# Patient Record
Sex: Male | Born: 1950
Health system: Southern US, Community
[De-identification: ages and names within clinical notes are randomized; demographics above are authoritative.]

## PROBLEM LIST (undated history)

## (undated) DIAGNOSIS — K219 Gastro-esophageal reflux disease without esophagitis: Secondary | ICD-10-CM

## (undated) DIAGNOSIS — N529 Male erectile dysfunction, unspecified: Secondary | ICD-10-CM

## (undated) DIAGNOSIS — I10 Essential (primary) hypertension: Secondary | ICD-10-CM

## (undated) DIAGNOSIS — E785 Hyperlipidemia, unspecified: Secondary | ICD-10-CM

## (undated) DIAGNOSIS — J302 Other seasonal allergic rhinitis: Secondary | ICD-10-CM

## (undated) DIAGNOSIS — G4730 Sleep apnea, unspecified: Secondary | ICD-10-CM

## (undated) DIAGNOSIS — E119 Type 2 diabetes mellitus without complications: Secondary | ICD-10-CM

## (undated) HISTORY — DX: Essential (primary) hypertension: I10

## (undated) HISTORY — DX: Hyperlipidemia, unspecified: E78.5

## (undated) HISTORY — PX: COLON SURGERY: SHX602

---

## 2000-09-09 ENCOUNTER — Encounter: Payer: Self-pay | Admitting: General Surgery

## 2000-09-09 ENCOUNTER — Inpatient Hospital Stay (HOSPITAL_COMMUNITY): Admission: EM | Admit: 2000-09-09 | Discharge: 2000-09-16 | Payer: Self-pay | Admitting: Emergency Medicine

## 2000-11-25 ENCOUNTER — Encounter: Payer: Self-pay | Admitting: General Surgery

## 2000-11-25 ENCOUNTER — Ambulatory Visit (HOSPITAL_COMMUNITY): Admission: RE | Admit: 2000-11-25 | Discharge: 2000-11-25 | Payer: Self-pay | Admitting: General Surgery

## 2001-03-10 ENCOUNTER — Encounter: Payer: Self-pay | Admitting: General Surgery

## 2001-03-10 ENCOUNTER — Ambulatory Visit (HOSPITAL_COMMUNITY): Admission: RE | Admit: 2001-03-10 | Discharge: 2001-03-10 | Payer: Self-pay | Admitting: General Surgery

## 2001-04-15 ENCOUNTER — Encounter: Payer: Self-pay | Admitting: General Surgery

## 2001-04-20 ENCOUNTER — Inpatient Hospital Stay (HOSPITAL_COMMUNITY): Admission: RE | Admit: 2001-04-20 | Discharge: 2001-04-28 | Payer: Self-pay | Admitting: General Surgery

## 2002-07-30 ENCOUNTER — Emergency Department (HOSPITAL_COMMUNITY): Admission: EM | Admit: 2002-07-30 | Discharge: 2002-07-30 | Payer: Self-pay | Admitting: Emergency Medicine

## 2002-07-30 ENCOUNTER — Encounter: Payer: Self-pay | Admitting: Emergency Medicine

## 2003-01-16 ENCOUNTER — Encounter: Admission: RE | Admit: 2003-01-16 | Discharge: 2003-04-16 | Payer: Self-pay | Admitting: Internal Medicine

## 2003-07-03 ENCOUNTER — Encounter: Admission: RE | Admit: 2003-07-03 | Discharge: 2003-10-01 | Payer: Self-pay | Admitting: Internal Medicine

## 2003-12-21 ENCOUNTER — Ambulatory Visit (HOSPITAL_COMMUNITY): Admission: RE | Admit: 2003-12-21 | Discharge: 2003-12-21 | Payer: Self-pay | Admitting: Internal Medicine

## 2006-03-26 ENCOUNTER — Ambulatory Visit (HOSPITAL_COMMUNITY): Admission: RE | Admit: 2006-03-26 | Discharge: 2006-03-26 | Payer: Self-pay | Admitting: Cardiology

## 2006-05-25 ENCOUNTER — Ambulatory Visit: Payer: Self-pay | Admitting: Pulmonary Disease

## 2006-07-16 ENCOUNTER — Ambulatory Visit: Payer: Self-pay | Admitting: Pulmonary Disease

## 2006-10-01 ENCOUNTER — Ambulatory Visit: Payer: Self-pay | Admitting: Vascular Surgery

## 2007-07-09 ENCOUNTER — Emergency Department (HOSPITAL_COMMUNITY): Admission: EM | Admit: 2007-07-09 | Discharge: 2007-07-09 | Payer: Self-pay | Admitting: Emergency Medicine

## 2010-04-21 ENCOUNTER — Emergency Department (HOSPITAL_COMMUNITY): Admission: EM | Admit: 2010-04-21 | Discharge: 2010-04-21 | Payer: Self-pay | Admitting: Emergency Medicine

## 2011-01-02 NOTE — Assessment & Plan Note (Signed)
[facility]                               PULMONARY OFFICE NOTE   Howard, Simmons                      MRN:          [id number]  DATE:05/25/2006                            DOB:          07-Jun-1951    HISTORY OF PRESENT ILLNESS:  I met with Howard Simmons today with his wife for  evaluation of his obstructive sleep apnea.  He apparently had undergone 2  overnight polysomnograms in August of 2007.  Per a copy of his report, his  initial diagnostic study showed an overall apnea/hypopnea index of 8.6 with  a REM apnea/hypopnea index of 19 and a supine apnea/hypopnea index of 80.  He subsequently underwent a C-PAP titration study.  It was titrated to a C-  PAP pressure setting of 8, but then apparently he now has had arrangements  to have a auto C-PAP titration study done.  He was initially referred for  his sleep study as part of an evaluation for chest discomfort as well as  daytime fatigue.  His current sleep pattern is that he will go to bed  between 8 and 10 o'clock at night.  He falls asleep fairly quickly, although  he wakes up 2-3 times in the night and he get up about 4:30 or 5 o'clock in  the morning.  He says he still feels quite tired during the day and he has  had instances where he actually felt sleepy while driving.  He also  complained of having  headache in the morning.  He has been told that he  snores at night as well as stops breathing while he is asleep.  He tends to  be more of a mouth breather and does get a dry mouth at night.  He has also  had instances in which he has actually fallen asleep while at work and then  actually falls asleep while eating dinner as well.  He drinks one cup of  coffee in the morning as well as one cup of coffee in the afternoon.  He has  been initiated on C-PAP therapy, using nasal pillows and a heated  humidifier.  He says that since they started on C-PAP he has noticed that  his sleep quality is  better at night and he has more energy during the day.  He denies any history of sleep walk or sleep talking, nightmares, night  terrors.  He also denies any history of sleep hallucinations, sleep  paralysis or cataplexy.  He is not currently using anything to help him fall  asleep at night.  He does complain of having occasional leg cramps.  About 2-  3 times per week he gets an odd feeling in his legs which happens on a  consistent basis while he is trying to lay flat to fall asleep.  He says  that if he moves his legs into different positions eventually these symptoms  will get better.   PAST MEDICAL HISTORY:  Otherwise is significant for atypical chest pain,  exertional dyspnea, and possible COPD, diabetes, hypertension,  hyperlipidemia, allergic rhinitis, anxiety and depression,  diverticulitis  with a rupture of the sigmoid colon resulting in temporary colostomy,  chronic left later epicondylitis, erectile dysfunction, chromomycosis, and a  history of alcohol abuse.  He has also had sinus surgery for nasal polyposis  and he has had knee surgery.   CURRENT MEDICATIONS:  1. __________  50 mg daily.  2. Zocor 40 mg daily.  3. Aciphex 20 mg daily.  4. Zyrtec 10 mg daily.  5. Citalopram 40 mg q.h.s.  6. Advair 250/50 1 puff b.i.d.  7. Flonase and Nasonex in addition to having a Proventil inhaler.   FAMILY HISTORY:  Significant for his father who had a brain tumor, mother  had heart disease.  His brother is diabetic.  His sister has breast cancer,  and he has a brother who has a history of alcohol abuse and cirrhosis.   SOCIAL HISTORY:  He is married.  He quit smoking approximately 10 years ago.  He smoked about 2 packs of cigarettes per day for 27 years.  He used to  drink heavily but quit approximately 26 years ago.  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   REVIEW OF SYSTEMS:  Essentially negative except for what is stated above.   PHYSICAL EXAMINATION:  He  is 6 feet 2 inches tall, weight is 230 pounds,  temperature is 98, blood pressure is 110/70, heart rate is 54, oxygen  saturation is 97% on room air.  HEENT:  Pupils are reactive.  Then is no sinus tenderness.  There is no  lymphadenopathy.  He has a Mallampati free airway.  HEART:  S1, S2 with a 2/6 systolic murmur.  CHEST:  Clear to auscultation.  ABDOMEN:  Obese, soft and tender.  EXTREMITIES:  No edema.  NEUROLOGIC:  There were no focal deficits appreciated.   IMPRESSION:  1. Mild obstructive sleep apnea.  Although he appeared to have a      significant REM as well as positional component, he does also have      significant symptoms suggestive of daytime sleepiness.  Given the fact      that he does also have a history of hypertension and diabetes, I do      feel that it would be warranted for him to undergo continued therapy      with C-PAP.  In the meantime, I discussed with him the importance of      diet, exercise, weight reduction as well as the influence of alcohol      and sedatives.  Driving precautions were discussed with him as well.  I      reviewed other treatment options for his sleep apnea, including oral      appliance and surgical intervention, however at this time I would      continue him on C-PAP therapy and monitor his compliance to this.  2. Symptoms suggestive of restless leg syndrome.  To further evaluate      this, I will have him undergo a laboratory assessment to check for his      serum ferritin level and if this is below 50 I would start him on iron      supplementation with vitamin C.  If this is normal, then what I would      recommend is consideration for possibly adding a dopamine agonist      medication to see if this helps with his leg symptoms and therefore      hopefully improve his quality of sleep  at night and functionality      during the day.  I plan on following up with him in approximately 6-     8 weeks to assess his tolerance of C-PAP  therapy.  Additionally, I will      try to obtain a copy of his auto C-PAP titration download from [facility].       Coralyn Helling, MD      VS/MedQ  DD:  05/31/2006  DT:  06/01/2006  Job #:  [id number]   cc:   Armanda Magic, M.D.

## 2011-01-02 NOTE — Assessment & Plan Note (Signed)
[facility]                             PULMONARY OFFICE NOTE   CONCEPCION, KIRKPATRICK                      MRN:          [id number]  DATE:07/16/2006                            DOB:          [birth year]    I saw Mr. Cerreta today in followup for his obstructive sleep apnea and  restless leg syndrome.   He appears to be doing quite well with regards to his sleep apnea.  He  is currently on CPAP of 8 with nasal pillows and heated humidification.  Since having proper fitting of his chin strap, his symptoms of mouth  dryness have improved considerably.  He no longer is snoring.  His sleep  quality is better, and he feels like he has more energy during the day.   With regards to his restless leg syndrome, he feels that the Requip has  helped this.  He is currently on 0.5 mg of Requip at night.  Of note, is  that he actually ran out of his medicine for about 1-1/2 weeks, and  during that time he noticed that his leg symptoms became significantly  worse.  He has since refilled his prescription, and continues to be on  the Requip at the present time.   Additionally, he had an influenza vaccination with his primary care  physician earlier this month.   CURRENT MEDICATIONS:  1. Tenormin 50 mg daily.  2. Simvastatin 20 mg daily.  3. AcipHex 20 mg daily.  4. Zyrtec 10 mg daily.  5. Citalopram 20 mg daily.  6. Advair 250/50 one puff b.i.d.  7. Flonase.  8. Aspirin 81 mg daily.  9. Requip 0.5 mg nightly.  10.Proventil p.r.n.   PHYSICAL EXAMINATION:  He weighs 239 pounds.  Temperature is 98.  Blood  pressure is 126/74.  Heart rate is 63.  Oxygen saturation is 97% on room  air.  HEENT:  There is no sinus tenderness.  No nasal discharge.  No oral  lesions.  HEART:  S1 and S2.  CHEST:  Clear to auscultation.  ABDOMEN:  Soft and non-tender.  EXTREMITIES:  No edema.   IMPRESSION:  1. Mild obstructive sleep apnea with a significant REM and positional  affect.  He is currently doing quite well on his CPAP set at 8 cm      of water with nasal pillow and a chin strap.  I have encouraged him      to maintain his compliance with CPAP therapy.  2. Restless leg syndrome.  He again, seems to have symptomatic benefit      with the use of Requip.  I advised him that he is a relatively low      dose of Requip, and that if his leg symptoms were to worsen, that      he could certainly try to increase his dose.  Otherwise, I would      continue him on Requip at 0.5 mg nightly.  3. I have given him samples of Nasonex, Advair and AcipHex from our      office today.  4. Followup will be  in 6 months.     Coralyn Helling, MD  Electronically Signed    VS/MedQ  DD: 07/16/2006  DT: 07/17/2006  Job #: [id number]   cc:   Armanda Magic, M.D.

## 2011-01-02 NOTE — Assessment & Plan Note (Signed)
[facility]                               PULMONARY OFFICE NOTE   MINAS, BONSER                      MRN:          [id number]  DATE:05/31/2006                            DOB:          1950/09/28    SLEEP MEDICINE NOTE   I have reviewed the laboratory results for Mr. Tal and, except for a  slight elevation in his glucose to 107 and a slight elevation in his AST to  43, the remainder of his labs were essentially normal.  I discussed these  results with his wife.  He is still having significant leg symptoms.  Therefore, I will initiate him on Requip starting at 0.25 mg nightly and  titrating the dose upwards as needed until he has either symptomatic  improvement in his legs or he starts noticing side effects.  I discussed the  possible side effects with his wife, and I have advised her that if he is  having any difficulties that he should call my office for further  discussion.  Otherwise, I will plan on seeing him back here in approximately  6 weeks.       Coralyn Helling, MD      VS/MedQ  DD:  05/31/2006  DT:  06/01/2006  Job #:  [id number]   cc:   Patrina Levering

## 2013-06-23 ENCOUNTER — Ambulatory Visit
Admission: RE | Admit: 2013-06-23 | Discharge: 2013-06-23 | Disposition: A | Discharge status: Home / Self Care | Payer: BC Managed Care – PPO | Source: Ambulatory Visit | Attending: Family Medicine | Admitting: Family Medicine

## 2013-06-23 ENCOUNTER — Other Ambulatory Visit: Payer: Self-pay | Admitting: Family Medicine

## 2013-06-23 DIAGNOSIS — M545 Low back pain, unspecified: Secondary | ICD-10-CM

## 2013-06-23 DIAGNOSIS — R634 Abnormal weight loss: Secondary | ICD-10-CM

## 2014-03-22 ENCOUNTER — Ambulatory Visit
Admission: RE | Admit: 2014-03-22 | Discharge: 2014-03-22 | Disposition: A | Discharge status: Home / Self Care | Payer: BC Managed Care – PPO | Source: Ambulatory Visit | Attending: Family Medicine | Admitting: Family Medicine

## 2014-03-22 ENCOUNTER — Other Ambulatory Visit: Payer: Self-pay | Admitting: Family Medicine

## 2014-03-22 DIAGNOSIS — M79672 Pain in left foot: Secondary | ICD-10-CM

## 2015-08-26 ENCOUNTER — Ambulatory Visit (INDEPENDENT_AMBULATORY_CARE_PROVIDER_SITE_OTHER): Payer: Worker's Compensation | Admitting: Family Medicine

## 2015-08-26 ENCOUNTER — Ambulatory Visit: Payer: Worker's Compensation

## 2015-08-26 VITALS — BP 116/80 | HR 94 | Temp 97.7°F | Resp 18 | Ht 71.5 in | Wt 224.8 lb

## 2015-08-26 DIAGNOSIS — S20229A Contusion of unspecified back wall of thorax, initial encounter: Secondary | ICD-10-CM

## 2015-08-26 DIAGNOSIS — M545 Low back pain, unspecified: Secondary | ICD-10-CM

## 2015-08-26 DIAGNOSIS — M546 Pain in thoracic spine: Secondary | ICD-10-CM | POA: Diagnosis not present

## 2015-08-26 MED ORDER — HYDROCODONE-ACETAMINOPHEN 5-325 MG PO TABS: 1.0000 | ORAL_TABLET | Freq: Four times a day (QID) | ORAL | Status: DC | PRN

## 2015-08-26 MED ORDER — CYCLOBENZAPRINE HCL 5 MG PO TABS: 5.0000 mg | ORAL_TABLET | Freq: Three times a day (TID) | ORAL | Status: DC | PRN

## 2015-08-26 MED ORDER — MELOXICAM 15 MG PO TABS: 15.0000 mg | ORAL_TABLET | Freq: Every day | ORAL | Status: DC

## 2015-08-26 NOTE — Patient Instructions (Signed)
Use mobic daily. Do not use any other products with this containing ibuprofen, naprosyn or aspirin. You MAY use tylenol with this. Use norco for breakthrough pain as needed. Flexeril three times a day as needed - this is the muscle relaxer. This will make you sleepy. Don't drive on this medication. Heat, gentle massage and gentle stretching can help. Remain active, as inactivity can cause more pain. Don't do anything too strenuous though. If you develop new numbness, weakness or incontinence of urine or bowel, return to clinic or go to the emergency room ASAP. Return in 1 week for follow up.

## 2015-08-26 NOTE — Progress Notes (Signed)
Patient ID: Howard Simmons, male   DOB: Feb 03, 1951, 65 y.o.   MRN: [id number] Reviewed documentation and xray and agree w/ assessment and plan. Delman Cheadle, MD MPH

## 2015-08-26 NOTE — Progress Notes (Signed)
[facility] [address] [place] [address] [phone]  Date:  08/26/2015   Name:  Howard Simmons   DOB:  [birth year]   MRN:  [id number]  PCP:  No primary care provider on file.    Chief Complaint: Back Pain and Chest Pain   History of Present Illness:  This is a 65 y.o. male who is presenting with back and chest pain after slipping and falling on ice today while at work. He states he landed right on his back. Did not hit his head. Pain mostly in thoracic back but in lower back as well. He states he tried to get up and keep working but after about an hour decided he needed to leave. He went home and took a left over hydrocodone from 2014 but states it did not help at all. He denies weakness, paresthesias, radiation of pain into his legs, problems with bowel/bladder. No prior back injuries. He states he has been told he has bone spurs in his back.   Review of Systems:  Review of Systems See HPI  There are no active problems to display for this patient.  Home meds reviewed  No Known Allergies   Medication list has been reviewed and updated.  Physical Examination:  Physical Exam  Constitutional: He is oriented to person, place, and time. He appears well-developed and well-nourished. No distress.  HENT:  Head: Normocephalic and atraumatic.  Right Ear: Hearing normal.  Left Ear: Hearing normal.  Nose: Nose normal.  Eyes: Conjunctivae and lids are normal. Right eye exhibits no discharge. Left eye exhibits no discharge. No scleral icterus.  Cardiovascular: Normal rate, regular rhythm, normal heart sounds and normal pulses.   No murmur heard. Pulmonary/Chest: Effort normal and breath sounds normal. No respiratory distress. He has no wheezes. He has no rhonchi. He has no rales. He exhibits tenderness (mild, anterior).  Abdominal: Soft. Normal appearance. There is no tenderness.  Musculoskeletal: Normal range of motion.       Thoracic back: He exhibits  tenderness and bony tenderness (see depiction). He exhibits normal range of motion and no swelling.       Lumbar back: He exhibits tenderness (right paraspinal). He exhibits normal range of motion and no bony tenderness.       Back:  Negative straight leg raise  Neurological: He is alert and oriented to person, place, and time. He has normal strength and normal reflexes. No sensory deficit. Gait normal.  Reflex Scores:      Patellar reflexes are 2+ on the right side and 2+ on the left side.      Achilles reflexes are 2+ on the right side and 2+ on the left side. Skin: Skin is warm, dry and intact. No lesion and no rash noted.  Psychiatric: He has a normal mood and affect. His speech is normal and behavior is normal. Thought content normal.   BP 116/80 mmHg  Pulse 94  Temp(Src) 97.7 F (36.5 C) (Oral)  Resp 18  Ht 5' 11.5" (1.816 m)  Wt 224 lb 12.8 oz (101.969 kg)  BMI 30.92 kg/m2  SpO2 97%  [facility] reading (PRIMARY) by  Dr. Brigitte Pulse: degenerative changes, no acute changes.  Assessment and Plan:  1. Back contusion, unspecified laterality, initial encounter 2. Thoracic back pain 3. Low back pain without sciatica Radiographs without any acute abnormalities. Will treat for contusion/muscle spasm. Treat with mobic daily, flexeril TID prn and gave new rx of norco to take for breakthrough  pain. Counseled on heat, gentle massage and activity modification. He will remain OOW for next 1 week. Return on 09/02/15 for follow up. - cyclobenzaprine (FLEXERIL) 5 MG tablet; Take 1 tablet (5 mg total) by mouth 3 (three) times daily as needed for muscle spasms.  Dispense: 30 tablet; Refill: 0 - meloxicam (MOBIC) 15 MG tablet; Take 1 tablet (15 mg total) by mouth daily.  Dispense: 30 tablet; Refill: 0 - HYDROcodone-acetaminophen (NORCO) 5-325 MG tablet; Take 1 tablet by mouth every 6 (six) hours as needed.  Dispense: 15 tablet; Refill: 0 - DG Lumbar Spine Complete; Future - DG Thoracic Spine 2 View;  Future   [name]. Drenda Freeze, MHS [facility]  08/26/2015

## 2015-09-02 ENCOUNTER — Ambulatory Visit (INDEPENDENT_AMBULATORY_CARE_PROVIDER_SITE_OTHER): Payer: Worker's Compensation | Admitting: Physician Assistant

## 2015-09-02 VITALS — BP 130/80 | HR 101 | Temp 98.0°F | Resp 16 | Ht 71.0 in | Wt 224.8 lb

## 2015-09-02 DIAGNOSIS — M546 Pain in thoracic spine: Secondary | ICD-10-CM

## 2015-09-02 DIAGNOSIS — M545 Low back pain, unspecified: Secondary | ICD-10-CM

## 2015-09-02 NOTE — Progress Notes (Signed)
  Medical screening examination/treatment/procedure(s) were performed by non-physician practitioner and as supervising physician I was immediately available for consultation/collaboration.     

## 2015-09-02 NOTE — Progress Notes (Signed)
   Howard Simmons  MRN: [id number] DOB: 23-Feb-1951  Subjective:  Pt presents to clinic for workers comp recheck.  Howard Simmons feels much better - his back is only slightly stiff.  Howard Simmons is ready to go back to work.  No paresthesias or pain radiation.  No weakness.  There are no active problems to display for this patient.   Current Outpatient Prescriptions on File Prior to Visit  Medication Sig Dispense Refill  . atorvastatin (LIPITOR) 10 MG tablet Take 10 mg by mouth daily.    . cyclobenzaprine (FLEXERIL) 5 MG tablet Take 1 tablet (5 mg total) by mouth 3 (three) times daily as needed for muscle spasms. 30 tablet 0  . HYDROcodone-acetaminophen (NORCO) 5-325 MG tablet Take 1 tablet by mouth every 6 (six) hours as needed. 15 tablet 0  . lisinopril (PRINIVIL,ZESTRIL) 5 MG tablet Take 5 mg by mouth daily.    . meloxicam (MOBIC) 15 MG tablet Take 1 tablet (15 mg total) by mouth daily. 30 tablet 0   No current facility-administered medications on file prior to visit.    No Known Allergies  Review of Systems  Musculoskeletal: Positive for back pain (improved). Negative for gait problem.   Objective:  BP 130/80 mmHg  Pulse 101  Temp(Src) 98 F (36.7 C) (Oral)  Resp 16  Ht 5\' 11"  (1.803 m)  Wt 224 lb 12.8 oz (101.969 kg)  BMI 31.37 kg/m2  SpO2 96%  Physical Exam  Constitutional: Howard Simmons is oriented to person, place, and time and well-developed, well-nourished, and in no distress.  HENT:  Head: Normocephalic and atraumatic.  Right Ear: External ear normal.  Left Ear: External ear normal.  Eyes: Conjunctivae are normal.  Neck: Normal range of motion.  Pulmonary/Chest: Effort normal.  Musculoskeletal:       Lumbar back: Howard Simmons exhibits normal range of motion, no tenderness, no pain and no spasm.  Neurological: Howard Simmons is alert and oriented to person, place, and time. Howard Simmons has normal sensation, normal strength and normal reflexes. Howard Simmons displays no weakness. Howard Simmons has a normal Straight Leg Raise Test. Gait normal.  Gait normal.  Reflex Scores:      Patellar reflexes are 2+ on the right side and 2+ on the left side.      Achilles reflexes are 2+ on the right side and 2+ on the left side. Skin: Skin is warm and dry.  Psychiatric: Mood, memory, affect and judgment normal.    Assessment and Plan :  Midline thoracic back pain - Plan: Care order/instruction  Midline low back pain without sciatica   Injury from slipping on the ice has improved and patient is ready to go back to work without restrictions.  Howard Simmons has been out of work so we will recheck him in a week after Howard Simmons has returned to his full duties at work.  Howard Simmons will use the medication as needed.  Windell Hummingbird PA-C  [facility] 09/02/2015 9:06 AM

## 2015-09-02 NOTE — Patient Instructions (Signed)
Heat to the area after work Medications as needed Recheck in 7-10 days

## 2015-09-09 ENCOUNTER — Ambulatory Visit (INDEPENDENT_AMBULATORY_CARE_PROVIDER_SITE_OTHER): Payer: Worker's Compensation | Admitting: Physician Assistant

## 2015-09-09 VITALS — BP 118/82 | HR 88 | Temp 98.2°F | Resp 16 | Ht 72.0 in | Wt 227.0 lb

## 2015-09-09 DIAGNOSIS — S20229D Contusion of unspecified back wall of thorax, subsequent encounter: Secondary | ICD-10-CM

## 2015-09-09 NOTE — Progress Notes (Signed)
Howard Simmons [birth year] 65 y.o.   Chief Complaint  Patient presents with  . Follow-up    W/C back pain      Date of Injury: 08/26/2015  History of Present Illness:  Presents for evaluation of work-related complaint. This patient presents for follow-up of a WC injury, after falling on ice and sustaining contusion to the chest wall/thoracic back.  He was initially out of work, and returned one week ago to regular duty. He is able to do the work without difficulty. Sometimes feels stiff, sore. Uses acetaminophen PRN.  No loss of B/B control. No radicular symptoms. No weakness in the extremities.   ROS As above.   No Known Allergies   Current medications reviewed and updated. Past medical history, family history, social history have been reviewed and updated.   Physical Exam  Constitutional: He is well-developed, well-nourished, and in no distress. No distress.  BP 118/82 mmHg  Pulse 88  Temp(Src) 98.2 F (36.8 C) (Oral)  Resp 16  Ht 6' (1.829 m)  Wt 227 lb (102.967 kg)  BMI 30.78 kg/m2  SpO2 95%   HENT:  Head: Normocephalic and atraumatic.  Eyes: Conjunctivae are normal. No scleral icterus.  Cardiovascular: Normal rate, regular rhythm and normal heart sounds.   Pulmonary/Chest: Effort normal and breath sounds normal. He exhibits tenderness (mild, LEFT-sided).  Musculoskeletal:       Cervical back: Normal.       Thoracic back: Normal.       Lumbar back: Normal.  Skin: Skin is warm and dry. He is not diaphoretic.  Psychiatric: Mood and affect normal.  speech is normal. Behavior appropriate.     Assessment and Plan: 1. Back contusion, unspecified laterality, subsequent encounter RTW now, without restrictions. Continue PRN acetaminophen. Return PRN.   Fara Chute, PA-C Physician Assistant-Certified [facility]

## 2015-09-09 NOTE — Progress Notes (Signed)
   Subjective:    Patient ID: Howard Simmons, male    DOB: [birth year], 65 y.o.   MRN: [id number]  Chief Complaint  Patient presents with  . Follow-up    W/C back pain     HPI Patient presents today for a follow up for back pain. The patient initial presented on 08/26/15 after slipping and falling on ice at work. He was cleared to go back to work on 09/02/15. Since returning to work he reports he is doing a lot better, maybe 90%, but does not feel restricted at work. He reports that he believes he is on the road to recovery. The pain is scaled at a 2 or 3/10. He was at work this morning and came in because he was told to come in for a f/u. Patient says that he has used all of past medication rx and is now taking a tylenol on occasion for the pain.  Review of Systems  Constitutional: Negative for fever.  Cardiovascular: Negative for chest pain.  Musculoskeletal: Positive for back pain (improving). Negative for myalgias, joint swelling, arthralgias, gait problem, neck pain and neck stiffness.  Neurological: Negative for numbness.    Current medications reviewed and updated.  Past medical history, family history, social history have been reviewed and updated.      Objective:   Physical Exam  Constitutional: Vital signs are normal. He appears well-developed and well-nourished. No distress.  BP 118/82 mmHg  Pulse 88  Temp(Src) 98.2 F (36.8 C) (Oral)  Resp 16  Ht 6' (1.829 m)  Wt 227 lb (102.967 kg)  BMI 30.78 kg/m2  SpO2 95%   HENT:  Head: Normocephalic and atraumatic.  Right Ear: Hearing and external ear normal.  Left Ear: Hearing and external ear normal.  Nose: Nose normal.  Eyes: Conjunctivae and lids are normal. Pupils are equal, round, and reactive to light.  Neck: Trachea normal. Neck supple.  Cardiovascular: Normal rate, regular rhythm, S1 normal, S2 normal, normal heart sounds and normal pulses.  Exam reveals no gallop and no friction rub.   No murmur  heard. Pulmonary/Chest: Effort normal and breath sounds normal.  Musculoskeletal:       Cervical back: Normal.       Thoracic back: Normal.       Lumbar back: Normal.  Neurological: He is alert. He has normal reflexes.  Reflex Scores:      Bicep reflexes are 2+ on the right side and 2+ on the left side.      Brachioradialis reflexes are 2+ on the right side and 2+ on the left side.      Patellar reflexes are 2+ on the right side and 2+ on the left side.      Achilles reflexes are 2+ on the right side and 2+ on the left side. Skin: Skin is warm and dry. No ecchymosis noted. No erythema.  Vitals reviewed.         Assessment & Plan:  1. Back contusion, unspecified laterality, subsequent encounter May return to work now with no restrictions. Continue PRN Tylenol. No f/u

## 2015-11-20 ENCOUNTER — Encounter (HOSPITAL_COMMUNITY): Payer: Self-pay | Admitting: *Deleted

## 2015-11-20 ENCOUNTER — Other Ambulatory Visit: Payer: Self-pay | Admitting: Gastroenterology

## 2015-12-02 ENCOUNTER — Ambulatory Visit (HOSPITAL_COMMUNITY)
Admission: RE | Admit: 2015-12-02 | Discharge: 2015-12-02 | Disposition: A | Discharge status: Home / Self Care | Payer: BLUE CROSS/BLUE SHIELD | Source: Ambulatory Visit | Attending: Gastroenterology | Admitting: Gastroenterology

## 2015-12-02 ENCOUNTER — Encounter (HOSPITAL_COMMUNITY): Payer: Self-pay

## 2015-12-02 ENCOUNTER — Encounter (HOSPITAL_COMMUNITY)
Admission: RE | Disposition: A | Discharge status: Home / Self Care | Payer: Self-pay | Source: Ambulatory Visit | Attending: Gastroenterology

## 2015-12-02 ENCOUNTER — Ambulatory Visit (HOSPITAL_COMMUNITY): Payer: BLUE CROSS/BLUE SHIELD | Admitting: Anesthesiology

## 2015-12-02 DIAGNOSIS — Z7984 Long term (current) use of oral hypoglycemic drugs: Secondary | ICD-10-CM | POA: Insufficient documentation

## 2015-12-02 DIAGNOSIS — Z87891 Personal history of nicotine dependence: Secondary | ICD-10-CM | POA: Insufficient documentation

## 2015-12-02 DIAGNOSIS — Z79899 Other long term (current) drug therapy: Secondary | ICD-10-CM | POA: Diagnosis not present

## 2015-12-02 DIAGNOSIS — I10 Essential (primary) hypertension: Secondary | ICD-10-CM | POA: Insufficient documentation

## 2015-12-02 DIAGNOSIS — E119 Type 2 diabetes mellitus without complications: Secondary | ICD-10-CM | POA: Insufficient documentation

## 2015-12-02 DIAGNOSIS — Z1211 Encounter for screening for malignant neoplasm of colon: Secondary | ICD-10-CM | POA: Diagnosis present

## 2015-12-02 DIAGNOSIS — G4733 Obstructive sleep apnea (adult) (pediatric): Secondary | ICD-10-CM | POA: Diagnosis not present

## 2015-12-02 DIAGNOSIS — K219 Gastro-esophageal reflux disease without esophagitis: Secondary | ICD-10-CM | POA: Diagnosis not present

## 2015-12-02 HISTORY — DX: Gastro-esophageal reflux disease without esophagitis: K21.9

## 2015-12-02 HISTORY — PX: COLONOSCOPY WITH PROPOFOL: SHX5780

## 2015-12-02 LAB — GLUCOSE, CAPILLARY: Glucose-Capillary: 86 mg/dL (ref 65–99)

## 2015-12-02 SURGERY — COLONOSCOPY WITH PROPOFOL
Anesthesia: Monitor Anesthesia Care

## 2015-12-02 MED ORDER — PROPOFOL 10 MG/ML IV BOLUS
INTRAVENOUS | Status: DC | PRN
  Administered 2015-12-02: 50 mg via INTRAVENOUS
  Administered 2015-12-02: 60 mg via INTRAVENOUS

## 2015-12-02 MED ORDER — LACTATED RINGERS IV SOLN
INTRAVENOUS | Status: DC
  Administered 2015-12-02: 08:00:00 via INTRAVENOUS

## 2015-12-02 MED ORDER — PROPOFOL 10 MG/ML IV BOLUS
INTRAVENOUS | Status: AC
  Filled 2015-12-02: qty 20

## 2015-12-02 MED ORDER — LIDOCAINE HCL (CARDIAC) 20 MG/ML IV SOLN
INTRAVENOUS | Status: AC
  Filled 2015-12-02: qty 5

## 2015-12-02 MED ORDER — PROPOFOL 10 MG/ML IV BOLUS
INTRAVENOUS | Status: AC
  Filled 2015-12-02: qty 40

## 2015-12-02 MED ORDER — LIDOCAINE HCL (CARDIAC) 20 MG/ML IV SOLN
INTRAVENOUS | Status: DC | PRN
  Administered 2015-12-02: 60 mg via INTRAVENOUS

## 2015-12-02 MED ORDER — SODIUM CHLORIDE 0.9 % IV SOLN: INTRAVENOUS | Status: DC

## 2015-12-02 MED ORDER — PROPOFOL 500 MG/50ML IV EMUL
INTRAVENOUS | Status: DC | PRN
  Administered 2015-12-02: 200 ug/kg/min via INTRAVENOUS

## 2015-12-02 MED ORDER — PHENYLEPHRINE HCL 10 MG/ML IJ SOLN
INTRAMUSCULAR | Status: DC | PRN
  Administered 2015-12-02 (×2): 80 ug via INTRAVENOUS
  Administered 2015-12-02: 40 ug via INTRAVENOUS
  Administered 2015-12-02 (×2): 80 ug via INTRAVENOUS

## 2015-12-02 MED ORDER — PHENYLEPHRINE 40 MCG/ML (10ML) SYRINGE FOR IV PUSH (FOR BLOOD PRESSURE SUPPORT)
PREFILLED_SYRINGE | INTRAVENOUS | Status: AC
  Filled 2015-12-02: qty 10

## 2015-12-02 SURGICAL SUPPLY — 22 items

## 2015-12-02 NOTE — Anesthesia Postprocedure Evaluation (Signed)
Anesthesia Post Note  Patient: Howard Simmons  Procedure(s) Performed: Procedure(s) (LRB): COLONOSCOPY WITH PROPOFOL (N/A)  Patient location during evaluation: PACU Anesthesia Type: MAC Level of consciousness: awake and alert Pain management: pain level controlled Vital Signs Assessment: post-procedure vital signs reviewed and stable Respiratory status: spontaneous breathing, nonlabored ventilation, respiratory function stable and patient connected to nasal cannula oxygen Cardiovascular status: blood pressure returned to baseline and stable Postop Assessment: no signs of nausea or vomiting Anesthetic complications: no    Last Vitals:  Filed Vitals:   12/02/15 1010 12/02/15 1020  BP: 107/67 127/79  Pulse: 72 71  Temp:    Resp: 14 20    Last Pain: There were no vitals filed for this visit.               Joline Encalada L

## 2015-12-02 NOTE — H&P (Signed)
  Procedure: Screening colonoscopy. Normal baseline screening colonoscopy was performed on 03/18/2005  History: The patient is a 65 year old male born in [birth year]. He is scheduled to undergo a repeat screening colonoscopy today  Past medical history: Type 2 diabetes mellitus. Hypertension. Hypercholesterolemia. Anxiety with depression. Seasonal allergies. Mild obstructive sleep apnea syndrome. Hearing loss. Appendectomy. Segmental left colon resection to treat perforated diverticulitis in 2001. Right cataract surgery. Arthroscopic right knee surgery. Nasal surgery.  Exam: The patient is alert and lying comfortably on the endoscopy stretcher. Abdomen is soft and nontender to palpation. Lungs are clear to auscultation. Cardiac exam reveals a regular rhythm.  Plan: Proceed with screening colonoscopy

## 2015-12-02 NOTE — Discharge Instructions (Signed)

## 2015-12-02 NOTE — Anesthesia Preprocedure Evaluation (Addendum)
Anesthesia Evaluation  Patient identified by MRN, date of birth, ID band Patient awake    Reviewed: Allergy & Precautions, H&P , NPO status , Patient's Chart, lab work & pertinent test results  Airway Mallampati: II  TM Distance: >3 FB Neck ROM: full    Dental  (+) Dental Advisory Given, Teeth Intact   Pulmonary neg pulmonary ROS, former smoker,    Pulmonary exam normal breath sounds clear to auscultation       Cardiovascular Exercise Tolerance: Good hypertension, Pt. on medications Normal cardiovascular exam Rhythm:regular Rate:Normal     Neuro/Psych negative neurological ROS  negative psych ROS   GI/Hepatic negative GI ROS, Neg liver ROS, GERD  Medicated and Controlled,  Endo/Other  negative endocrine ROSdiabetes, Well Controlled, Type 2, Oral Hypoglycemic Agents  Renal/GU negative Renal ROS  negative genitourinary   Musculoskeletal   Abdominal   Peds  Hematology negative hematology ROS (+)   Anesthesia Other Findings   Reproductive/Obstetrics negative OB ROS                           Anesthesia Physical Anesthesia Plan  ASA: III  Anesthesia Plan: MAC   Post-op Pain Management:    Induction:   Airway Management Planned:   Additional Equipment:   Intra-op Plan:   Post-operative Plan:   Informed Consent: I have reviewed the patients History and Physical, chart, labs and discussed the procedure including the risks, benefits and alternatives for the proposed anesthesia with the patient or authorized representative who has indicated his/her understanding and acceptance.   Dental Advisory Given  Plan Discussed with: CRNA and Surgeon  Anesthesia Plan Comments:         Anesthesia Quick Evaluation

## 2015-12-02 NOTE — Op Note (Signed)
[facility] Patient Name: Howard Simmons Procedure Date: 12/02/2015 MRN: [id number] Attending MD: Garlan Fair , MD Date of Birth: 01/31/1951 CSN:  Age: 65 Admit Type: Outpatient Procedure:                Colonoscopy Indications:              Screening for colorectal malignant neoplasm.                            Segmental left colon resection to treat perforated                            diverticulitis in 2001. Providers:                Garlan Fair, MD, Laverta Baltimore, RN, Despina Pole, Technician, Ralene Bathe, Technician,                            Noel Christmas Referring MD:              Medicines:                Propofol per Anesthesia Complications:            No immediate complications. Estimated Blood Loss:     Estimated blood loss: none. Procedure:                Pre-Anesthesia Assessment:                           - Prior to the procedure, a History and Physical                            was performed, and patient medications and                            allergies were reviewed. The patient's tolerance of                            previous anesthesia was also reviewed. The risks                            and benefits of the procedure and the sedation                            options and risks were discussed with the patient.                            All questions were answered, and informed consent                            was obtained. Prior Anticoagulants: The patient has                            taken aspirin, last dose was  day of procedure. ASA                            Grade Assessment: III - A patient with severe                            systemic disease. After reviewing the risks and                            benefits, the patient was deemed in satisfactory                            condition to undergo the procedure.                           After obtaining informed consent, the colonoscope                             was passed under direct vision. Throughout the                            procedure, the patient's blood pressure, pulse, and                            oxygen saturations were monitored continuously. The                            EC-[id number]) scope was introduced through                            the anus and advanced to the the cecum, identified                            by appendiceal orifice and ileocecal valve. The                            colonoscopy was performed without difficulty. The                            patient tolerated the procedure well. The quality                            of the bowel preparation was good. The appendiceal                            orifice and the rectum were photographed. Scope In: 9:37:43 AM Scope Out: 10:01:42 AM Scope Withdrawal Time: 0 hours 18 minutes 56 seconds  Total Procedure Duration: 0 hours 23 minutes 59 seconds  Findings:      The perianal and digital rectal examinations were normal.      The entire examined colon appeared normal. Impression:               - The entire examined colon is normal.                           -  No specimens collected. Moderate Sedation:      N/A- Per Anesthesia Care Recommendation:           - Patient has a contact number available for                            emergencies. The signs and symptoms of potential                            delayed complications were discussed with the                            patient. Return to normal activities tomorrow.                            Written discharge instructions were provided to the                            patient.                           - Repeat colonoscopy in 10 years for screening                            purposes.                           - Resume previous diet.                           - Continue present medications. Procedure Code(s):        --- Professional ---                           [id number],  Colonoscopy, flexible; diagnostic, including                            collection of specimen(s) by brushing or washing,                            when performed (separate procedure) Diagnosis Code(s):        --- Professional ---                           Z12.11, Encounter for screening for malignant                            neoplasm of colon CPT copyright 2016 [employer]. All rights reserved. The codes documented in this report are preliminary and upon coder review may  be revised to meet current compliance requirements. [name], MD Garlan Fair, MD 12/02/2015 10:07:45 AM This report has been signed electronically. Number of Addenda: 0

## 2015-12-02 NOTE — Transfer of Care (Signed)
Immediate Anesthesia Transfer of Care Note  Patient: Howard Simmons  Procedure(s) Performed: Procedure(s): COLONOSCOPY WITH PROPOFOL (N/A)  Patient Location: PACU  Anesthesia Type:MAC  Level of Consciousness:  sedated, patient cooperative and responds to stimulation  Airway & Oxygen Therapy:Patient Spontanous Breathing and Patient connected to face mask oxgen  Post-op Assessment:  Report given to PACU RN and Post -op Vital signs reviewed and stable  Post vital signs:  Reviewed and stable  Last Vitals:  Filed Vitals:   12/02/15 0755 12/02/15 1007  BP: 149/71 103/49  Pulse: 77 72  Temp: 36.7 C   Resp: 12 15    Complications: No apparent anesthesia complications

## 2015-12-03 ENCOUNTER — Encounter (HOSPITAL_COMMUNITY): Payer: Self-pay | Admitting: Gastroenterology

## 2017-08-04 DIAGNOSIS — M79645 Pain in left finger(s): Secondary | ICD-10-CM | POA: Diagnosis not present

## 2017-08-04 DIAGNOSIS — M79644 Pain in right finger(s): Secondary | ICD-10-CM | POA: Diagnosis not present

## 2017-08-04 DIAGNOSIS — M180 Bilateral primary osteoarthritis of first carpometacarpal joints: Secondary | ICD-10-CM | POA: Diagnosis not present

## 2017-10-13 DIAGNOSIS — J209 Acute bronchitis, unspecified: Secondary | ICD-10-CM | POA: Diagnosis not present

## 2017-11-19 DIAGNOSIS — I10 Essential (primary) hypertension: Secondary | ICD-10-CM | POA: Diagnosis not present

## 2017-11-19 DIAGNOSIS — H5202 Hypermetropia, left eye: Secondary | ICD-10-CM | POA: Diagnosis not present

## 2017-11-19 DIAGNOSIS — H35372 Puckering of macula, left eye: Secondary | ICD-10-CM | POA: Diagnosis not present

## 2017-11-19 DIAGNOSIS — H2512 Age-related nuclear cataract, left eye: Secondary | ICD-10-CM | POA: Diagnosis not present

## 2017-11-19 DIAGNOSIS — E119 Type 2 diabetes mellitus without complications: Secondary | ICD-10-CM | POA: Diagnosis not present

## 2017-11-19 DIAGNOSIS — H52223 Regular astigmatism, bilateral: Secondary | ICD-10-CM | POA: Diagnosis not present

## 2017-11-19 DIAGNOSIS — Z961 Presence of intraocular lens: Secondary | ICD-10-CM | POA: Diagnosis not present

## 2017-12-06 DIAGNOSIS — M180 Bilateral primary osteoarthritis of first carpometacarpal joints: Secondary | ICD-10-CM | POA: Diagnosis not present

## 2017-12-06 DIAGNOSIS — M79644 Pain in right finger(s): Secondary | ICD-10-CM | POA: Diagnosis not present

## 2017-12-06 DIAGNOSIS — M25542 Pain in joints of left hand: Secondary | ICD-10-CM | POA: Insufficient documentation

## 2017-12-06 DIAGNOSIS — M79645 Pain in left finger(s): Secondary | ICD-10-CM | POA: Diagnosis not present

## 2017-12-06 DIAGNOSIS — M25541 Pain in joints of right hand: Secondary | ICD-10-CM | POA: Insufficient documentation

## 2017-12-06 DIAGNOSIS — M189 Osteoarthritis of first carpometacarpal joint, unspecified: Secondary | ICD-10-CM | POA: Insufficient documentation

## 2017-12-17 DIAGNOSIS — Z23 Encounter for immunization: Secondary | ICD-10-CM | POA: Diagnosis not present

## 2017-12-17 DIAGNOSIS — J301 Allergic rhinitis due to pollen: Secondary | ICD-10-CM | POA: Diagnosis not present

## 2017-12-17 DIAGNOSIS — E559 Vitamin D deficiency, unspecified: Secondary | ICD-10-CM | POA: Diagnosis not present

## 2017-12-17 DIAGNOSIS — I10 Essential (primary) hypertension: Secondary | ICD-10-CM | POA: Diagnosis not present

## 2017-12-17 DIAGNOSIS — E785 Hyperlipidemia, unspecified: Secondary | ICD-10-CM | POA: Diagnosis not present

## 2017-12-17 DIAGNOSIS — R69 Illness, unspecified: Secondary | ICD-10-CM | POA: Diagnosis not present

## 2017-12-17 DIAGNOSIS — E1169 Type 2 diabetes mellitus with other specified complication: Secondary | ICD-10-CM | POA: Diagnosis not present

## 2018-01-19 ENCOUNTER — Ambulatory Visit (INDEPENDENT_AMBULATORY_CARE_PROVIDER_SITE_OTHER): Payer: Medicare HMO | Admitting: Allergy and Immunology

## 2018-01-19 ENCOUNTER — Encounter: Payer: Self-pay | Admitting: Allergy and Immunology

## 2018-01-19 VITALS — BP 122/84 | HR 72 | Temp 98.3°F | Resp 18 | Ht 70.0 in | Wt 224.0 lb

## 2018-01-19 DIAGNOSIS — J3089 Other allergic rhinitis: Secondary | ICD-10-CM

## 2018-01-19 DIAGNOSIS — J4540 Moderate persistent asthma, uncomplicated: Secondary | ICD-10-CM

## 2018-01-19 DIAGNOSIS — J4520 Mild intermittent asthma, uncomplicated: Secondary | ICD-10-CM | POA: Diagnosis not present

## 2018-01-19 DIAGNOSIS — K219 Gastro-esophageal reflux disease without esophagitis: Secondary | ICD-10-CM | POA: Diagnosis not present

## 2018-01-19 MED ORDER — BUDESONIDE-FORMOTEROL FUMARATE 160-4.5 MCG/ACT IN AERO: 2.0000 | INHALATION_SPRAY | Freq: Two times a day (BID) | RESPIRATORY_TRACT | 5 refills | Status: DC

## 2018-01-19 MED ORDER — MONTELUKAST SODIUM 10 MG PO TABS: 10.0000 mg | ORAL_TABLET | Freq: Every day | ORAL | 5 refills | Status: DC

## 2018-01-19 MED ORDER — OMEPRAZOLE MAGNESIUM 20 MG PO TBEC: 40.0000 mg | DELAYED_RELEASE_TABLET | Freq: Two times a day (BID) | ORAL | 5 refills | Status: DC

## 2018-01-19 NOTE — Patient Instructions (Addendum)
  1. Use the following plan for the next two weeks:   A. Symbicort 160 - 2 inhalations two times per day with spacer  B. Flonase - 1 spray each nostril two times per day  C. Montelukast 10mg  - 1 tablet one time per day  D. Increase omeprazole to 40mg  two times per day  2. If needed:   A. Proair HFA 2 inhalations every 4-6 hours  B. OTC antihistamine  C. Azelastine - 2- sprays each nostril 2 times per day  D. OTC Mucinex DM - 2 tablets two times per day  3. Use the plan above if symptoms return in the future  4. Further evaluation? Lisinopril?

## 2018-01-19 NOTE — Progress Notes (Signed)
NEW PATIENT NOTE  Referring Provider: No ref. provider found Primary Provider: Harlan Stains, MD Date of office visit: 01/19/2018    Subjective:   Chief Complaint:  Howard Simmons (DOB: 11-Apr-1951) is a 67 y.o. male who presents to the clinic on 01/19/2018 with a chief complaint of Hoarse and Breathing Problem .  HPI: Howard Simmons presents to this clinic in evaluation of respiratory tract symptoms that developed the spring.  Apparently I had seen him in this clinic 15 years ago for a issue with his respiratory tract.  Apparently he was doing very well but sometime in April he developed an "spell" and sometime in May he developed a "spell" that were very similar and presented with shortness of breath and coughing and stuffy nose and sneezing and clear rhinorrhea and very bad laryngitis.  He also developed sternal chest pain and gagging and retching and posttussive emesis.  He was treated with a Z-Pak and steroids and he is better at this point in time.  He still is a little bit hoarse and still has some cough and he still is a bit stuffy but he has no gagging or retching or ugly nasal discharge or anosmia or other respiratory tract symptoms.  When he uses a short acting bronchodilator it does appear to help him somewhat.  There was no obvious provoking factor giving rise to this issue.  He did not have a fever or chills or other constitutional symptoms and had no associated systemic symptoms.  He states that he developed an episode of "bronchitis" in the winter 2019 but otherwise has gone several years without any problems with his respiratory tract and without the use of any medications.  He did smoke tobacco for approximately 20 years but he has been tobacco free for at least 30 years.  Past Medical History:  Diagnosis Date  . GERD (gastroesophageal reflux disease)   . Hyperlipidemia   . Hypertension     Past Surgical History:  Procedure Laterality Date  . COLON SURGERY    .  COLONOSCOPY WITH PROPOFOL N/A 12/02/2015   Procedure: COLONOSCOPY WITH PROPOFOL;  Surgeon: Garlan Fair, MD;  Location: [facility];  Service: Endoscopy;  Laterality: N/A;    Allergies as of 01/19/2018   No Known Allergies     Medication List      aspirin EC 81 MG tablet Take 81 mg by mouth daily.   atorvastatin 10 MG tablet Commonly known as:  LIPITOR Take 10 mg by mouth daily.   azelastine 0.1 % nasal spray Commonly known as:  ASTELIN Place 1 spray into both nostrils daily.   cetirizine 10 MG tablet Commonly known as:  ZYRTEC Take 10 mg by mouth daily.   DULoxetine 60 MG capsule Commonly known as:  CYMBALTA duloxetine 60 mg capsule,delayed release   fluticasone 50 MCG/ACT nasal spray Commonly known as:  FLONASE Place 1 spray into both nostrils daily.   lisinopril 5 MG tablet Commonly known as:  PRINIVIL,ZESTRIL Take 5 mg by mouth every morning.   metFORMIN 500 MG tablet Commonly known as:  GLUCOPHAGE Take 500 mg by mouth daily with breakfast.   naproxen 500 MG tablet Commonly known as:  NAPROSYN   omeprazole 20 MG tablet Commonly known as:  PRILOSEC OTC Take 20 mg by mouth daily.   PROAIR HFA 108 (90 Base) MCG/ACT inhaler Generic drug:  albuterol Inhale 1-2 puffs into the lungs every 6 (six) hours as needed for wheezing or shortness of breath.  Vitamin D-3 1000 units Caps Take 2,000 Units by mouth daily.       Review of systems negative except as noted in HPI / PMHx or noted below:  Review of Systems  Constitutional: Negative.   HENT: Negative.   Eyes: Negative.   Respiratory: Negative.   Cardiovascular: Negative.   Gastrointestinal: Negative.   Genitourinary: Negative.   Musculoskeletal: Negative.   Skin: Negative.   Neurological: Negative.   Endo/Heme/Allergies: Negative.   Psychiatric/Behavioral: Negative.     Family History  Problem Relation Age of Onset  . Heart disease Mother   . Stroke Mother   . Cancer Father   . Cancer  Sister   . COPD Brother   . Heart disease Sister   . COPD Sister     Social History   Socioeconomic History  . Marital status: Married    Spouse name: Not on file  . Number of children: Not on file  . Years of education: Not on file  . Highest education level: Not on file  Occupational History  . Not on file  Social Needs  . Financial resource strain: Not on file  . Food insecurity:    Worry: Not on file    Inability: Not on file  . Transportation needs:    Medical: Not on file    Non-medical: Not on file  Tobacco Use  . Smoking status: Former Smoker    Types: Cigarettes    Last attempt to quit: 06/10/1996    Years since quitting: 21.6  . Smokeless tobacco: Never Used  Substance and Sexual Activity  . Alcohol use: No    Alcohol/week: 0.0 oz  . Drug use: No  . Sexual activity: Not on file  Lifestyle  . Physical activity:    Days per week: Not on file    Minutes per session: Not on file  . Stress: Not on file  Relationships  . Social connections:    Talks on phone: Not on file    Gets together: Not on file    Attends religious service: Not on file    Active member of club or organization: Not on file    Attends meetings of clubs or organizations: Not on file    Relationship status: Not on file  . Intimate partner violence:    Fear of current or ex partner: Not on file    Emotionally abused: Not on file    Physically abused: Not on file    Forced sexual activity: Not on file  Other Topics Concern  . Not on file  Social History Narrative  . Not on file    Environmental and Social history  Lives in a house with a dry environment, a cat and dog located inside the household, plastic on the bed, plastic on the pillow, and no smoking ongoing with inside the household.  He works in a Writer as a Horticulturist, commercial.  Objective:   Vitals:   01/19/18 1345  BP: 122/84  Pulse: 72  Resp: 18  Temp: 98.3 F (36.8 C)   Height: 5\' 10"  (177.8 cm) Weight:  224 lb (101.6 kg)  Physical Exam  HENT:  Head: Normocephalic.  Right Ear: Tympanic membrane, external ear and ear canal normal.  Left Ear: Tympanic membrane, external ear and ear canal normal.  Nose: Nose normal. No mucosal edema or rhinorrhea.  Mouth/Throat: Uvula is midline, oropharynx is clear and moist and mucous membranes are normal. No oropharyngeal exudate.  Eyes: Conjunctivae are  normal.  Neck: Trachea normal. No tracheal tenderness present. No tracheal deviation present. No thyromegaly present.  Cardiovascular: Normal rate, regular rhythm, S1 normal, S2 normal and normal heart sounds.  No murmur heard. Pulmonary/Chest: Breath sounds normal. No stridor. No respiratory distress. He has no wheezes. He has no rales.  Musculoskeletal: He exhibits no edema.  Lymphadenopathy:       Head (right side): No tonsillar adenopathy present.       Head (left side): No tonsillar adenopathy present.    He has no cervical adenopathy.  Neurological: He is alert.  Skin: No rash noted. He is not diaphoretic. No erythema. Nails show no clubbing.    Diagnostics: Allergy skin tests were not performed.   Spirometry was performed and demonstrated an FEV1 of 3.07 @ 89 % of predicted. FEV1/FVC = 0.86  Assessment and Plan:    1. Asthma, moderate persistent, well-controlled   2. Other allergic rhinitis   3. LPRD (laryngopharyngeal reflux disease)     1. Use the following plan for the next two weeks:   A. Symbicort 160 - 2 inhalations two times per day with spacer  B. Flonase - 1 spray each nostril two times per day  C. Montelukast 10mg  - 1 tablet one time per day  D. Increase omeprazole to 40mg  two times per day  2. If needed:   A. Proair HFA 2 inhalations every 4-6 hours  B. OTC antihistamine  C. Azelastine - 2- sprays each nostril 2 times per day  D. OTC Mucinex DM - 2 tablets two times per day  3. Use the plan above if symptoms return in the future  4. Further evaluation? Lisinopril?     Chantz appears to be doing better regarding his recent event tied up with both upper and lower airway symptoms.  At this point I will assume that he developed a respiratory tract infection this spring and with the therapy he has received in the past has shown improvement.  I given him a plan to utilize for the next 2 weeks which covers both inflammation of his airway and reflux induced respiratory tract symptoms especially given the fact that he did have posttussive emesis.  Assuming he does well he can discontinue these agents after 2 weeks and he can restart this plan should he develop a problem in the future.  Of course, if he has recurrent exacerbations in the face of this plan then we may need to regroup and have him use a preventative anti-inflammatory plan for his respiratory tract utilized on a consistent basis.  On one additional point, he is on lisinopril and this may be magnifying the cough that he develops when he acquires a respiratory tract infection.  For now he will remain on this medication.  Allena Katz, MD Allergy / Immunology [place]

## 2018-01-20 ENCOUNTER — Encounter: Payer: Self-pay | Admitting: Allergy and Immunology

## 2018-02-01 ENCOUNTER — Ambulatory Visit: Payer: Self-pay | Admitting: Allergy and Immunology

## 2018-02-01 DIAGNOSIS — H2512 Age-related nuclear cataract, left eye: Secondary | ICD-10-CM | POA: Diagnosis not present

## 2018-02-01 DIAGNOSIS — H25042 Posterior subcapsular polar age-related cataract, left eye: Secondary | ICD-10-CM | POA: Diagnosis not present

## 2018-02-01 DIAGNOSIS — H25012 Cortical age-related cataract, left eye: Secondary | ICD-10-CM | POA: Diagnosis not present

## 2018-02-01 DIAGNOSIS — H35372 Puckering of macula, left eye: Secondary | ICD-10-CM | POA: Diagnosis not present

## 2018-03-04 DIAGNOSIS — H25042 Posterior subcapsular polar age-related cataract, left eye: Secondary | ICD-10-CM | POA: Diagnosis not present

## 2018-03-04 DIAGNOSIS — H2512 Age-related nuclear cataract, left eye: Secondary | ICD-10-CM | POA: Diagnosis not present

## 2018-03-04 DIAGNOSIS — H25012 Cortical age-related cataract, left eye: Secondary | ICD-10-CM | POA: Diagnosis not present

## 2018-03-17 DIAGNOSIS — M1812 Unilateral primary osteoarthritis of first carpometacarpal joint, left hand: Secondary | ICD-10-CM | POA: Diagnosis not present

## 2018-03-17 DIAGNOSIS — M79644 Pain in right finger(s): Secondary | ICD-10-CM | POA: Diagnosis not present

## 2018-03-17 DIAGNOSIS — M1811 Unilateral primary osteoarthritis of first carpometacarpal joint, right hand: Secondary | ICD-10-CM | POA: Diagnosis not present

## 2018-03-17 DIAGNOSIS — M79645 Pain in left finger(s): Secondary | ICD-10-CM | POA: Diagnosis not present

## 2018-04-08 DIAGNOSIS — Z961 Presence of intraocular lens: Secondary | ICD-10-CM | POA: Diagnosis not present

## 2018-06-17 DIAGNOSIS — M71571 Other bursitis, not elsewhere classified, right ankle and foot: Secondary | ICD-10-CM | POA: Diagnosis not present

## 2018-06-17 DIAGNOSIS — M21622 Bunionette of left foot: Secondary | ICD-10-CM | POA: Diagnosis not present

## 2018-06-17 DIAGNOSIS — M19072 Primary osteoarthritis, left ankle and foot: Secondary | ICD-10-CM | POA: Diagnosis not present

## 2018-07-01 DIAGNOSIS — M71571 Other bursitis, not elsewhere classified, right ankle and foot: Secondary | ICD-10-CM | POA: Diagnosis not present

## 2018-07-13 ENCOUNTER — Encounter: Payer: Self-pay | Admitting: Podiatry

## 2018-07-13 ENCOUNTER — Ambulatory Visit (INDEPENDENT_AMBULATORY_CARE_PROVIDER_SITE_OTHER): Payer: Medicare HMO

## 2018-07-13 ENCOUNTER — Ambulatory Visit: Payer: Medicare HMO | Admitting: Podiatry

## 2018-07-13 VITALS — BP 161/93 | HR 78

## 2018-07-13 DIAGNOSIS — M21621 Bunionette of right foot: Secondary | ICD-10-CM | POA: Diagnosis not present

## 2018-07-13 DIAGNOSIS — L989 Disorder of the skin and subcutaneous tissue, unspecified: Secondary | ICD-10-CM | POA: Diagnosis not present

## 2018-07-13 DIAGNOSIS — M21619 Bunion of unspecified foot: Secondary | ICD-10-CM

## 2018-07-13 DIAGNOSIS — M21611 Bunion of right foot: Secondary | ICD-10-CM | POA: Diagnosis not present

## 2018-07-13 NOTE — Patient Instructions (Signed)
Pre-Operative Instructions  Congratulations, you have decided to take an important step towards improving your quality of life.  You can be assured that the doctors and staff at [facility] will be with you every step of the way.  Here are some important things you should know:  1. Plan to be at the surgery center/hospital at least 1 (one) hour prior to your scheduled time, unless otherwise directed by the surgical center/hospital staff.  You must have a responsible adult accompany you, remain during the surgery and drive you home.  Make sure you have directions to the surgical center/hospital to ensure you arrive on time. 2. If you are having surgery at [facility] or [facility] hospitals, you will need a copy of your medical history and physical form from your family physician within one month prior to the date of surgery. We will give you a form for your primary physician to complete.  3. We make every effort to accommodate the date you request for surgery.  However, there are times where surgery dates or times have to be moved.  We will contact you as soon as possible if a change in schedule is required.   4. No aspirin/ibuprofen for one week before surgery.  If you are on aspirin, any non-steroidal anti-inflammatory medications (Mobic, Aleve, Ibuprofen) should not be taken seven (7) days prior to your surgery.  You make take Tylenol for pain prior to surgery.  5. Medications - If you are taking daily heart and blood pressure medications, seizure, reflux, allergy, asthma, anxiety, pain or diabetes medications, make sure you notify the surgery center/hospital before the day of surgery so they can tell you which medications you should take or avoid the day of surgery. 6. No food or drink after midnight the night before surgery unless directed otherwise by surgical center/hospital staff. 7. No alcoholic beverages 24-hours prior to surgery.  No smoking 24-hours prior or 24-hours after  surgery. 8. Wear loose pants or shorts. They should be loose enough to fit over bandages, boots, and casts. 9. Don't wear slip-on shoes. Sneakers are preferred. 10. Bring your boot with you to the surgery center/hospital.  Also bring crutches or a walker if your physician has prescribed it for you.  If you do not have this equipment, it will be provided for you after surgery. 11. If you have not been contacted by the surgery center/hospital by the day before your surgery, call to confirm the date and time of your surgery. 12. Leave-time from work may vary depending on the type of surgery you have.  Appropriate arrangements should be made prior to surgery with your employer. 13. Prescriptions will be provided immediately following surgery by your doctor.  Fill these as soon as possible after surgery and take the medication as directed. Pain medications will not be refilled on weekends and must be approved by the doctor. 14. Remove nail polish on the operative foot and avoid getting pedicures prior to surgery. 15. Wash the night before surgery.  The night before surgery wash the foot and leg well with water and the antibacterial soap provided. Be sure to pay special attention to beneath the toenails and in between the toes.  Wash for at least three (3) minutes. Rinse thoroughly with water and dry well with a towel.  Perform this wash unless told not to do so by your physician.  Enclosed: 1 Ice pack (please put in freezer the night before surgery)   1 Hibiclens skin cleaner     Pre-op instructions  If you have any questions regarding the instructions, please do not hesitate to call our office.  [place]: [address], [place], [place] [address] -- [phone]  [place]: [address] [place], [place] [address] -- [phone]  [place]: [address]  [place], [place] [address] -- [phone]  [place]: [address], [address], [place], [place] [address] -- [phone]  Website:  [email or web address] 

## 2018-07-17 NOTE — Progress Notes (Signed)
   Subjective: 67 year old male presenting today as a new patient, referred by Dr. Gershon Mussel, with a chief complaint of pain to the lateral right foot that began 6-7 months ago. He states Dr. Gershon Mussel told him he was losing padding from the plantar aspect of the foot which is causing the pain. Walking and bearing weight increases the pain. He has not had any treatment for the symptoms. Patient is here for further evaluation and treatment.   Past Medical History:  Diagnosis Date  . GERD (gastroesophageal reflux disease)   . Hyperlipidemia   . Hypertension      Objective: Physical Exam General: The patient is alert and oriented x3 in no acute distress.  Dermatology: Hyperkeratotic lesion present on the right sub-fifth MPJ. Pain on palpation with a central nucleated core noted. Skin is cool, dry and supple bilateral lower extremities. Negative for open lesions or macerations.  Vascular: Palpable pedal pulses bilaterally. No edema or erythema noted. Capillary refill within normal limits.  Neurological: Epicritic and protective threshold grossly intact bilaterally.   Musculoskeletal Exam: Clinical evidence of Tailor's bunion deformity noted to the respective foot. There is a moderate pain on palpation range of motion of the fifth MPJ.   Radiographic Exam: Increased intermetatarsal angle to the fourth interspace of the respective foot. Prominent fifth metatarsal head. Joint spaces preserved.   Assessment: 1. Tailor's bunion deformity right 2. Porokeratosis right sub-fifth MPJ   Plan of Care:  1. Patient was evaluated. 2. Today we discussed the conservative versus surgical management of the presenting pathology. The patient opts for surgical management. All possible complications and details of the procedure were explained. All patient questions were answered. No guarantees were expressed or implied. 3. Authorization for surgery was initiated today. Surgery will consist of Tailor's bunionectomy with  5th metatarsal osteotomy right.  4. Excisional debridement of keratoic lesion using a chisel blade was performed without incident. Salinocaine applied and dressed with light dressing. 5. Post op shoe dispensed.  6. Return to clinic one week post op.    Edrick Kins, DPM [facility]  Dr. Edrick Kins, [address]                                        [place], [place] [address]                Office [phone]  Fax [phone]

## 2018-08-01 DIAGNOSIS — Z125 Encounter for screening for malignant neoplasm of prostate: Secondary | ICD-10-CM | POA: Diagnosis not present

## 2018-08-01 DIAGNOSIS — Z23 Encounter for immunization: Secondary | ICD-10-CM | POA: Diagnosis not present

## 2018-08-01 DIAGNOSIS — Z0000 Encounter for general adult medical examination without abnormal findings: Secondary | ICD-10-CM | POA: Diagnosis not present

## 2018-08-01 DIAGNOSIS — E559 Vitamin D deficiency, unspecified: Secondary | ICD-10-CM | POA: Diagnosis not present

## 2018-08-01 DIAGNOSIS — E785 Hyperlipidemia, unspecified: Secondary | ICD-10-CM | POA: Diagnosis not present

## 2018-08-01 DIAGNOSIS — I10 Essential (primary) hypertension: Secondary | ICD-10-CM | POA: Diagnosis not present

## 2018-08-01 DIAGNOSIS — K219 Gastro-esophageal reflux disease without esophagitis: Secondary | ICD-10-CM | POA: Diagnosis not present

## 2018-08-01 DIAGNOSIS — E1169 Type 2 diabetes mellitus with other specified complication: Secondary | ICD-10-CM | POA: Diagnosis not present

## 2018-08-01 DIAGNOSIS — J449 Chronic obstructive pulmonary disease, unspecified: Secondary | ICD-10-CM | POA: Diagnosis not present

## 2018-08-01 DIAGNOSIS — R69 Illness, unspecified: Secondary | ICD-10-CM | POA: Diagnosis not present

## 2018-08-25 ENCOUNTER — Telehealth: Payer: Self-pay | Admitting: *Deleted

## 2018-08-25 NOTE — Telephone Encounter (Signed)
"  I thought you said no more surgeries for January 23.  This surgery was sent to me today."  I didn't have him down for that date.  "I received the paperwork today."  I'll call the patient and reschedule him.    I am calling you in regards to your surgery scheduled for January 23.  Dr. Amalia Hailey cannot do it that date.  "He can't, what date can he do it?  Can he do it before that date?"  He cannot do it before that date.  He can do it on February 6.  "That date will be fine.  I'm driving right now and I jotted the date on a piece of paper.  I will write it on my calendar permanently when I get home.  What time will I need to be there?"  Someone from the surgical center will call you a day or two prior to your surgery date and they will give you your arrival time.

## 2018-08-29 NOTE — Telephone Encounter (Signed)
Postop appointments have been rescheduled. °

## 2018-09-12 DIAGNOSIS — M1812 Unilateral primary osteoarthritis of first carpometacarpal joint, left hand: Secondary | ICD-10-CM | POA: Diagnosis not present

## 2018-09-12 DIAGNOSIS — M1811 Unilateral primary osteoarthritis of first carpometacarpal joint, right hand: Secondary | ICD-10-CM | POA: Diagnosis not present

## 2018-09-14 ENCOUNTER — Other Ambulatory Visit: Payer: Medicare HMO

## 2018-09-16 DIAGNOSIS — M79676 Pain in unspecified toe(s): Secondary | ICD-10-CM

## 2018-09-22 ENCOUNTER — Other Ambulatory Visit: Payer: Self-pay | Admitting: Podiatry

## 2018-09-22 ENCOUNTER — Encounter: Payer: Self-pay | Admitting: Podiatry

## 2018-09-22 DIAGNOSIS — E7800 Pure hypercholesterolemia, unspecified: Secondary | ICD-10-CM | POA: Diagnosis not present

## 2018-09-22 DIAGNOSIS — M21541 Acquired clubfoot, right foot: Secondary | ICD-10-CM | POA: Diagnosis not present

## 2018-09-22 DIAGNOSIS — M21611 Bunion of right foot: Secondary | ICD-10-CM | POA: Diagnosis not present

## 2018-09-22 DIAGNOSIS — M21621 Bunionette of right foot: Secondary | ICD-10-CM | POA: Diagnosis not present

## 2018-09-22 MED ORDER — OXYCODONE-ACETAMINOPHEN 5-325 MG PO TABS: 1.0000 | ORAL_TABLET | Freq: Four times a day (QID) | ORAL | 0 refills | Status: DC | PRN

## 2018-09-22 NOTE — Progress Notes (Signed)
Post op

## 2018-09-28 ENCOUNTER — Ambulatory Visit (INDEPENDENT_AMBULATORY_CARE_PROVIDER_SITE_OTHER): Payer: Medicare HMO

## 2018-09-28 ENCOUNTER — Ambulatory Visit (INDEPENDENT_AMBULATORY_CARE_PROVIDER_SITE_OTHER): Payer: Self-pay | Admitting: Podiatry

## 2018-09-28 VITALS — Temp 96.0°F

## 2018-09-28 DIAGNOSIS — Z09 Encounter for follow-up examination after completed treatment for conditions other than malignant neoplasm: Secondary | ICD-10-CM

## 2018-09-28 DIAGNOSIS — M21621 Bunionette of right foot: Secondary | ICD-10-CM | POA: Diagnosis not present

## 2018-09-28 MED ORDER — OXYCODONE-ACETAMINOPHEN 5-325 MG PO TABS: 1.0000 | ORAL_TABLET | Freq: Four times a day (QID) | ORAL | 0 refills | Status: DC | PRN

## 2018-09-28 MED ORDER — DOXYCYCLINE HYCLATE 100 MG PO TABS: 100.0000 mg | ORAL_TABLET | Freq: Two times a day (BID) | ORAL | 0 refills | Status: DC

## 2018-10-03 NOTE — Progress Notes (Signed)
   Subjective:  Patient presents today status post Tailor's bunionectomy right. DOS: 09/22/2018. He states he is doing well overall. He states his pain is tolerable but is increased with weightbearing. He reports some associated redness around the incision site. He has been taking Percocet as directed for pain. Patient is here for further evaluation and treatment.    Past Medical History:  Diagnosis Date  . GERD (gastroesophageal reflux disease)   . Hyperlipidemia   . Hypertension       Objective/Physical Exam Neurovascular status intact.  Skin incisions appear to be well coapted with sutures and staples intact. No sign of infectious process noted. No dehiscence. No active bleeding noted. Moderate edema noted to the surgical extremity.  Radiographic Exam:  Orthopedic hardware and osteotomies sites appear to be stable with routine healing.  Assessment: 1. s/p Tailor's bunionectomy right. DOS: 09/22/2018   Plan of Care:  1. Patient was evaluated. X-rays reviewed 2. Dressing changed. Keep clean, dry and intact for one week.  3. Prescription for Doxycycline 100 mg #20 provided to patient.  4. Continue weightbearing in post op shoe.  5. Refill prescription for Percocet 5/325 mg provided to patient.  6. Return to clinic in one week.    Edrick Kins, DPM [facility]  Dr. Edrick Kins, [address]                                        [place], [place] [address]                Office [phone]  Fax [phone]

## 2018-10-10 ENCOUNTER — Ambulatory Visit (INDEPENDENT_AMBULATORY_CARE_PROVIDER_SITE_OTHER): Payer: Self-pay | Admitting: Podiatry

## 2018-10-10 DIAGNOSIS — M21621 Bunionette of right foot: Secondary | ICD-10-CM

## 2018-10-10 DIAGNOSIS — Z09 Encounter for follow-up examination after completed treatment for conditions other than malignant neoplasm: Secondary | ICD-10-CM

## 2018-10-12 NOTE — Progress Notes (Signed)
   Subjective:  Patient presents today status post Tailor's bunionectomy right. DOS: 09/22/2018. He states he is doing well overall. He reports that his staples are bothering him. There are no modifying factors noted. He has been using the post op shoe as directed. Patient is here for further evaluation and treatment.   Past Medical History:  Diagnosis Date  . GERD (gastroesophageal reflux disease)   . Hyperlipidemia   . Hypertension       Objective/Physical Exam Neurovascular status intact.  Skin incisions appear to be well coapted with sutures and staples intact. No sign of infectious process noted. No dehiscence. No active bleeding noted. Moderate edema noted to the surgical extremity.  Radiographic Exam:  Orthopedic hardware and osteotomies sites appear to be stable with routine healing.  Assessment: 1. s/p Tailor's bunionectomy right. DOS: 09/22/2018   Plan of Care:  1. Patient was evaluated. X-rays reviewed 2. Staples removed.   3. Continue weightbearing in post op shoe.  4. Return to clinic in 2 weeks.    Edrick Kins, DPM [facility]  Dr. Edrick Kins, [address]                                        [place], [place] [address]                Office [phone]  Fax [phone]

## 2018-10-24 ENCOUNTER — Encounter: Payer: Self-pay | Admitting: Podiatry

## 2018-10-24 ENCOUNTER — Ambulatory Visit (INDEPENDENT_AMBULATORY_CARE_PROVIDER_SITE_OTHER): Payer: Medicare HMO

## 2018-10-24 ENCOUNTER — Ambulatory Visit (INDEPENDENT_AMBULATORY_CARE_PROVIDER_SITE_OTHER): Payer: Self-pay | Admitting: Podiatry

## 2018-10-24 DIAGNOSIS — M21621 Bunionette of right foot: Secondary | ICD-10-CM

## 2018-10-24 DIAGNOSIS — Z9889 Other specified postprocedural states: Secondary | ICD-10-CM

## 2018-10-27 NOTE — Progress Notes (Signed)
   Subjective:  Patient presents today status post Tailor's bunionectomy right. DOS: 09/22/2018. He states he is doing well. He reports some mild soreness of the foot but states it is not bad. There are no modifying factors noted. He has been using the post op shoe as directed. Patient is here for further evaluation and treatment.   Past Medical History:  Diagnosis Date  . GERD (gastroesophageal reflux disease)   . Hyperlipidemia   . Hypertension       Objective/Physical Exam Neurovascular status intact.  Skin incisions appear to be well coapted. No sign of infectious process noted. No dehiscence. No active bleeding noted. Moderate edema noted to the surgical extremity.  Radiographic Exam:  Orthopedic hardware and osteotomies sites appear to be stable with routine healing.  Assessment: 1. s/p Tailor's bunionectomy right. DOS: 09/22/2018   Plan of Care:  1. Patient was evaluated. X-rays reviewed 2. Continue using post op shoe for 2 weeks, then recommended good shoe gear after that.  3. May resume full activity with no restrictions after two weeks.  4. Return to clinic as needed.     Edrick Kins, DPM [facility]  Dr. Edrick Kins, [address]                                        [place], [place] [address]                Office [phone]  Fax [phone]

## 2018-12-14 DIAGNOSIS — M79645 Pain in left finger(s): Secondary | ICD-10-CM | POA: Diagnosis not present

## 2018-12-14 DIAGNOSIS — M180 Bilateral primary osteoarthritis of first carpometacarpal joints: Secondary | ICD-10-CM | POA: Diagnosis not present

## 2018-12-14 DIAGNOSIS — M79644 Pain in right finger(s): Secondary | ICD-10-CM | POA: Diagnosis not present

## 2019-02-03 DIAGNOSIS — Z23 Encounter for immunization: Secondary | ICD-10-CM | POA: Diagnosis not present

## 2019-02-03 DIAGNOSIS — E785 Hyperlipidemia, unspecified: Secondary | ICD-10-CM | POA: Diagnosis not present

## 2019-02-03 DIAGNOSIS — M19041 Primary osteoarthritis, right hand: Secondary | ICD-10-CM | POA: Diagnosis not present

## 2019-02-03 DIAGNOSIS — J449 Chronic obstructive pulmonary disease, unspecified: Secondary | ICD-10-CM | POA: Diagnosis not present

## 2019-02-03 DIAGNOSIS — I10 Essential (primary) hypertension: Secondary | ICD-10-CM | POA: Diagnosis not present

## 2019-02-03 DIAGNOSIS — E1169 Type 2 diabetes mellitus with other specified complication: Secondary | ICD-10-CM | POA: Diagnosis not present

## 2019-02-03 DIAGNOSIS — M19042 Primary osteoarthritis, left hand: Secondary | ICD-10-CM | POA: Diagnosis not present

## 2019-02-03 DIAGNOSIS — R69 Illness, unspecified: Secondary | ICD-10-CM | POA: Diagnosis not present

## 2019-03-17 ENCOUNTER — Ambulatory Visit: Payer: Medicare HMO | Admitting: Podiatry

## 2019-03-17 ENCOUNTER — Encounter: Payer: Self-pay | Admitting: Podiatry

## 2019-03-17 ENCOUNTER — Other Ambulatory Visit: Payer: Self-pay

## 2019-03-17 VITALS — Temp 97.8°F

## 2019-03-17 DIAGNOSIS — L989 Disorder of the skin and subcutaneous tissue, unspecified: Secondary | ICD-10-CM

## 2019-03-17 DIAGNOSIS — M79675 Pain in left toe(s): Secondary | ICD-10-CM | POA: Insufficient documentation

## 2019-03-17 DIAGNOSIS — M79674 Pain in right toe(s): Secondary | ICD-10-CM | POA: Diagnosis not present

## 2019-03-17 DIAGNOSIS — B351 Tinea unguium: Secondary | ICD-10-CM

## 2019-03-17 NOTE — Progress Notes (Signed)
Complaint:  Visit Type: Patient returns to my office for  preventative foot care services. Complaint: Patient states" my nails have grown long and thick and become painful to walk and wear shoes" Patient has been diagnosed with DM with no foot complications. The patient presents for preventative foot care services. No changes to ROS.  Patient had surgery on fifth metatarsal right foot.  Patient has painful callus left forefoot.  Podiatric Exam: Vascular: dorsalis pedis and posterior tibial pulses are palpable bilateral. Capillary return is immediate. Temperature gradient is WNL. Skin turgor WNL  Sensorium: Normal Semmes Weinstein monofilament test. Normal tactile sensation bilaterally. Nail Exam: Pt has thick disfigured discolored nails with subungual debris noted bilateral entire nail hallux through fifth toenails Ulcer Exam: There is no evidence of ulcer or pre-ulcerative changes or infection. Orthopedic Exam: Muscle tone and strength are WNL. No limitations in general ROM. No crepitus or effusions noted. Foot type and digits show no abnormalities. Bony prominences are unremarkable. Skin:  Porokeratosis  Left forefoot.. No infection or ulcers  Diagnosis:  Onychomycosis, , Pain in right toe, pain in left toes  Porokeratosis left forefoot.  Treatment & Plan Procedures and Treatment: Consent by patient was obtained for treatment procedures.   Debridement of mycotic and hypertrophic toenails, 1 through 5 bilateral and clearing of subungual debris. No ulceration, no infection noted. Debride porokeratosis left forefoot. Return Visit-Office Procedure: Patient instructed to return to the office for a follow up visit 4  months for continued evaluation and treatment.    Gardiner Barefoot DPM

## 2019-04-05 DIAGNOSIS — M180 Bilateral primary osteoarthritis of first carpometacarpal joints: Secondary | ICD-10-CM | POA: Diagnosis not present

## 2019-07-03 DIAGNOSIS — M180 Bilateral primary osteoarthritis of first carpometacarpal joints: Secondary | ICD-10-CM | POA: Diagnosis not present

## 2019-07-03 DIAGNOSIS — M79644 Pain in right finger(s): Secondary | ICD-10-CM | POA: Diagnosis not present

## 2019-07-03 DIAGNOSIS — M79645 Pain in left finger(s): Secondary | ICD-10-CM | POA: Diagnosis not present

## 2019-07-21 ENCOUNTER — Ambulatory Visit: Payer: Medicare HMO | Admitting: Podiatry

## 2019-07-25 DIAGNOSIS — M13131 Monoarthritis, not elsewhere classified, right wrist: Secondary | ICD-10-CM | POA: Diagnosis not present

## 2019-07-25 DIAGNOSIS — M1811 Unilateral primary osteoarthritis of first carpometacarpal joint, right hand: Secondary | ICD-10-CM | POA: Diagnosis not present

## 2019-08-03 DIAGNOSIS — M1811 Unilateral primary osteoarthritis of first carpometacarpal joint, right hand: Secondary | ICD-10-CM | POA: Diagnosis not present

## 2019-08-03 DIAGNOSIS — M79644 Pain in right finger(s): Secondary | ICD-10-CM | POA: Diagnosis not present

## 2019-08-03 DIAGNOSIS — Z4789 Encounter for other orthopedic aftercare: Secondary | ICD-10-CM | POA: Insufficient documentation

## 2019-08-03 DIAGNOSIS — M180 Bilateral primary osteoarthritis of first carpometacarpal joints: Secondary | ICD-10-CM | POA: Diagnosis not present

## 2019-08-21 DIAGNOSIS — M180 Bilateral primary osteoarthritis of first carpometacarpal joints: Secondary | ICD-10-CM | POA: Diagnosis not present

## 2019-08-21 DIAGNOSIS — M1811 Unilateral primary osteoarthritis of first carpometacarpal joint, right hand: Secondary | ICD-10-CM | POA: Diagnosis not present

## 2019-08-21 DIAGNOSIS — Z4789 Encounter for other orthopedic aftercare: Secondary | ICD-10-CM | POA: Diagnosis not present

## 2019-08-25 DIAGNOSIS — I10 Essential (primary) hypertension: Secondary | ICD-10-CM | POA: Diagnosis not present

## 2019-08-25 DIAGNOSIS — K219 Gastro-esophageal reflux disease without esophagitis: Secondary | ICD-10-CM | POA: Diagnosis not present

## 2019-08-25 DIAGNOSIS — Z0000 Encounter for general adult medical examination without abnormal findings: Secondary | ICD-10-CM | POA: Diagnosis not present

## 2019-08-25 DIAGNOSIS — R69 Illness, unspecified: Secondary | ICD-10-CM | POA: Diagnosis not present

## 2019-08-25 DIAGNOSIS — M19042 Primary osteoarthritis, left hand: Secondary | ICD-10-CM | POA: Diagnosis not present

## 2019-08-25 DIAGNOSIS — Z23 Encounter for immunization: Secondary | ICD-10-CM | POA: Diagnosis not present

## 2019-08-25 DIAGNOSIS — Z125 Encounter for screening for malignant neoplasm of prostate: Secondary | ICD-10-CM | POA: Diagnosis not present

## 2019-08-25 DIAGNOSIS — E785 Hyperlipidemia, unspecified: Secondary | ICD-10-CM | POA: Diagnosis not present

## 2019-08-25 DIAGNOSIS — N529 Male erectile dysfunction, unspecified: Secondary | ICD-10-CM | POA: Diagnosis not present

## 2019-08-25 DIAGNOSIS — E1169 Type 2 diabetes mellitus with other specified complication: Secondary | ICD-10-CM | POA: Diagnosis not present

## 2019-09-04 DIAGNOSIS — Z4789 Encounter for other orthopedic aftercare: Secondary | ICD-10-CM | POA: Diagnosis not present

## 2019-09-04 DIAGNOSIS — M25641 Stiffness of right hand, not elsewhere classified: Secondary | ICD-10-CM | POA: Diagnosis not present

## 2019-09-04 DIAGNOSIS — M79644 Pain in right finger(s): Secondary | ICD-10-CM | POA: Diagnosis not present

## 2019-09-04 DIAGNOSIS — M180 Bilateral primary osteoarthritis of first carpometacarpal joints: Secondary | ICD-10-CM | POA: Diagnosis not present

## 2019-09-19 DIAGNOSIS — M25641 Stiffness of right hand, not elsewhere classified: Secondary | ICD-10-CM | POA: Diagnosis not present

## 2019-10-02 DIAGNOSIS — M79644 Pain in right finger(s): Secondary | ICD-10-CM | POA: Diagnosis not present

## 2019-10-02 DIAGNOSIS — Z4789 Encounter for other orthopedic aftercare: Secondary | ICD-10-CM | POA: Diagnosis not present

## 2019-10-02 DIAGNOSIS — M180 Bilateral primary osteoarthritis of first carpometacarpal joints: Secondary | ICD-10-CM | POA: Diagnosis not present

## 2019-10-02 DIAGNOSIS — M25641 Stiffness of right hand, not elsewhere classified: Secondary | ICD-10-CM | POA: Diagnosis not present

## 2019-11-07 DIAGNOSIS — Z961 Presence of intraocular lens: Secondary | ICD-10-CM | POA: Diagnosis not present

## 2019-11-07 DIAGNOSIS — Z7984 Long term (current) use of oral hypoglycemic drugs: Secondary | ICD-10-CM | POA: Diagnosis not present

## 2019-11-07 DIAGNOSIS — H35372 Puckering of macula, left eye: Secondary | ICD-10-CM | POA: Diagnosis not present

## 2019-11-07 DIAGNOSIS — H5203 Hypermetropia, bilateral: Secondary | ICD-10-CM | POA: Diagnosis not present

## 2019-11-07 DIAGNOSIS — E119 Type 2 diabetes mellitus without complications: Secondary | ICD-10-CM | POA: Diagnosis not present

## 2019-11-07 DIAGNOSIS — H52223 Regular astigmatism, bilateral: Secondary | ICD-10-CM | POA: Diagnosis not present

## 2019-11-07 DIAGNOSIS — H524 Presbyopia: Secondary | ICD-10-CM | POA: Diagnosis not present

## 2019-11-23 DIAGNOSIS — Z4789 Encounter for other orthopedic aftercare: Secondary | ICD-10-CM | POA: Diagnosis not present

## 2019-11-23 DIAGNOSIS — M79644 Pain in right finger(s): Secondary | ICD-10-CM | POA: Diagnosis not present

## 2019-11-23 DIAGNOSIS — M180 Bilateral primary osteoarthritis of first carpometacarpal joints: Secondary | ICD-10-CM | POA: Diagnosis not present

## 2019-11-23 DIAGNOSIS — M1811 Unilateral primary osteoarthritis of first carpometacarpal joint, right hand: Secondary | ICD-10-CM | POA: Diagnosis not present

## 2019-11-23 DIAGNOSIS — M79645 Pain in left finger(s): Secondary | ICD-10-CM | POA: Diagnosis not present

## 2020-01-10 DIAGNOSIS — Z1283 Encounter for screening for malignant neoplasm of skin: Secondary | ICD-10-CM | POA: Diagnosis not present

## 2020-01-10 DIAGNOSIS — L570 Actinic keratosis: Secondary | ICD-10-CM | POA: Diagnosis not present

## 2020-01-10 DIAGNOSIS — X32XXXA Exposure to sunlight, initial encounter: Secondary | ICD-10-CM | POA: Diagnosis not present

## 2020-01-10 DIAGNOSIS — D225 Melanocytic nevi of trunk: Secondary | ICD-10-CM | POA: Diagnosis not present

## 2020-01-10 DIAGNOSIS — L814 Other melanin hyperpigmentation: Secondary | ICD-10-CM | POA: Diagnosis not present

## 2020-01-26 DIAGNOSIS — L570 Actinic keratosis: Secondary | ICD-10-CM | POA: Diagnosis not present

## 2020-01-26 DIAGNOSIS — X32XXXD Exposure to sunlight, subsequent encounter: Secondary | ICD-10-CM | POA: Diagnosis not present

## 2020-03-01 DIAGNOSIS — M19042 Primary osteoarthritis, left hand: Secondary | ICD-10-CM | POA: Diagnosis not present

## 2020-03-01 DIAGNOSIS — I10 Essential (primary) hypertension: Secondary | ICD-10-CM | POA: Diagnosis not present

## 2020-03-01 DIAGNOSIS — J449 Chronic obstructive pulmonary disease, unspecified: Secondary | ICD-10-CM | POA: Diagnosis not present

## 2020-03-01 DIAGNOSIS — R69 Illness, unspecified: Secondary | ICD-10-CM | POA: Diagnosis not present

## 2020-03-01 DIAGNOSIS — Z7189 Other specified counseling: Secondary | ICD-10-CM | POA: Diagnosis not present

## 2020-03-01 DIAGNOSIS — E1169 Type 2 diabetes mellitus with other specified complication: Secondary | ICD-10-CM | POA: Diagnosis not present

## 2020-03-01 DIAGNOSIS — E785 Hyperlipidemia, unspecified: Secondary | ICD-10-CM | POA: Diagnosis not present

## 2020-05-07 DIAGNOSIS — Z961 Presence of intraocular lens: Secondary | ICD-10-CM | POA: Diagnosis not present

## 2020-05-07 DIAGNOSIS — Z7984 Long term (current) use of oral hypoglycemic drugs: Secondary | ICD-10-CM | POA: Diagnosis not present

## 2020-05-07 DIAGNOSIS — H35372 Puckering of macula, left eye: Secondary | ICD-10-CM | POA: Diagnosis not present

## 2020-05-07 DIAGNOSIS — H524 Presbyopia: Secondary | ICD-10-CM | POA: Diagnosis not present

## 2020-05-07 DIAGNOSIS — Z0100 Encounter for examination of eyes and vision without abnormal findings: Secondary | ICD-10-CM | POA: Diagnosis not present

## 2020-05-07 DIAGNOSIS — E119 Type 2 diabetes mellitus without complications: Secondary | ICD-10-CM | POA: Diagnosis not present

## 2020-05-07 DIAGNOSIS — H5203 Hypermetropia, bilateral: Secondary | ICD-10-CM | POA: Diagnosis not present

## 2020-05-07 DIAGNOSIS — H52223 Regular astigmatism, bilateral: Secondary | ICD-10-CM | POA: Diagnosis not present

## 2020-06-07 ENCOUNTER — Telehealth: Payer: Self-pay | Admitting: Infectious Diseases

## 2020-06-07 ENCOUNTER — Ambulatory Visit (HOSPITAL_COMMUNITY)
Admission: RE | Admit: 2020-06-07 | Discharge: 2020-06-07 | Disposition: A | Discharge status: Home / Self Care | Payer: Medicare Other | Source: Ambulatory Visit | Attending: Pediatrics | Admitting: Pediatrics

## 2020-06-07 ENCOUNTER — Other Ambulatory Visit: Payer: Self-pay | Admitting: Infectious Diseases

## 2020-06-07 DIAGNOSIS — R54 Age-related physical debility: Secondary | ICD-10-CM | POA: Diagnosis present

## 2020-06-07 DIAGNOSIS — U071 COVID-19: Secondary | ICD-10-CM

## 2020-06-07 DIAGNOSIS — Z23 Encounter for immunization: Secondary | ICD-10-CM | POA: Diagnosis not present

## 2020-06-07 MED ORDER — SODIUM CHLORIDE 0.9 % IV SOLN: Freq: Once | INTRAVENOUS | Status: AC

## 2020-06-07 MED ORDER — METHYLPREDNISOLONE SODIUM SUCC 125 MG IJ SOLR: 125.0000 mg | Freq: Once | INTRAMUSCULAR | Status: DC | PRN

## 2020-06-07 MED ORDER — DIPHENHYDRAMINE HCL 50 MG/ML IJ SOLN: 50.0000 mg | Freq: Once | INTRAMUSCULAR | Status: DC | PRN

## 2020-06-07 MED ORDER — EPINEPHRINE 0.3 MG/0.3ML IJ SOAJ: 0.3000 mg | Freq: Once | INTRAMUSCULAR | Status: DC | PRN

## 2020-06-07 MED ORDER — FAMOTIDINE IN NACL 20-0.9 MG/50ML-% IV SOLN: 20.0000 mg | Freq: Once | INTRAVENOUS | Status: DC | PRN

## 2020-06-07 MED ORDER — SODIUM CHLORIDE 0.9 % IV SOLN: INTRAVENOUS | Status: DC | PRN

## 2020-06-07 MED ORDER — ALBUTEROL SULFATE HFA 108 (90 BASE) MCG/ACT IN AERS: 2.0000 | INHALATION_SPRAY | Freq: Once | RESPIRATORY_TRACT | Status: DC | PRN

## 2020-06-07 NOTE — Progress Notes (Signed)
°  Diagnosis: COVID-19  Physician: Dr. Joya Gaskins  Procedure: Covid Infusion Clinic Med: bamlanivimab\etesevimab infusion - Provided patient with bamlanimivab\etesevimab fact sheet for patients, parents and caregivers prior to infusion.  Complications: No immediate complications noted.  Discharge: Discharged home   Howard Simmons 06/07/2020

## 2020-06-07 NOTE — Telephone Encounter (Signed)
Called to Discuss with patient about Covid symptoms and the use of the monoclonal antibody infusion for those with mild to moderate Covid symptoms and at a high risk of hospitalization.     Pt appears to qualify for this infusion due to co-morbid conditions and/or a member of an at-risk group in accordance with the FDA Emergency Use Authorization.    His wife tested positive today. He is having some mild symptoms within 24 hours now including sneezing, congestion and headaches.   Scheduled for today.   No testing - high risk known exposure in home.

## 2020-06-07 NOTE — Discharge Instructions (Signed)

## 2020-06-07 NOTE — Progress Notes (Signed)
I connected by phone with Howard Simmons on 06/07/2020 at 2:12 PM to discuss the potential use of a new treatment for mild to moderate COVID-19 viral infection in non-hospitalized patients.  This patient is a 69 y.o. male that meets the FDA criteria for Emergency Use Authorization of COVID monoclonal antibody casirivimab/imdevimab or bamlanivimab/eteseviamb.  Has a (+) direct SARS-CoV-2 viral test result  Has mild or moderate COVID-19   Is NOT hospitalized due to COVID-19  Is within 10 days of symptom onset  Has at least one of the high risk factor(s) for progression to severe COVID-19 and/or hospitalization as defined in EUA.  Specific high risk criteria : Older age (>/= 68 yo)   I have spoken and communicated the following to the patient or parent/caregiver regarding COVID monoclonal antibody treatment:  1. FDA has authorized the emergency use for the treatment of mild to moderate COVID-19 in adults and pediatric patients with positive results of direct SARS-CoV-2 viral testing who are 67 years of age and older weighing at least 40 kg, and who are at high risk for progressing to severe COVID-19 and/or hospitalization.  2. The significant known and potential risks and benefits of COVID monoclonal antibody, and the extent to which such potential risks and benefits are unknown.  3. Information on available alternative treatments and the risks and benefits of those alternatives, including clinical trials.  4. Patients treated with COVID monoclonal antibody should continue to self-isolate and use infection control measures (e.g., wear mask, isolate, social distance, avoid sharing personal items, clean and disinfect "high touch" surfaces, and frequent handwashing) according to CDC guidelines.   5. The patient or parent/caregiver has the option to accept or refuse COVID monoclonal antibody treatment.  After reviewing this information with the patient, the patient has agreed to receive one of  the available covid 19 monoclonal antibodies and will be provided an appropriate fact sheet prior to infusion.   Janene Madeira, NP 06/07/2020 2:12 PM

## 2020-07-26 DIAGNOSIS — X32XXXD Exposure to sunlight, subsequent encounter: Secondary | ICD-10-CM | POA: Diagnosis not present

## 2020-07-26 DIAGNOSIS — L570 Actinic keratosis: Secondary | ICD-10-CM | POA: Diagnosis not present

## 2020-07-26 DIAGNOSIS — L918 Other hypertrophic disorders of the skin: Secondary | ICD-10-CM | POA: Diagnosis not present

## 2020-08-30 DIAGNOSIS — I10 Essential (primary) hypertension: Secondary | ICD-10-CM | POA: Diagnosis not present

## 2020-08-30 DIAGNOSIS — E1169 Type 2 diabetes mellitus with other specified complication: Secondary | ICD-10-CM | POA: Diagnosis not present

## 2020-08-30 DIAGNOSIS — E559 Vitamin D deficiency, unspecified: Secondary | ICD-10-CM | POA: Diagnosis not present

## 2020-08-30 DIAGNOSIS — M19042 Primary osteoarthritis, left hand: Secondary | ICD-10-CM | POA: Diagnosis not present

## 2020-08-30 DIAGNOSIS — E291 Testicular hypofunction: Secondary | ICD-10-CM | POA: Diagnosis not present

## 2020-08-30 DIAGNOSIS — E785 Hyperlipidemia, unspecified: Secondary | ICD-10-CM | POA: Diagnosis not present

## 2020-08-30 DIAGNOSIS — Z0000 Encounter for general adult medical examination without abnormal findings: Secondary | ICD-10-CM | POA: Diagnosis not present

## 2020-08-30 DIAGNOSIS — N529 Male erectile dysfunction, unspecified: Secondary | ICD-10-CM | POA: Diagnosis not present

## 2020-08-30 DIAGNOSIS — R69 Illness, unspecified: Secondary | ICD-10-CM | POA: Diagnosis not present

## 2020-08-30 DIAGNOSIS — N401 Enlarged prostate with lower urinary tract symptoms: Secondary | ICD-10-CM | POA: Diagnosis not present

## 2020-08-30 DIAGNOSIS — J449 Chronic obstructive pulmonary disease, unspecified: Secondary | ICD-10-CM | POA: Diagnosis not present

## 2021-02-28 DIAGNOSIS — E559 Vitamin D deficiency, unspecified: Secondary | ICD-10-CM | POA: Diagnosis not present

## 2021-02-28 DIAGNOSIS — E1169 Type 2 diabetes mellitus with other specified complication: Secondary | ICD-10-CM | POA: Diagnosis not present

## 2021-02-28 DIAGNOSIS — R69 Illness, unspecified: Secondary | ICD-10-CM | POA: Diagnosis not present

## 2021-02-28 DIAGNOSIS — E785 Hyperlipidemia, unspecified: Secondary | ICD-10-CM | POA: Diagnosis not present

## 2021-02-28 DIAGNOSIS — I10 Essential (primary) hypertension: Secondary | ICD-10-CM | POA: Diagnosis not present

## 2021-02-28 DIAGNOSIS — Z125 Encounter for screening for malignant neoplasm of prostate: Secondary | ICD-10-CM | POA: Diagnosis not present

## 2021-02-28 DIAGNOSIS — N529 Male erectile dysfunction, unspecified: Secondary | ICD-10-CM | POA: Diagnosis not present

## 2021-03-19 DIAGNOSIS — M79644 Pain in right finger(s): Secondary | ICD-10-CM | POA: Diagnosis not present

## 2021-03-19 DIAGNOSIS — M180 Bilateral primary osteoarthritis of first carpometacarpal joints: Secondary | ICD-10-CM | POA: Diagnosis not present

## 2021-03-19 DIAGNOSIS — M1812 Unilateral primary osteoarthritis of first carpometacarpal joint, left hand: Secondary | ICD-10-CM | POA: Diagnosis not present

## 2021-03-19 DIAGNOSIS — Z4789 Encounter for other orthopedic aftercare: Secondary | ICD-10-CM | POA: Diagnosis not present

## 2021-03-19 DIAGNOSIS — M79645 Pain in left finger(s): Secondary | ICD-10-CM | POA: Diagnosis not present

## 2021-05-15 DIAGNOSIS — Z961 Presence of intraocular lens: Secondary | ICD-10-CM | POA: Diagnosis not present

## 2021-05-15 DIAGNOSIS — Z7984 Long term (current) use of oral hypoglycemic drugs: Secondary | ICD-10-CM | POA: Diagnosis not present

## 2021-05-15 DIAGNOSIS — H5203 Hypermetropia, bilateral: Secondary | ICD-10-CM | POA: Diagnosis not present

## 2021-05-15 DIAGNOSIS — H52223 Regular astigmatism, bilateral: Secondary | ICD-10-CM | POA: Diagnosis not present

## 2021-05-15 DIAGNOSIS — E119 Type 2 diabetes mellitus without complications: Secondary | ICD-10-CM | POA: Diagnosis not present

## 2021-05-15 DIAGNOSIS — H43813 Vitreous degeneration, bilateral: Secondary | ICD-10-CM | POA: Diagnosis not present

## 2021-05-15 DIAGNOSIS — H524 Presbyopia: Secondary | ICD-10-CM | POA: Diagnosis not present

## 2021-05-15 DIAGNOSIS — H35372 Puckering of macula, left eye: Secondary | ICD-10-CM | POA: Diagnosis not present

## 2021-09-15 DIAGNOSIS — R69 Illness, unspecified: Secondary | ICD-10-CM | POA: Diagnosis not present

## 2021-09-15 DIAGNOSIS — Z0000 Encounter for general adult medical examination without abnormal findings: Secondary | ICD-10-CM | POA: Diagnosis not present

## 2021-09-15 DIAGNOSIS — E1169 Type 2 diabetes mellitus with other specified complication: Secondary | ICD-10-CM | POA: Diagnosis not present

## 2021-09-15 DIAGNOSIS — Z23 Encounter for immunization: Secondary | ICD-10-CM | POA: Diagnosis not present

## 2021-09-15 DIAGNOSIS — Z7984 Long term (current) use of oral hypoglycemic drugs: Secondary | ICD-10-CM | POA: Diagnosis not present

## 2021-09-15 DIAGNOSIS — K219 Gastro-esophageal reflux disease without esophagitis: Secondary | ICD-10-CM | POA: Diagnosis not present

## 2021-09-15 DIAGNOSIS — E559 Vitamin D deficiency, unspecified: Secondary | ICD-10-CM | POA: Diagnosis not present

## 2021-09-15 DIAGNOSIS — N529 Male erectile dysfunction, unspecified: Secondary | ICD-10-CM | POA: Diagnosis not present

## 2021-09-15 DIAGNOSIS — I10 Essential (primary) hypertension: Secondary | ICD-10-CM | POA: Diagnosis not present

## 2021-09-15 DIAGNOSIS — J449 Chronic obstructive pulmonary disease, unspecified: Secondary | ICD-10-CM | POA: Diagnosis not present

## 2021-09-15 DIAGNOSIS — E785 Hyperlipidemia, unspecified: Secondary | ICD-10-CM | POA: Diagnosis not present

## 2021-10-29 ENCOUNTER — Emergency Department (HOSPITAL_COMMUNITY): Payer: Medicare HMO

## 2021-10-29 ENCOUNTER — Observation Stay (HOSPITAL_COMMUNITY): Payer: Medicare HMO

## 2021-10-29 ENCOUNTER — Observation Stay (HOSPITAL_COMMUNITY)
Admission: EM | Admit: 2021-10-29 | Discharge: 2021-10-31 | Disposition: A | Discharge status: Home / Self Care | Payer: Medicare HMO | Attending: Family Medicine | Admitting: Family Medicine

## 2021-10-29 ENCOUNTER — Other Ambulatory Visit: Payer: Self-pay

## 2021-10-29 ENCOUNTER — Encounter (HOSPITAL_COMMUNITY): Payer: Self-pay

## 2021-10-29 DIAGNOSIS — R410 Disorientation, unspecified: Secondary | ICD-10-CM | POA: Diagnosis not present

## 2021-10-29 DIAGNOSIS — Z7984 Long term (current) use of oral hypoglycemic drugs: Secondary | ICD-10-CM | POA: Diagnosis not present

## 2021-10-29 DIAGNOSIS — K219 Gastro-esophageal reflux disease without esophagitis: Secondary | ICD-10-CM

## 2021-10-29 DIAGNOSIS — I6782 Cerebral ischemia: Secondary | ICD-10-CM | POA: Insufficient documentation

## 2021-10-29 DIAGNOSIS — E876 Hypokalemia: Secondary | ICD-10-CM

## 2021-10-29 DIAGNOSIS — Z87891 Personal history of nicotine dependence: Secondary | ICD-10-CM | POA: Diagnosis not present

## 2021-10-29 DIAGNOSIS — Z79899 Other long term (current) drug therapy: Secondary | ICD-10-CM | POA: Insufficient documentation

## 2021-10-29 DIAGNOSIS — R4182 Altered mental status, unspecified: Secondary | ICD-10-CM | POA: Diagnosis not present

## 2021-10-29 DIAGNOSIS — B340 Adenovirus infection, unspecified: Secondary | ICD-10-CM

## 2021-10-29 DIAGNOSIS — R2681 Unsteadiness on feet: Secondary | ICD-10-CM | POA: Diagnosis not present

## 2021-10-29 DIAGNOSIS — E872 Acidosis, unspecified: Secondary | ICD-10-CM

## 2021-10-29 DIAGNOSIS — Z20822 Contact with and (suspected) exposure to covid-19: Secondary | ICD-10-CM | POA: Insufficient documentation

## 2021-10-29 DIAGNOSIS — R531 Weakness: Secondary | ICD-10-CM

## 2021-10-29 DIAGNOSIS — R269 Unspecified abnormalities of gait and mobility: Secondary | ICD-10-CM

## 2021-10-29 DIAGNOSIS — I700 Atherosclerosis of aorta: Secondary | ICD-10-CM | POA: Diagnosis not present

## 2021-10-29 DIAGNOSIS — R4020 Unspecified coma: Secondary | ICD-10-CM | POA: Diagnosis not present

## 2021-10-29 DIAGNOSIS — T7840XA Allergy, unspecified, initial encounter: Secondary | ICD-10-CM | POA: Diagnosis not present

## 2021-10-29 DIAGNOSIS — R000 Tachycardia, unspecified: Secondary | ICD-10-CM | POA: Diagnosis not present

## 2021-10-29 DIAGNOSIS — E119 Type 2 diabetes mellitus without complications: Secondary | ICD-10-CM | POA: Insufficient documentation

## 2021-10-29 DIAGNOSIS — M2578 Osteophyte, vertebrae: Secondary | ICD-10-CM | POA: Diagnosis not present

## 2021-10-29 DIAGNOSIS — R3981 Functional urinary incontinence: Secondary | ICD-10-CM | POA: Diagnosis not present

## 2021-10-29 DIAGNOSIS — J189 Pneumonia, unspecified organism: Principal | ICD-10-CM

## 2021-10-29 DIAGNOSIS — R296 Repeated falls: Secondary | ICD-10-CM

## 2021-10-29 DIAGNOSIS — I10 Essential (primary) hypertension: Secondary | ICD-10-CM

## 2021-10-29 DIAGNOSIS — R059 Cough, unspecified: Secondary | ICD-10-CM | POA: Diagnosis not present

## 2021-10-29 DIAGNOSIS — R21 Rash and other nonspecific skin eruption: Secondary | ICD-10-CM

## 2021-10-29 DIAGNOSIS — W19XXXA Unspecified fall, initial encounter: Secondary | ICD-10-CM

## 2021-10-29 DIAGNOSIS — R260 Ataxic gait: Secondary | ICD-10-CM | POA: Diagnosis not present

## 2021-10-29 DIAGNOSIS — R42 Dizziness and giddiness: Secondary | ICD-10-CM | POA: Diagnosis not present

## 2021-10-29 DIAGNOSIS — E871 Hypo-osmolality and hyponatremia: Secondary | ICD-10-CM

## 2021-10-29 DIAGNOSIS — J181 Lobar pneumonia, unspecified organism: Secondary | ICD-10-CM | POA: Diagnosis not present

## 2021-10-29 DIAGNOSIS — G4489 Other headache syndrome: Secondary | ICD-10-CM | POA: Diagnosis not present

## 2021-10-29 DIAGNOSIS — Z7982 Long term (current) use of aspirin: Secondary | ICD-10-CM | POA: Insufficient documentation

## 2021-10-29 DIAGNOSIS — I2510 Atherosclerotic heart disease of native coronary artery without angina pectoris: Secondary | ICD-10-CM | POA: Diagnosis not present

## 2021-10-29 DIAGNOSIS — Z743 Need for continuous supervision: Secondary | ICD-10-CM | POA: Diagnosis not present

## 2021-10-29 DIAGNOSIS — J449 Chronic obstructive pulmonary disease, unspecified: Secondary | ICD-10-CM | POA: Insufficient documentation

## 2021-10-29 HISTORY — DX: Pneumonia, unspecified organism: J18.9

## 2021-10-29 LAB — BASIC METABOLIC PANEL
Anion gap: 14 (ref 5–15)
BUN: 18 mg/dL (ref 8–23)
CO2: 18 mmol/L — ABNORMAL LOW (ref 22–32)
Calcium: 8.6 mg/dL — ABNORMAL LOW (ref 8.9–10.3)
Chloride: 98 mmol/L (ref 98–111)
Creatinine, Ser: 0.96 mg/dL (ref 0.61–1.24)
GFR, Estimated: >60 mL/min (ref >60)
Glucose, Bld: 151 mg/dL — ABNORMAL HIGH (ref 70–99)
Potassium: 3.4 mmol/L — ABNORMAL LOW (ref 3.5–5.1)
Sodium: 130 mmol/L — ABNORMAL LOW (ref 135–145)

## 2021-10-29 LAB — GLUCOSE, CAPILLARY
Glucose-Capillary: 139 mg/dL — ABNORMAL HIGH (ref 70–99)
Glucose-Capillary: 150 mg/dL — ABNORMAL HIGH (ref 70–99)

## 2021-10-29 LAB — URINALYSIS, ROUTINE W REFLEX MICROSCOPIC
Bacteria, UA: NONE SEEN
Bilirubin Urine: NEGATIVE
Glucose, UA: >=500 mg/dL — AB
Hgb urine dipstick: NEGATIVE
Ketones, ur: 20 mg/dL — AB
Leukocytes,Ua: NEGATIVE
Nitrite: NEGATIVE
Protein, ur: NEGATIVE mg/dL
Specific Gravity, Urine: 1.035 — ABNORMAL HIGH (ref 1.005–1.030)
pH: 5 (ref 5.0–8.0)

## 2021-10-29 LAB — RAPID URINE DRUG SCREEN, HOSP PERFORMED
Amphetamines: NOT DETECTED
Barbiturates: NOT DETECTED
Benzodiazepines: NOT DETECTED
Cocaine: NOT DETECTED
Opiates: NOT DETECTED
Tetrahydrocannabinol: NOT DETECTED

## 2021-10-29 LAB — CBC
HCT: 46.7 % (ref 39.0–52.0)
Hemoglobin: 16 g/dL (ref 13.0–17.0)
MCH: 30.3 pg (ref 26.0–34.0)
MCHC: 34.3 g/dL (ref 30.0–36.0)
MCV: 88.4 fL (ref 80.0–100.0)
Platelets: 187 10*3/uL (ref 150–400)
RBC: 5.28 MIL/uL (ref 4.22–5.81)
RDW: 13.5 % (ref 11.5–15.5)
WBC: 6.2 10*3/uL (ref 4.0–10.5)
nRBC: 0 % (ref 0.0–0.2)

## 2021-10-29 LAB — RESPIRATORY PANEL BY PCR

## 2021-10-29 LAB — RESP PANEL BY RT-PCR (FLU A&B, COVID) ARPGX2
Influenza A by PCR: NEGATIVE
Influenza B by PCR: NEGATIVE
SARS Coronavirus 2 by RT PCR: NEGATIVE

## 2021-10-29 LAB — HIV ANTIBODY (ROUTINE TESTING W REFLEX): HIV Screen 4th Generation wRfx: NONREACTIVE

## 2021-10-29 LAB — MAGNESIUM: Magnesium: 1.9 mg/dL (ref 1.7–2.4)

## 2021-10-29 LAB — ETHANOL: Alcohol, Ethyl (B): <10 mg/dL (ref <10)

## 2021-10-29 LAB — CBG MONITORING, ED
Glucose-Capillary: 155 mg/dL — ABNORMAL HIGH (ref 70–99)
Glucose-Capillary: 144 mg/dL — ABNORMAL HIGH (ref 70–99)

## 2021-10-29 LAB — STREP PNEUMONIAE URINARY ANTIGEN: Strep Pneumo Urinary Antigen: NEGATIVE

## 2021-10-29 LAB — AMMONIA: Ammonia: <10 umol/L (ref 9–35)

## 2021-10-29 MED ORDER — ATORVASTATIN CALCIUM 20 MG PO TABS
20.0000 mg | ORAL_TABLET | Freq: Every day | ORAL | Status: DC
  Administered 2021-10-29 – 2021-10-31 (×3): 20 mg via ORAL
  Filled 2021-10-29 (×3): qty 1

## 2021-10-29 MED ORDER — SODIUM CHLORIDE 0.9 % IV SOLN: INTRAVENOUS | Status: DC

## 2021-10-29 MED ORDER — LORATADINE 10 MG PO TABS
10.0000 mg | ORAL_TABLET | Freq: Every day | ORAL | Status: DC
  Administered 2021-10-29 – 2021-10-31 (×3): 10 mg via ORAL
  Filled 2021-10-29 (×3): qty 1

## 2021-10-29 MED ORDER — PHENOL 1.4 % MT LIQD: 1.0000 | OROMUCOSAL | Status: DC | PRN

## 2021-10-29 MED ORDER — PREDNISONE 20 MG PO TABS
40.0000 mg | ORAL_TABLET | Freq: Every day | ORAL | Status: DC
  Administered 2021-10-30: 40 mg via ORAL
  Filled 2021-10-29: qty 2

## 2021-10-29 MED ORDER — ACETAMINOPHEN 325 MG PO TABS: 650.0000 mg | ORAL_TABLET | Freq: Four times a day (QID) | ORAL | Status: DC | PRN

## 2021-10-29 MED ORDER — INSULIN ASPART 100 UNIT/ML IJ SOLN
0.0000 [IU] | Freq: Three times a day (TID) | INTRAMUSCULAR | Status: DC
  Administered 2021-10-29: 2 [IU] via SUBCUTANEOUS
  Administered 2021-10-30: 3 [IU] via SUBCUTANEOUS
  Administered 2021-10-30: 1 [IU] via SUBCUTANEOUS
  Administered 2021-10-30: 5 [IU] via SUBCUTANEOUS
  Filled 2021-10-29: qty 0.15

## 2021-10-29 MED ORDER — ALBUTEROL SULFATE (2.5 MG/3ML) 0.083% IN NEBU: 2.5000 mg | INHALATION_SOLUTION | RESPIRATORY_TRACT | Status: DC | PRN

## 2021-10-29 MED ORDER — SODIUM CHLORIDE 0.9 % IV SOLN
2.0000 g | INTRAVENOUS | Status: DC
  Administered 2021-10-30 – 2021-10-31 (×2): 2 g via INTRAVENOUS
  Filled 2021-10-29 (×2): qty 20

## 2021-10-29 MED ORDER — LOSARTAN POTASSIUM 50 MG PO TABS
50.0000 mg | ORAL_TABLET | Freq: Every day | ORAL | Status: DC
  Administered 2021-10-29 – 2021-10-31 (×3): 50 mg via ORAL
  Filled 2021-10-29 (×3): qty 1

## 2021-10-29 MED ORDER — SODIUM CHLORIDE (PF) 0.9 % IJ SOLN
INTRAMUSCULAR | Status: AC
  Filled 2021-10-29: qty 50

## 2021-10-29 MED ORDER — SODIUM CHLORIDE 0.9 % IV SOLN
2.0000 g | Freq: Once | INTRAVENOUS | Status: AC
  Administered 2021-10-29: 2 g via INTRAVENOUS
  Filled 2021-10-29: qty 20

## 2021-10-29 MED ORDER — DULOXETINE HCL 60 MG PO CPEP
60.0000 mg | ORAL_CAPSULE | Freq: Every day | ORAL | Status: DC
  Administered 2021-10-29 – 2021-10-31 (×3): 60 mg via ORAL
  Filled 2021-10-29 (×3): qty 1

## 2021-10-29 MED ORDER — AZITHROMYCIN 250 MG PO TABS
500.0000 mg | ORAL_TABLET | Freq: Every day | ORAL | Status: DC
  Administered 2021-10-30 – 2021-10-31 (×2): 500 mg via ORAL
  Filled 2021-10-29 (×2): qty 2

## 2021-10-29 MED ORDER — IOHEXOL 300 MG/ML  SOLN
75.0000 mL | Freq: Once | INTRAMUSCULAR | Status: AC | PRN
  Administered 2021-10-29: 75 mL via INTRAVENOUS

## 2021-10-29 MED ORDER — ONDANSETRON HCL 4 MG/2ML IJ SOLN
4.0000 mg | Freq: Four times a day (QID) | INTRAMUSCULAR | Status: DC | PRN
Start: 2021-10-29 — End: 2021-10-31

## 2021-10-29 MED ORDER — HYDRALAZINE HCL 20 MG/ML IJ SOLN: 10.0000 mg | Freq: Three times a day (TID) | INTRAMUSCULAR | Status: DC | PRN

## 2021-10-29 MED ORDER — SODIUM CHLORIDE 0.9 % IV SOLN: Freq: Once | INTRAVENOUS | Status: AC

## 2021-10-29 MED ORDER — GUAIFENESIN ER 600 MG PO TB12
600.0000 mg | ORAL_TABLET | Freq: Two times a day (BID) | ORAL | Status: DC
  Administered 2021-10-29 – 2021-10-31 (×5): 600 mg via ORAL
  Filled 2021-10-29 (×5): qty 1

## 2021-10-29 MED ORDER — SODIUM CHLORIDE 0.9 % IV SOLN
500.0000 mg | Freq: Once | INTRAVENOUS | Status: AC
  Administered 2021-10-29: 500 mg via INTRAVENOUS
  Filled 2021-10-29: qty 5

## 2021-10-29 MED ORDER — PANTOPRAZOLE SODIUM 40 MG PO TBEC
40.0000 mg | DELAYED_RELEASE_TABLET | Freq: Every day | ORAL | Status: DC
Start: 2021-10-29 — End: 2021-10-31
  Administered 2021-10-29 – 2021-10-31 (×3): 40 mg via ORAL
  Filled 2021-10-29 (×3): qty 1

## 2021-10-29 MED ORDER — ACETAMINOPHEN 650 MG RE SUPP: 650.0000 mg | Freq: Four times a day (QID) | RECTAL | Status: DC | PRN

## 2021-10-29 MED ORDER — ONDANSETRON HCL 4 MG PO TABS: 4.0000 mg | ORAL_TABLET | Freq: Four times a day (QID) | ORAL | Status: DC | PRN

## 2021-10-29 MED ORDER — INSULIN ASPART 100 UNIT/ML IJ SOLN
0.0000 [IU] | Freq: Every day | INTRAMUSCULAR | Status: DC
  Filled 2021-10-29: qty 0.05

## 2021-10-29 MED ORDER — ADULT MULTIVITAMIN W/MINERALS CH
1.0000 | ORAL_TABLET | Freq: Every day | ORAL | Status: DC
  Administered 2021-10-29 – 2021-10-31 (×3): 1 via ORAL
  Filled 2021-10-29 (×3): qty 1

## 2021-10-29 MED ORDER — POTASSIUM CHLORIDE CRYS ER 20 MEQ PO TBCR
40.0000 meq | EXTENDED_RELEASE_TABLET | Freq: Two times a day (BID) | ORAL | Status: AC
  Administered 2021-10-29 – 2021-10-30 (×3): 40 meq via ORAL
  Filled 2021-10-29 (×3): qty 2

## 2021-10-29 MED ORDER — ENOXAPARIN SODIUM 40 MG/0.4ML IJ SOSY
40.0000 mg | PREFILLED_SYRINGE | INTRAMUSCULAR | Status: DC
  Administered 2021-10-29 – 2021-10-30 (×2): 40 mg via SUBCUTANEOUS
  Filled 2021-10-29 (×2): qty 0.4

## 2021-10-29 MED ORDER — FLUTICASONE PROPIONATE 50 MCG/ACT NA SUSP
1.0000 | Freq: Every day | NASAL | Status: DC
  Administered 2021-10-29 – 2021-10-31 (×3): 1 via NASAL
  Filled 2021-10-29: qty 16

## 2021-10-29 MED ORDER — VITAMIN D3 25 MCG (1000 UNIT) PO TABS
2000.0000 [IU] | ORAL_TABLET | Freq: Every day | ORAL | Status: DC
  Administered 2021-10-29 – 2021-10-31 (×3): 2000 [IU] via ORAL
  Filled 2021-10-29 (×3): qty 2

## 2021-10-29 NOTE — ED Triage Notes (Signed)
Arrives [facility] from home with c/o dizziness, unsteady gait with several falls, nonproductive cough, subjective fevers and chills x 3 days.  ? ?Wife noticed a concerning rash on chest and back that started tonight.  ?

## 2021-10-29 NOTE — ED Notes (Signed)
Patient returned back from MRI at this time.  °

## 2021-10-29 NOTE — ED Notes (Signed)
Patient taken to CT at this time.

## 2021-10-29 NOTE — H&P (Signed)
?History and Physical  ? ? ?Patient: Howard Simmons YHC:[id number] DOB: 08-25-1950 ?DOA: 10/29/2021 ?DOS: the patient was seen and examined on 10/29/2021 ?PCP: Harlan Stains, MD  ?Patient coming from: Home ? ?Chief Complaint:  ?Chief Complaint  ?Patient presents with  ? Dizziness  ? Cough  ? ?HPI: Howard Simmons is a 71 y.o. male with medical history significant of HLD, DM2, HTN, COPD. Presenting with dizziness and cough. Symptoms started 5 days ago. He noticed that he was unsteady and weak when his moved around. He felt that he needed to hold on to objects to steady himself. He had a couple of falls. There were no heralding symptoms prior to the falls (no CP, palpitations, room spinning). He didn't have any head injuries or LOC. He noticed around the same time, he had problems with scratchy throat, cough, and dyspnea. He was having headaches and chills. He tried OTC meds to help (APAP, ibuprofen, coricidin); but nothing helps. His appetite began to decline. Yesterday he noticed a rash on his back and trunk. It was not pruritic or painful. When his symptoms did not improve this morning. He decided to come to the ED for help. He denies any other aggravating or alleviating factors.  ? ?Review of Systems: As mentioned in the history of present illness. All other systems reviewed and are negative. ?Past Medical History:  ?Diagnosis Date  ? GERD (gastroesophageal reflux disease)   ? Hyperlipidemia   ? Hypertension   ? ?Past Surgical History:  ?Procedure Laterality Date  ? COLON SURGERY    ? COLONOSCOPY WITH PROPOFOL N/A 12/02/2015  ? Procedure: COLONOSCOPY WITH PROPOFOL;  Surgeon: Garlan Fair, MD;  Location: [facility];  Service: Endoscopy;  Laterality: N/A;  ? ?Social History:  reports that he quit smoking about 25 years ago. His smoking use included cigarettes. He has never used smokeless tobacco. He reports that he does not drink alcohol and does not use drugs. ? ?No Known Allergies ? ?Family History  ?Problem  Relation Age of Onset  ? Heart disease Mother   ? Stroke Mother   ? Cancer Father   ? Cancer Sister   ? COPD Brother   ? Heart disease Sister   ? COPD Sister   ? ? ?Prior to Admission medications   ?Medication Sig Start Date End Date Taking? Authorizing Provider  ?albuterol (PROAIR HFA) 108 (90 Base) MCG/ACT inhaler Inhale 1-2 puffs into the lungs every 6 (six) hours as needed for wheezing or shortness of breath.    [provider]  ?aspirin EC 81 MG tablet Take 81 mg by mouth daily.    [provider]  ?atorvastatin (LIPITOR) 10 MG tablet Take 10 mg by mouth daily.    [provider]  ?azelastine (ASTELIN) 0.1 % nasal spray Place 1 spray into both nostrils daily. 12/17/17   [provider]  ?budesonide-formoterol (SYMBICORT) 160-4.5 MCG/ACT inhaler Inhale 2 puffs into the lungs 2 (two) times daily. ?Patient not taking: Reported on 07/13/2018 01/19/18   Jiles Prows, MD  ?cetirizine (ZYRTEC) 10 MG tablet Take 10 mg by mouth daily.    [provider]  ?Cholecalciferol (VITAMIN D-3) 1000 units CAPS Take 2,000 Units by mouth daily.    [provider]  ?DULoxetine (CYMBALTA) 60 MG capsule duloxetine 60 mg capsule,delayed release    [provider]  ?fluticasone (FLONASE) 50 MCG/ACT nasal spray Place 1 spray into both nostrils daily.    [provider]  ?lisinopril (PRINIVIL,ZESTRIL) 5 MG  tablet Take 5 mg by mouth every morning.     [provider]  ?metFORMIN (GLUCOPHAGE) 500 MG tablet Take 500 mg by mouth daily with breakfast.    [provider]  ?montelukast (SINGULAIR) 10 MG tablet Take 1 tablet (10 mg total) by mouth at bedtime. ?Patient not taking: Reported on 07/13/2018 01/19/18   Jiles Prows, MD  ?naproxen (NAPROSYN) 500 MG tablet  11/15/17   [provider]  ?omeprazole (PRILOSEC OTC) 20 MG tablet Take 2 tablets (40 mg total) by mouth 2 (two) times daily. 01/19/18   Kozlow, Donnamarie Poag, MD  ?oxyCODONE-acetaminophen  (PERCOCET) 5-325 MG tablet Take 1 tablet by mouth every 6 (six) hours as needed for severe pain. 09/28/18   Edrick Kins, DPM  ? ? ?Physical Exam: ?Vitals:  ? 10/29/21 0530 10/29/21 0545 10/29/21 0615 10/29/21 0630  ?BP: 115/80 118/81 (!) 141/90 (!) 148/78  ?Pulse: 98 95 95 96  ?Resp: (!) 21 (!) 21 (!) 21 17  ?Temp:      ?TempSrc:      ?SpO2: 94% 95% 92% 94%  ?Weight:      ?Height:      ? ?General: 71 y.o. male resting in bed in NAD ?Eyes: PERRL, normal sclera ?ENMT: Nares patent w/o discharge, orophaynx clear, dentition normal, ears w/o discharge/lesions/ulcers ?Neck: Supple, trachea midline ?Cardiovascular: RRR, +S1, S2, no m/g/r, equal pulses throughout ?Respiratory: scattered exp wheeze, right side rhonchi, no r, slightly increased WOB with activity ?GI: BS+, NDNT, no masses noted, no organomegaly noted ?MSK: No e/c/c ?Skin: No bruises, ulcerations noted;  erythematous/,macular on upper back and trunk ?Neuro: A&O x 3, no focal deficits ? ?Data Reviewed: ? ?Na+ 130 ?K+ 3.4 ?CO2 18 ?Glucose 151 ? ?CT chest: 1. Right lower lobe consolidation most compatible with Pneumonia. Small but reactive appearing right hilar lymph nodes. Trace associated layering right pleural fluid. Followup PA and lateral chest X-ray is recommended in 3-4 weeks following trial of antibiotic therapy to ensure resolution and exclude underlying malignancy. 2. No other acute finding in the chest. Mild calcified atherosclerosis. ? ?Assessment and Plan: ?No notes have been filed under this hospital service. ?Service: Hospitalist ?RLL PNA ?    - place in obs, med-surg ?    - continue CAP coverage ?    - check urine legionella, strep; check RVP ?    - COVID/flu negative ? ?Generalized weakness ?Dizziness ?Falls ?    - likely secondary to decreased PO intake and illness ?    - no focality on exam; MRI has been ordered in ED ?    - PT/OT eval ? ?HTN ?    - continue home regimen when confirmed ? ?DM2 ?    - A1c, SSI, DM diet, glucose checks ? ?COPD ?     - resume home inhalers ?    - there is some scattered wheeze ?    - he will have run of prednisone with his abx for PNA ? ?Non-gap Metabolic acidosis ?    - check lactic acid ?    - continue fluids ? ?Hypokalemia ?Hyponatremia ?    - mildly low Na+; will have fluids, follow ?    - replace K+; check Mg2+ ? ?Macular rash ?    - present on upper back and chest ?    - started in the last day ?    - ?drug reaction ?    - not pruritic or draining ?    - getting  steroids as part of PNA/COPD treatment; follow for now ? ? Advance Care Planning:   Code Status: FULL ? ?Consults: None ? ?Family Communication: w/ wife/daughter ? ?Severity of Illness: ?The appropriate patient status for this patient is OBSERVATION. Observation status is judged to be reasonable and necessary in order to provide the required intensity of service to ensure the patient's safety. The patient's presenting symptoms, physical exam findings, and initial radiographic and laboratory data in the context of their medical condition is felt to place them at decreased risk for further clinical deterioration. Furthermore, it is anticipated that the patient will be medically stable for discharge from the hospital within 2 midnights of admission.  ? ?Author: ?Jonnie Finner, DO ?10/29/2021 8:14 AM ? ?For on call review [email or web address].  ?

## 2021-10-29 NOTE — ED Notes (Signed)
Pt ambulated to bathroom at this time.

## 2021-10-29 NOTE — ED Notes (Signed)
Patient returned from CT at this time.

## 2021-10-29 NOTE — ED Provider Notes (Signed)
[facility] Provider Note   CSN: [id number] Arrival date & time: 10/29/21  0326     History  Chief Complaint  Patient presents with   Dizziness   Cough    Howard Simmons is a 71 y.o. male.  HPI     This is a 71 year old male with a history of hypertension and hyperlipidemia who presents with multiple complaints.  Patient's wife and daughter at the bedside.  He has had multiple recurrent falls over the last several days.  He reports feeling off balance and unsteady on his feet.  Denies room spinning dizziness.  He has had a nonproductive cough and chills as well.  Wife noted earlier this evening a rash on the back and the chest.  No documented fevers at home.  No known sick contacts.  He has not felt short of breath.  Wife reports worsening urinary incontinence.  He has also had a headache.  Wife and daughter state that he appears to have trouble "finding things in space."  They state that if he reaches out to grab something he will overshoot or not be able to put his hand directly on it.  They also reports that he is having to hold onto things from one place to the other.  Patient states "something just feels off."  He does have a history of bowel perforation.  He has not had any nausea or vomiting.  Denies abdominal pain.  Does report that he has not had a bowel movement in several days.  Denies alcohol or drug use.  Home Medications Prior to Admission medications   Medication Sig Start Date End Date Taking? Authorizing Provider  albuterol (PROAIR HFA) 108 (90 Base) MCG/ACT inhaler Inhale 1-2 puffs into the lungs every 6 (six) hours as needed for wheezing or shortness of breath.    [provider]  aspirin EC 81 MG tablet Take 81 mg by mouth daily.    [provider]  atorvastatin (LIPITOR) 10 MG tablet Take 10 mg by mouth daily.    [provider]  azelastine (ASTELIN) 0.1 % nasal spray Place 1 spray into both nostrils  daily. 12/17/17   [provider]  budesonide-formoterol (SYMBICORT) 160-4.5 MCG/ACT inhaler Inhale 2 puffs into the lungs 2 (two) times daily. Patient not taking: Reported on 07/13/2018 01/19/18   Jessica Priest, MD  cetirizine (ZYRTEC) 10 MG tablet Take 10 mg by mouth daily.    [provider]  Cholecalciferol (VITAMIN D-3) 1000 units CAPS Take 2,000 Units by mouth daily.    [provider]  DULoxetine (CYMBALTA) 60 MG capsule duloxetine 60 mg capsule,delayed release    [provider]  fluticasone (FLONASE) 50 MCG/ACT nasal spray Place 1 spray into both nostrils daily.    [provider]  lisinopril (PRINIVIL,ZESTRIL) 5 MG tablet Take 5 mg by mouth every morning.     [provider]  metFORMIN (GLUCOPHAGE) 500 MG tablet Take 500 mg by mouth daily with breakfast.    [provider]  montelukast (SINGULAIR) 10 MG tablet Take 1 tablet (10 mg total) by mouth at bedtime. Patient not taking: Reported on 07/13/2018 01/19/18   Jessica Priest, MD  naproxen (NAPROSYN) 500 MG tablet  11/15/17   [provider]  omeprazole (PRILOSEC OTC) 20 MG tablet Take 2 tablets (40 mg total) by mouth 2 (two) times daily. 01/19/18   Kozlow, Alvira Philips, MD  oxyCODONE-acetaminophen (PERCOCET) 5-325 MG tablet Take 1 tablet by  mouth every 6 (six) hours as needed for severe pain. 09/28/18   Felecia Shelling, DPM      Allergies    Patient has no known allergies.    Review of Systems   Review of Systems  Constitutional:  Positive for chills. Negative for fever.  Respiratory:  Positive for cough. Negative for shortness of breath.   Cardiovascular:  Negative for chest pain.  Gastrointestinal:  Positive for constipation. Negative for abdominal pain, diarrhea, nausea and vomiting.  Skin:  Positive for rash.  Neurological:  Positive for tremors, weakness and headaches.  All other systems reviewed and are negative.  Physical Exam Updated Vital Signs BP (!) 148/78    Pulse 96   Temp 99 F (37.2 C) (Oral)   Resp 17   Ht 1.753 m (5\' 9" )   Wt 102.5 kg   SpO2 94%   BMI 33.37 kg/m  Physical Exam Vitals and nursing note reviewed.  Constitutional:      Appearance: He is well-developed. He is not ill-appearing.  HENT:     Head: Normocephalic and atraumatic.     Nose: Nose normal.     Mouth/Throat:     Mouth: Mucous membranes are moist.  Eyes:     Pupils: Pupils are equal, round, and reactive to light.  Cardiovascular:     Rate and Rhythm: Regular rhythm. Tachycardia present.     Heart sounds: Normal heart sounds. No murmur heard. Pulmonary:     Effort: Pulmonary effort is normal. No respiratory distress.     Breath sounds: Normal breath sounds. No wheezing.  Abdominal:     General: Bowel sounds are normal.     Palpations: Abdomen is soft.     Tenderness: There is no abdominal tenderness. There is no rebound.     Comments: Significant abdominal scarring noted  Musculoskeletal:        General: No swelling.     Cervical back: Neck supple.     Right lower leg: No edema.     Left lower leg: No edema.  Lymphadenopathy:     Cervical: No cervical adenopathy.  Skin:    General: Skin is warm and dry.     Comments: Diffuse erythematous papular rash over the trunk and back, appears to spare palms and soles as well as face  Neurological:     Mental Status: He is alert.     Comments: Oriented to person and place, slightly confused about time, cranial nerves II through XII intact, 5 out of 5 strength in all 4 extremities, no dysmetria to finger-nose-finger, appears to have some difficulty putting his words together.  Psychiatric:        Mood and Affect: Mood normal.    ED Results / Procedures / Treatments   Labs (all labs ordered are listed, but only abnormal results are displayed) Labs Reviewed  BASIC METABOLIC PANEL - Abnormal; Notable for the following components:      Result Value   Sodium 130 (*)    Potassium 3.4 (*)    CO2 18 (*)     Glucose, Bld 151 (*)    Calcium 8.6 (*)    All other components within normal limits  URINALYSIS, ROUTINE W REFLEX MICROSCOPIC - Abnormal; Notable for the following components:   Specific Gravity, Urine 1.035 (*)    Glucose, UA >=500 (*)    Ketones, ur 20 (*)    All other components within normal limits  CBG MONITORING, ED - Abnormal; Notable for the following  components:   Glucose-Capillary 155 (*)    All other components within normal limits  RESP PANEL BY RT-PCR (FLU A&B, COVID) ARPGX2  CBC  AMMONIA  ETHANOL  RAPID URINE DRUG SCREEN, HOSP PERFORMED  ROCKY MTN SPOTTED FVR ABS PNL(IGG+IGM)    EKG EKG Interpretation  Date/Time:  Wednesday October 29 2021 04:27:10 EDT Ventricular Rate:  104 PR Interval:  186 QRS Duration: 108 QT Interval:  349 QTC Calculation: 459 R Axis:   106 Text Interpretation: Sinus tachycardia Inferior infarct, old Confirmed by Ross Marcus ([id number]) on 10/29/2021 4:49:51 AM  Radiology CT Head Wo Contrast  Result Date: 10/29/2021 CLINICAL DATA:  72 year old male with dizziness, falls, altered mental status. EXAM: CT HEAD WITHOUT CONTRAST TECHNIQUE: Contiguous axial images were obtained from the base of the skull through the vertex without intravenous contrast. RADIATION DOSE REDUCTION: This exam was performed according to the departmental dose-optimization program which includes automated exposure control, adjustment of the mA and/or kV according to patient size and/or use of iterative reconstruction technique. COMPARISON:  None. FINDINGS: Brain: Cerebral volume is within normal limits for age. No midline shift, ventriculomegaly, mass effect, evidence of mass lesion, intracranial hemorrhage or evidence of cortically based acute infarction. Mild for age scattered white matter hypodensity in both hemispheres. No cortical encephalomalacia identified. Mild vascular calcifications at the left basal ganglia. Otherwise normal gray-white matter differentiation.  Vascular: Minimal Calcified atherosclerosis at the skull base. No suspicious intracranial vascular hyperdensity. Skull: Negative. Sinuses/Orbits: Visualized paranasal sinuses and mastoids are well aerated. Tympanic cavities are clear. Other: Postoperative changes to both globes. No acute orbit or scalp soft tissue finding. IMPRESSION: No acute intracranial abnormality. Mild for age cerebral white matter changes, most commonly due to chronic small vessel disease. Electronically Signed   By: Odessa Fleming M.D.   On: 10/29/2021 06:26   DG Chest Portable 1 View  Result Date: 10/29/2021 CLINICAL DATA:  71 year old male with cough and dizziness. EXAM: PORTABLE CHEST 1 VIEW COMPARISON:  Chest radiographs 06/23/2013. FINDINGS: Portable AP semi upright view at 0522 hours. Kyphotic view mildly rotated to the left. Confluent combined rounded and streaky opacity extending into the right lower lung from the right hilum. Other mediastinal contours appear stable since 2014 and within normal limits. Visualized tracheal air column is within normal limits. No superimposed pneumothorax. Lower lung volumes. Pulmonary vascular congestion without overt edema. No air bronchograms. Left lung base appears negative. No definite pleural effusion. No acute osseous abnormality identified. IMPRESSION: 1. Abnormal right hilum and streaky opacity extending from the hilum into the right lower lung. PA and lateral chest x-rays would be helpful and could be compared to those in 2014. Differential considerations at this point include lung mass and bronchopneumonia. 2. Pulmonary vascular congestion without overt edema. No definite pleural effusion. Electronically Signed   By: Odessa Fleming M.D.   On: 10/29/2021 05:58    Procedures Procedures    Medications Ordered in ED Medications  sodium chloride (PF) 0.9 % injection (has no administration in time range)  0.9 %  sodium chloride infusion (has no administration in time range)  iohexol (OMNIPAQUE) 300  MG/ML solution 75 mL (75 mLs Intravenous Contrast Given 10/29/21 0654)    ED Course/ Medical Decision Making/ A&P                           Medical Decision Making Amount and/or Complexity of Data Reviewed Labs: ordered. Radiology: ordered.  Risk Prescription drug management.  This patient presents to the ED for concern of cough, dizziness, gait disturbance, incontinence, this involves an extensive number of treatment options, and is a complaint that carries with it a high risk of complications and morbidity.  The differential diagnosis includes acute infection, pneumonia, cancer, stroke, encephalopathy, normal pressure hydrocephalus  MDM:    71 year old male presents with multiple complaints.  He is overall nontoxic.  Vital signs are largely reassuring.  He is afebrile.  He has a rash in addition to the stated complaints.  Unclear how this fits in.  He does appear slightly off.  He has some difficulty finding his words.  He is not overtly dysmetric.  He is only oriented x2.  He has had worsening incontinence and gait disturbance.  Question normal pressure hydrocephalus.  He is not encephalopathic.  He denies any alcohol or drug abuse.  Labs obtained.  BMP with slight hyponatremia, normal CBC, normal ammonia, alcohol and UDS negative.  Urinalysis without evidence of UTI.  CT head without mass.  Chest x-ray shows abnormal right hilum and streaky opacity extending into the right lower lobe.  Considerations include lung mass or pneumonia.  To better differentiate, will obtain CT chest.  This is pending.  Feel patient will likely will need an MRI given his vague neurologic disturbance and gait disturbance.  RMSF studies obtained given rash. (Labs, imaging)  Labs: I Ordered, and personally interpreted labs.  The pertinent results include: Largely reassuring labs including CBC, CMP, ammonia  Imaging Studies ordered: I ordered imaging studies including abnormal x-ray, CT scan reassuring, chest CT  pending I independently visualized and interpreted imaging. I agree with the radiologist interpretation  Additional history obtained from wife and daughter.  External records from outside source obtained and reviewed including prior evaluations  Critical Interventions: Fluids  Consultations: I requested consultation with the NA,  and discussed lab and imaging findings as well as pertinent plan - they recommend: N/A  Cardiac Monitoring: The patient was maintained on a cardiac monitor.  I personally viewed and interpreted the cardiac monitored which showed an underlying rhythm of: Sinus rhythm  Reevaluation: After the interventions noted above, I reevaluated the patient and found that they have :stayed the same   Considered admission for: Pending  Social Determinants of Health: Patient lives independently with wife  Disposition: Pending  Co morbidities that complicate the patient evaluation  Past Medical History:  Diagnosis Date   GERD (gastroesophageal reflux disease)    Hyperlipidemia    Hypertension      Medicines Meds ordered this encounter  Medications   sodium chloride (PF) 0.9 % injection    Belva Crome M: cabinet override   iohexol (OMNIPAQUE) 300 MG/ML solution 75 mL   0.9 %  sodium chloride infusion    I have reviewed the patients home medicines and have made adjustments as needed  Problem List / ED Course: Problem List Items Addressed This Visit   None Visit Diagnoses     Acute cough    -  Primary   Urinary incontinence, unspecified type       Gait disturbance                       Final Clinical Impression(s) / ED Diagnoses Final diagnoses:  Acute cough  Urinary incontinence, unspecified type  Gait disturbance    Rx / DC Orders ED Discharge Orders     None         Matheu Ploeger, Mayer Masker, MD 10/29/21  0704 ° °

## 2021-10-29 NOTE — Care Plan (Signed)
Attempted to pick up Patient for his Stat MRI. Tech extender waited for 30 mins while in the ED. Physician was in with the pt at that time. After 30 mins of MRI delay MRI had to go on to their next pt. We will come back to him as soon as MRI has a open table time for his exam.  ?

## 2021-10-29 NOTE — Progress Notes (Signed)
?  Transition of Care (TOC) Screening Note ? ? ?Patient Details  ?Name: Howard Simmons ?Date of Birth: [birth year] ? ? ?Transition of Care (TOC) CM/SW Contact:    ?Garnie Borchardt, LCSW ?Phone Number: ?10/29/2021, 1:35 PM ? ? ? ?Transition of Care Department [facility]) has reviewed patient and no TOC needs have been identified at this time. We will continue to monitor patient advancement through interdisciplinary progression rounds. If new patient transition needs arise, please place a TOC consult. ? ? ?

## 2021-10-29 NOTE — ED Notes (Signed)
Patient transported to MRI 

## 2021-10-29 NOTE — ED Provider Notes (Signed)
Care assumed from Dr. Dina Rich at shift change, please see her note for full details, but in brief, Howard Simmons is a 71 y.o. male presents with dizziness and recurrent falls, but also reports nonproductive cough and chills and then started feeling quite poorly over the past few days.  No known fevers.  Yesterday developed a rash on the back and chest.  Has been having some ongoing urinary incontinence which wife reports is gotten worse recently. ? ?Initial work-up without leukocytosis or significant laboratory derangements aside from some mild hyponatremia, but initial chest x-ray showed pneumonia versus mass, CT of the head was unremarkable. ? ?At shift change CT of the chest is pending to better delineate whether this is a mass or pneumonia, patient will need MRI imaging of the head but if CT shows mass would likely need this with contrast to assess for potential metastasis given neurologic symptoms, otherwise will need MRI to rule out posterior circulation stroke, also consider normal pressure hydrocephalus but no enlarged ventricles on CT. ? ?BP (!) 148/78   Pulse 96   Temp 99 ?F (37.2 ?C) (Oral)   Resp 17   Ht '5\' 9"'$  (1.753 m)   Wt 102.5 kg   SpO2 94%   BMI 33.37 kg/m?  ? ? ? ?Procedures  ?Procedures ? ?ED Course / MDM  ?  ?Labs Reviewed  ?BASIC METABOLIC PANEL - Abnormal; Notable for the following components:  ?    Result Value  ? Sodium 130 (*)   ? Potassium 3.4 (*)   ? CO2 18 (*)   ? Glucose, Bld 151 (*)   ? Calcium 8.6 (*)   ? All other components within normal limits  ?URINALYSIS, ROUTINE W REFLEX MICROSCOPIC - Abnormal; Notable for the following components:  ? Specific Gravity, Urine 1.035 (*)   ? Glucose, UA >=500 (*)   ? Ketones, ur 20 (*)   ? All other components within normal limits  ?CBG MONITORING, ED - Abnormal; Notable for the following components:  ? Glucose-Capillary 155 (*)   ? All other components within normal limits  ?RESP PANEL BY RT-PCR (FLU A&B, COVID) ARPGX2  ?CBC  ?AMMONIA   ?ETHANOL  ?RAPID URINE DRUG SCREEN, HOSP PERFORMED  ?ROCKY MTN SPOTTED FVR ABS PNL(IGG+IGM)  ? ?CT Head Wo Contrast ? ?Result Date: 10/29/2021 ?CLINICAL DATA:  71 year old male with dizziness, falls, altered mental status. EXAM: CT HEAD WITHOUT CONTRAST TECHNIQUE: Contiguous axial images were obtained from the base of the skull through the vertex without intravenous contrast. RADIATION DOSE REDUCTION: This exam was performed according to the departmental dose-optimization program which includes automated exposure control, adjustment of the mA and/or kV according to patient size and/or use of iterative reconstruction technique. COMPARISON:  None. FINDINGS: Brain: Cerebral volume is within normal limits for age. No midline shift, ventriculomegaly, mass effect, evidence of mass lesion, intracranial hemorrhage or evidence of cortically based acute infarction. Mild for age scattered white matter hypodensity in both hemispheres. No cortical encephalomalacia identified. Mild vascular calcifications at the left basal ganglia. Otherwise normal gray-white matter differentiation. Vascular: Minimal Calcified atherosclerosis at the skull base. No suspicious intracranial vascular hyperdensity. Skull: Negative. Sinuses/Orbits: Visualized paranasal sinuses and mastoids are well aerated. Tympanic cavities are clear. Other: Postoperative changes to both globes. No acute orbit or scalp soft tissue finding. IMPRESSION: No acute intracranial abnormality. Mild for age cerebral white matter changes, most commonly due to chronic small vessel disease. Electronically Signed   By: Genevie Ann M.D.   On: 10/29/2021  06:26  ? ?CT Chest W Contrast ? ?Result Date: 10/29/2021 ?CLINICAL DATA:  71 year old male with nonproductive cough. Subjective fever and chills for 3 days. Recently tested negative for influenza and COVID-19. Abnormal right hilum and lung base on portable chest x-ray today. EXAM: CT CHEST WITH CONTRAST TECHNIQUE: Multidetector CT  imaging of the chest was performed during intravenous contrast administration. RADIATION DOSE REDUCTION: This exam was performed according to the departmental dose-optimization program which includes automated exposure control, adjustment of the mA and/or kV according to patient size and/or use of iterative reconstruction technique. CONTRAST:  28m OMNIPAQUE IOHEXOL 300 MG/ML  SOLN COMPARISON:  Portable chest 0522 hours. Prior chest radiographs 06/23/2013. FINDINGS: Cardiovascular: No cardiomegaly or pericardial effusion. Mild Calcified aortic atherosclerosis. Some calcified coronary artery atherosclerosis is evident on series 2, image 84. Central pulmonary arteries appear patent. Mediastinum/Nodes: No mediastinal lymphadenopathy, and only small, reactive appearing right hilar lymph nodes are present individually up to 7 mm short axis. Negative left hilum. Lungs/Pleura: Major airways are patent with mild respiratory motion. The superior segment of the right lower lobe remains aerated. There is consolidation throughout the medial and posterior basal segments of the right lower lobe, with poorly enhancing lung parenchyma there (series 2, image 93). Some air bronchograms. Other regional patchy and indistinct sub solid peribronchial lower lobe opacity. The anterior basal segment is spared. No similar airspace disease elsewhere. Other pulmonary lobes are well aerated. There is minor atelectasis or scarring in the lingula. Trace if any layering right pleural fluid. Upper Abdomen: Possible hepatic steatosis but otherwise negative visible liver, gallbladder, spleen, pancreas, adrenal glands, kidneys, and bowel in the upper abdomen. Musculoskeletal: Widespread flowing endplate osteophytes resulting in multilevel thoracic spine ankylosis. Mild chronic appearing T2 compression fracture. No acute or suspicious osseous lesion identified. IMPRESSION: 1. Right lower lobe consolidation most compatible with Pneumonia. Small but  reactive appearing right hilar lymph nodes. Trace associated layering right pleural fluid. Followup PA and lateral chest X-ray is recommended in 3-4 weeks following trial of antibiotic therapy to ensure resolution and exclude underlying malignancy. 2. No other acute finding in the chest. Mild calcified atherosclerosis. Electronically Signed   By: HGenevie AnnM.D.   On: 10/29/2021 07:34  ? ?DG Chest Portable 1 View ? ?Result Date: 10/29/2021 ?CLINICAL DATA:  71year old male with cough and dizziness. EXAM: PORTABLE CHEST 1 VIEW COMPARISON:  Chest radiographs 06/23/2013. FINDINGS: Portable AP semi upright view at 0522 hours. Kyphotic view mildly rotated to the left. Confluent combined rounded and streaky opacity extending into the right lower lung from the right hilum. Other mediastinal contours appear stable since 2014 and within normal limits. Visualized tracheal air column is within normal limits. No superimposed pneumothorax. Lower lung volumes. Pulmonary vascular congestion without overt edema. No air bronchograms. Left lung base appears negative. No definite pleural effusion. No acute osseous abnormality identified. IMPRESSION: 1. Abnormal right hilum and streaky opacity extending from the hilum into the right lower lung. PA and lateral chest x-rays would be helpful and could be compared to those in 2014. Differential considerations at this point include lung mass and bronchopneumonia. 2. Pulmonary vascular congestion without overt edema. No definite pleural effusion. Electronically Signed   By: HGenevie AnnM.D.   On: 10/29/2021 05:58   ? ? ?MDM ? ?I have personally visualized and interpreted chest CT which shows right lower lobe consolidation, more compatible with pneumonia rather than mass, hilar lymphadenopathy is likely reactive. ? ?Given CT without signs of mass will proceed with MRI  of the brain without contrast.  Antibiotics ordered for community-acquired pneumonia. ? ?Discussed results of work-up thus far with  patient and family at bedside and they are in agreement with plan for admission. ? ?I requested consultation with the hospitalist service for admission and discussed case including pertinent labs, imaging and plan with Dr. Raliegh Ip

## 2021-10-29 NOTE — Evaluation (Addendum)
Occupational Therapy Evaluation ?Patient Details ?Name: Howard Simmons ?MRN: [id number] ?DOB: 02-28-1951 ?Today's Date: 10/29/2021 ? ? ?History of Present Illness Patient is a 71 year old male who presented to the hospital with 5 day history of cough and dizziness. patient was found to have LLL PNA, adenovirus. PMH: HLD, DM II, HTN, COPD.  ? ?Clinical Impression ?  ?Patient is a 71 year old male who was noted to have multiple falls prior to admission per patient report.  Patient was living at home independently with wife prior level. Currently, patient is anxious about having a hospital bill with noted HR increased to 135 bpm with toileting tasks. Nurse made aware. Patient was noted to have decreased functional activity tolerance, decreased endurance, decreased standing balance, decreased safety awareness, decreased cardiopulmonary tolerance impacting participation in ADLs. Patient could benefit from AIR prior to transition home to increase ability to participate in ADLs and safety. Patient would continue to benefit from skilled OT services at this time while admitted and after d/c to address noted deficits in order to improve overall safety and independence in ADLs.  ? ?   ? ?Recommendations for follow up therapy are one component of a multi-disciplinary discharge planning process, led by the attending physician.  Recommendations may be updated based on patient status, additional functional criteria and insurance authorization.  ? ?Follow Up Recommendations ? Acute inpatient rehab (3hours/day)  ?  ?Assistance Recommended at Discharge Frequent or constant Supervision/Assistance  ?Patient can return home with the following Assistance with cooking/housework;Direct supervision/assist for financial management;Assist for transportation;Help with stairs or ramp for entrance;Direct supervision/assist for medications management;A little help with bathing/dressing/bathroom;A little help with walking and/or transfers ? ?   ?Functional Status Assessment ? Patient has had a recent decline in their functional status and demonstrates the ability to make significant improvements in function in a reasonable and predictable amount of time.  ?Equipment Recommendations ? None recommended by OT  ?  ?Recommendations for Other Services   ? ? ?  ?Precautions / Restrictions Precautions ?Precautions: Fall ?Precaution Comments: monitor HR and O2 ?Restrictions ?Weight Bearing Restrictions: No  ? ?  ? ?Mobility Bed Mobility ?Overal bed mobility: Modified Independent ?  ?  ?  ?  ?  ?  ?General bed mobility comments: with increased time and HOB raised ?  ? ?Transfers ?  ?  ?  ?  ?  ?  ?  ?  ?  ?  ?  ? ?  ?Balance Overall balance assessment: Needs assistance ?Sitting-balance support: No upper extremity supported, Feet unsupported ?Sitting balance-Leahy Scale: Poor ?Sitting balance - Comments: noted to have posterior leaning and difficulty with maintaining upright sitting during don/doff socks EOB ?  ?Standing balance support: Single extremity supported ?Standing balance-Leahy Scale: Fair ?Standing balance comment: standing to urinate in bathroom ?  ?  ?  ?  ?  ?  ?  ?  ?  ?  ?  ?   ? ?ADL either performed or assessed with clinical judgement  ? ?ADL Overall ADL's : Needs assistance/impaired ?Eating/Feeding: Set up;Sitting ?  ?Grooming: Wash/dry face;Sitting;Set up ?  ?Upper Body Bathing: Set up;Sitting ?  ?Lower Body Bathing: Minimal assistance;Sit to/from stand;Sitting/lateral leans ?  ?Upper Body Dressing : Set up;Sitting ?  ?Lower Body Dressing: Sit to/from stand;Sitting/lateral leans;Min guard;Minimal assistance ?  ?Toilet Transfer: Min guard;Ambulation;Grab bars ?Toilet Transfer Details (indicate cue type and reason): to stand to urinate in bathroom with min guard. patient was noted to be unsteady reaching for  walls and objects to steady himself if needed  with no LOB noted. patient was educated on having staff assistance for toileting at this time.  HR noted to icnrease to 135 bpm after toileting with O2 98% on RA. returned to bed. ?Toileting- Water quality scientist and Hygiene: Min guard;Sit to/from stand ?  ?  ?  ?Functional mobility during ADLs: Min guard ?General ADL Comments: functional mobility in room with no AD with patient reaching out for walls but not making contact with them  ? ? ? ?Vision Baseline Vision/History: 1 Wears glasses ?Patient Visual Report: No change from baseline ?   ?   ?Perception   ?  ?Praxis   ?  ? ?Pertinent Vitals/Pain Pain Assessment ?Pain Assessment: No/denies pain  ? ? ? ?Hand Dominance Right ?  ?Extremity/Trunk Assessment Upper Extremity Assessment ?Upper Extremity Assessment: Overall WFL for tasks assessed ?  ?Lower Extremity Assessment ?Lower Extremity Assessment: Defer to PT evaluation ?  ?Cervical / Trunk Assessment ?Cervical / Trunk Assessment: Normal ?  ?Communication Communication ?Communication: HOH ?  ?Cognition Arousal/Alertness: Awake/alert ?Behavior During Therapy: [facility] for tasks assessed/performed ?Overall Cognitive Status: Within Functional Limits for tasks assessed ?  ?  ?  ?  ?  ?  ?  ?  ?  ?  ?  ?  ?  ?  ?  ?  ?General Comments: patient is plesant and oriented at this time. ?  ?  ?General Comments    ? ?  ?Exercises   ?  ?Shoulder Instructions    ? ? ?Home Living Family/patient expects to be discharged to:: Private residence ?Living Arrangements: Spouse/significant other ?Available Help at Discharge: Family ?Type of Home: House ?Home Access: Stairs to enter ?Entrance Stairs-Number of Steps: 3 ?  ?Home Layout: One level ?  ?  ?Bathroom Shower/Tub: Tub/shower unit ?  ?  ?  ?  ?Home Equipment: None ?  ?  ?  ? ?  ?Prior Functioning/Environment Prior Level of Function : Independent/Modified Independent ?  ?  ?  ?  ?  ?  ?  ?  ?  ? ?  ?  ?OT Problem List: Decreased activity tolerance;Impaired balance (sitting and/or standing);Decreased safety awareness;Cardiopulmonary status limiting activity;Decreased knowledge of  precautions;Decreased knowledge of use of DME or AE;Decreased strength ?  ?   ?OT Treatment/Interventions: Self-care/ADL training;Therapeutic exercise;Neuromuscular education;Energy conservation;DME and/or AE instruction;Therapeutic activities;Balance training;Patient/family education  ?  ?OT Goals(Current goals can be found in the care plan section) Acute Rehab OT Goals ?Patient Stated Goal: to go home today ?OT Goal Formulation: With patient ?Time For Goal Achievement: 11/12/21 ?Potential to Achieve Goals: Fair  ?OT Frequency: Min 2X/week ?  ? ?Co-evaluation   ?  ?  ?  ?  ? ?  ?AM-PAC OT "6 Clicks" Daily Activity     ?Outcome Measure Help from another person eating meals?: None ?Help from another person taking care of personal grooming?: A Little ?Help from another person toileting, which includes using toliet, bedpan, or urinal?: A Little ?Help from another person bathing (including washing, rinsing, drying)?: A Little ?Help from another person to put on and taking off regular upper body clothing?: A Little ?Help from another person to put on and taking off regular lower body clothing?: A Little ?6 Click Score: 19 ?  ?End of Session Nurse Communication: Other (comment) (HR and mobility status) ? ?Activity Tolerance: Patient tolerated treatment well ?Patient left: in bed;with call bell/phone within reach;with bed alarm set ? ?OT Visit Diagnosis: Unsteadiness on  feet (R26.81);Other abnormalities of gait and mobility (R26.89);Repeated falls (R29.6);Muscle weakness (generalized) (M62.81)  ?              ?Time: 3546-5681 ?OT Time Calculation (min): 19 min ?Charges:  OT General Charges ?$OT Visit: 1 Visit ?OT Evaluation ?$OT Eval Moderate Complexity: 1 Mod ? ?Shadavia Dampier OTR/L, MS ?Acute Rehabilitation Department ?Office# [phone] ?Pager# [phone] ? ? ?[place] ?10/29/2021, 3:53 PM ?

## 2021-10-30 DIAGNOSIS — I10 Essential (primary) hypertension: Secondary | ICD-10-CM | POA: Diagnosis not present

## 2021-10-30 DIAGNOSIS — E876 Hypokalemia: Secondary | ICD-10-CM | POA: Diagnosis not present

## 2021-10-30 DIAGNOSIS — E872 Acidosis, unspecified: Secondary | ICD-10-CM

## 2021-10-30 DIAGNOSIS — R21 Rash and other nonspecific skin eruption: Secondary | ICD-10-CM

## 2021-10-30 DIAGNOSIS — E871 Hypo-osmolality and hyponatremia: Secondary | ICD-10-CM | POA: Diagnosis not present

## 2021-10-30 DIAGNOSIS — R531 Weakness: Secondary | ICD-10-CM | POA: Diagnosis not present

## 2021-10-30 DIAGNOSIS — J189 Pneumonia, unspecified organism: Secondary | ICD-10-CM | POA: Diagnosis not present

## 2021-10-30 LAB — COMPREHENSIVE METABOLIC PANEL
ALT: 26 U/L (ref 0–44)
AST: 48 U/L — ABNORMAL HIGH (ref 15–41)
Albumin: 3.4 g/dL — ABNORMAL LOW (ref 3.5–5.0)
Alkaline Phosphatase: 43 U/L (ref 38–126)
Anion gap: 9 (ref 5–15)
BUN: 14 mg/dL (ref 8–23)
CO2: 22 mmol/L (ref 22–32)
Calcium: 8.5 mg/dL — ABNORMAL LOW (ref 8.9–10.3)
Chloride: 102 mmol/L (ref 98–111)
Creatinine, Ser: 0.97 mg/dL (ref 0.61–1.24)
GFR, Estimated: >60 mL/min (ref >60)
Glucose, Bld: 110 mg/dL — ABNORMAL HIGH (ref 70–99)
Potassium: 4.2 mmol/L (ref 3.5–5.1)
Sodium: 133 mmol/L — ABNORMAL LOW (ref 135–145)
Total Bilirubin: 0.4 mg/dL (ref 0.3–1.2)
Total Protein: 6.4 g/dL — ABNORMAL LOW (ref 6.5–8.1)

## 2021-10-30 LAB — CBC
HCT: 42 % (ref 39.0–52.0)
Hemoglobin: 14 g/dL (ref 13.0–17.0)
MCH: 29.9 pg (ref 26.0–34.0)
MCHC: 33.3 g/dL (ref 30.0–36.0)
MCV: 89.6 fL (ref 80.0–100.0)
Platelets: 184 10*3/uL (ref 150–400)
RBC: 4.69 MIL/uL (ref 4.22–5.81)
RDW: 13.6 % (ref 11.5–15.5)
WBC: 4.6 10*3/uL (ref 4.0–10.5)
nRBC: 0 % (ref 0.0–0.2)

## 2021-10-30 LAB — MAGNESIUM: Magnesium: 1.9 mg/dL (ref 1.7–2.4)

## 2021-10-30 LAB — GLUCOSE, CAPILLARY
Glucose-Capillary: 124 mg/dL — ABNORMAL HIGH (ref 70–99)
Glucose-Capillary: 207 mg/dL — ABNORMAL HIGH (ref 70–99)
Glucose-Capillary: 191 mg/dL — ABNORMAL HIGH (ref 70–99)
Glucose-Capillary: 104 mg/dL — ABNORMAL HIGH (ref 70–99)

## 2021-10-30 LAB — LEGIONELLA PNEUMOPHILA SEROGP 1 UR AG: L. pneumophila Serogp 1 Ur Ag: NEGATIVE

## 2021-10-30 LAB — HEMOGLOBIN A1C
Hgb A1c MFr Bld: 7.2 % — ABNORMAL HIGH (ref 4.8–5.6)
Mean Plasma Glucose: 160 mg/dL

## 2021-10-30 NOTE — Progress Notes (Signed)
Inpatient Rehab Admissions Coordinator:  ? ?Consult received and chart reviewed.  Pt lacks the medical necessity to support a CIR admission and [employer] unlikely to approve admission.  Will sign off at this time.   ? ?Shann Medal, PT, DPT ?Admissions Coordinator ?[phone] ?10/30/21  ?2:36 PM ? ?

## 2021-10-30 NOTE — Assessment & Plan Note (Addendum)
No evidence of exacerbation. Continue home albuterol. ?

## 2021-10-30 NOTE — Assessment & Plan Note (Signed)
Mild. Resolved with repletion. ?

## 2021-10-30 NOTE — Progress Notes (Signed)
? ?PROGRESS NOTE ? ? ? ?Howard Simmons  DJM:[id number] DOB: 08/29/1950 DOA: 10/29/2021 ?PCP: Harlan Stains, MD ? ? ?Brief Narrative: ?Howard Simmons is a 71 y.o. male with a history of hyperlipidemia, diabetes mellitus, hypertension, COPD. Patient presented secondary to dizziness and cough and found to have RLL pneumonia. Empiric antibiotics initiated. ? ? ?Assessment and Plan: ?* Right lower lobe pneumonia ?Identified on CT chest. Patient was started on Ceftriaxone and azithromycin on admission. Currently not hypoxic. ?-Continue Ceftriaxone and azithromycin ? ?GERD (gastroesophageal reflux disease) ?-Continue Protonix (substituted for home Prilosec) ? ?Macular rash ?Likely heat rash. Seems to be improving. Asymptomatic. ? ?HTN (hypertension) ?Patient is on losartan as an outpatient. ?-Continue Losartan ? ?COPD (chronic obstructive pulmonary disease) ([place]) ?No evidence of exacerbation. ?-Continue albuterol PRN ? ?Hyponatremia ?Mild. Stable. ? ?Hypokalemia ?Mild. Resolved with repletion. ? ?Generalized weakness ?Unsure of etiology but possibly related to acute illness. OT is recommending acute inpatient rehab. PT recommendations are pending. ? ?DM2 (diabetes mellitus, type 2) ([place]) ?Patient is on [place] and metformin as an outpatient which have been held on admission. Patient started on SSI ?-Continue SSI ? ?Metabolic acidosis-resolved as of 10/30/2021 ?Mild. Resolved with IV fluids. ? ? ? ? ?DVT prophylaxis: Lovenox ?Code Status:   Code Status: Full Code ?Family Communication: Wife on telephone ?Disposition Plan: Discharge possibly in 1 day, although plan for possible inpatient rehabilitation discharge when medically stable. ? ? ?Consultants:  ?None ? ?Procedures:  ?None ? ?Antimicrobials: ?Ceftriaxone ?Azithromycin  ? ? ?Subjective: ?Patient reports hoping to go home soon. No dyspnea today. Confused about how long he has been in the hospital.  ? ?Objective: ?BP 140/68 (BP Location: Left Arm)   Pulse 96   Temp  99.4 ?F (37.4 ?C) (Oral)   Resp 18   Ht '5\' 9"'$  (1.753 m)   Wt 102.5 kg   SpO2 94%   BMI 33.37 kg/m?  ? ?Examination: ? ?General exam: Appears calm and comfortable ?Respiratory system: Clear to auscultation. Respiratory effort normal. ?Cardiovascular system: S1 & S2 heard, RRR. No murmurs, rubs, gallops or clicks. ?Gastrointestinal system: Abdomen is nondistended, soft and nontender. No organomegaly or masses felt. Normal bowel sounds heard. ?Central nervous system: Alert and oriented to person, and year. He was confused about the specific hospital and the general date. No focal neurological deficits. ?Musculoskeletal: No edema. No calf tenderness ?Skin: No cyanosis. No rashes  ? ? ?Data Reviewed: I have personally reviewed following labs and imaging studies ? ?CBC ?Lab Results  ?Component Value Date  ? WBC 4.6 10/30/2021  ? RBC 4.69 10/30/2021  ? HGB 14.0 10/30/2021  ? HCT 42.0 10/30/2021  ? MCV 89.6 10/30/2021  ? MCH 29.9 10/30/2021  ? PLT 184 10/30/2021  ? MCHC 33.3 10/30/2021  ? RDW 13.6 10/30/2021  ? ? ? ?Last metabolic panel ?Lab Results  ?Component Value Date  ? NA 133 (L) 10/30/2021  ? K 4.2 10/30/2021  ? CL 102 10/30/2021  ? CO2 22 10/30/2021  ? BUN 14 10/30/2021  ? CREATININE 0.97 10/30/2021  ? GLUCOSE 110 (H) 10/30/2021  ? GFRNONAA >60 10/30/2021  ? CALCIUM 8.5 (L) 10/30/2021  ? PROT 6.4 (L) 10/30/2021  ? ALBUMIN 3.4 (L) 10/30/2021  ? BILITOT 0.4 10/30/2021  ? ALKPHOS 43 10/30/2021  ? AST 48 (H) 10/30/2021  ? ALT 26 10/30/2021  ? ANIONGAP 9 10/30/2021  ? ? ?GFR: ?Estimated Creatinine Clearance: 84.8 mL/min (by C-G formula based on SCr of 0.97 mg/dL). ? ?Recent Results (from the past  240 hour(s))  ?Resp Panel by RT-PCR (Flu A&B, Covid) Nasopharyngeal Swab     Status: None  ? Collection Time: 10/29/21  4:20 AM  ? Specimen: Nasopharyngeal Swab; Nasopharyngeal(NP) swabs in vial transport medium  ?Result Value Ref Range Status  ? SARS Coronavirus 2 by RT PCR NEGATIVE NEGATIVE Final  ?  Comment:  (NOTE) ?SARS-CoV-2 target nucleic acids are NOT DETECTED. ? ?The SARS-CoV-2 RNA is generally detectable in upper respiratory ?specimens during the acute phase of infection. The lowest ?concentration of SARS-CoV-2 viral copies this assay can detect is ?138 copies/mL. A negative result does not preclude SARS-Cov-2 ?infection and should not be used as the sole basis for treatment or ?other patient management decisions. A negative result may occur with  ?improper specimen collection/handling, submission of specimen other ?than nasopharyngeal swab, presence of viral mutation(s) within the ?areas targeted by this assay, and inadequate number of viral ?copies(<138 copies/mL). A negative result must be combined with ?clinical observations, patient history, and epidemiological ?information. The expected result is Negative. ? ?Fact Sheet for Patients:  ?EntrepreneurPulse.com.au ? ?Fact Sheet for Healthcare Providers:  ?IncredibleEmployment.be ? ?This test is no t yet approved or cleared by the [place] FDA and  ?has been authorized for detection and/or diagnosis of SARS-CoV-2 by ?FDA under an Emergency Use Authorization (EUA). This EUA will remain  ?in effect (meaning this test can be used) for the duration of the ?COVID-19 declaration under Section 564(b)(1) of the Act, 21 ?U.S.C.section 360bbb-3(b)(1), unless the authorization is terminated  ?or revoked sooner.  ? ? ?  ? Influenza A by PCR NEGATIVE NEGATIVE Final  ? Influenza B by PCR NEGATIVE NEGATIVE Final  ?  Comment: (NOTE) ?The Xpert Xpress SARS-CoV-2/FLU/RSV plus assay is intended as an aid ?in the diagnosis of influenza from Nasopharyngeal swab specimens and ?should not be used as a sole basis for treatment. Nasal washings and ?aspirates are unacceptable for Xpert Xpress SARS-CoV-2/FLU/RSV ?testing. ? ?Fact Sheet for Patients: ?EntrepreneurPulse.com.au ? ?Fact Sheet for Healthcare  Providers: ?IncredibleEmployment.be ? ?This test is not yet approved or cleared by the [place] FDA and ?has been authorized for detection and/or diagnosis of SARS-CoV-2 by ?FDA under an Emergency Use Authorization (EUA). This EUA will remain ?in effect (meaning this test can be used) for the duration of the ?COVID-19 declaration under Section 564(b)(1) of the Act, 21 U.S.C. ?section 360bbb-3(b)(1), unless the authorization is terminated or ?revoked. ? ?Performed at [facility], [address] ?[place], [place] [address] ?  ?Respiratory (~20 pathogens) panel by PCR     Status: Abnormal  ? Collection Time: 10/29/21  9:25 AM  ? Specimen: Respiratory  ?Result Value Ref Range Status  ? Adenovirus DETECTED (A) NOT DETECTED Final  ? Coronavirus 229E NOT DETECTED NOT DETECTED Final  ?  Comment: (NOTE) ?The Coronavirus on the Respiratory Panel, DOES NOT test for the novel  ?Coronavirus (2019 nCoV) ?  ? Coronavirus HKU1 NOT DETECTED NOT DETECTED Final  ? Coronavirus NL63 NOT DETECTED NOT DETECTED Final  ? Coronavirus OC43 NOT DETECTED NOT DETECTED Final  ? Metapneumovirus NOT DETECTED NOT DETECTED Final  ? Rhinovirus / Enterovirus NOT DETECTED NOT DETECTED Final  ? Influenza A NOT DETECTED NOT DETECTED Final  ? Influenza B NOT DETECTED NOT DETECTED Final  ? Parainfluenza Virus 1 NOT DETECTED NOT DETECTED Final  ? Parainfluenza Virus 2 NOT DETECTED NOT DETECTED Final  ? Parainfluenza Virus 3 NOT DETECTED NOT DETECTED Final  ? Parainfluenza Virus 4 NOT DETECTED NOT  DETECTED Final  ? Respiratory Syncytial Virus NOT DETECTED NOT DETECTED Final  ? Bordetella pertussis NOT DETECTED NOT DETECTED Final  ? Bordetella Parapertussis NOT DETECTED NOT DETECTED Final  ? Chlamydophila pneumoniae NOT DETECTED NOT DETECTED Final  ? Mycoplasma pneumoniae NOT DETECTED NOT DETECTED Final  ?  Comment: Performed at [facility], [place] [address] [place], [place] [address]  ?  ? ? ?Radiology  Studies: ?CT Head Wo Contrast ? ?Result Date: 10/29/2021 ?CLINICAL DATA:  71 year old male with dizziness, falls, altered mental status. EXAM: CT HEAD WITHOUT CONTRAST TECHNIQUE: Contiguous axial images were obtained from the base of the skull through the ver

## 2021-10-30 NOTE — Assessment & Plan Note (Addendum)
Continue home Prilosec. ?

## 2021-10-30 NOTE — Assessment & Plan Note (Addendum)
Likely heat rash. Improved. Asymptomatic. ?

## 2021-10-30 NOTE — Assessment & Plan Note (Addendum)
Unsure of etiology but possibly related to acute illness. PT recommending no follow-up. Weakness improved. ?

## 2021-10-30 NOTE — Hospital Course (Addendum)
Howard Simmons is a 71 y.o. male with a history of hyperlipidemia, diabetes mellitus, hypertension, COPD. Patient presented secondary to dizziness and cough and found to have RLL pneumonia. Empiric antibiotics initiated with improvement. ?

## 2021-10-30 NOTE — Assessment & Plan Note (Addendum)
Patient is on losartan as an outpatient. Continue home Losartan. ?

## 2021-10-30 NOTE — Assessment & Plan Note (Addendum)
Identified on CT chest. Patient was started on Ceftriaxone and azithromycin on admission. Currently not hypoxic. Transitioned to Cefdinir and azithromycin on discharged. Hycodan provided for cough. ?

## 2021-10-30 NOTE — Evaluation (Signed)
Physical Therapy Evaluation ?Patient Details ?Name: Howard Simmons ?MRN: [id number] ?DOB: [birth year] ?Today's Date: 10/30/2021 ? ?History of Present Illness ? Patient is a 71 year old male who presented to the hospital with 5 day history of cough and dizziness. patient was found to have LLL PNA, adenovirus. PMH: HLD, DM II, HTN, COPD.  ?Clinical Impression ? Pt is mobilizing well independently.  He ambulated 200' without an assistive device, no loss of balance, SpO2 95% on room air, no dyspnea noted. He is ready to DC home from a PT standpoint. No further PT indicated, will sign off.  ?   ? ?Recommendations for follow up therapy are one component of a multi-disciplinary discharge planning process, led by the attending physician.  Recommendations may be updated based on patient status, additional functional criteria and insurance authorization. ? ?Follow Up Recommendations No PT follow up ? ?  ?Assistance Recommended at Discharge None  ?Patient can return home with the following ?   ? ?  ?Equipment Recommendations None recommended by PT  ?Recommendations for Other Services ?    ?  ?Functional Status Assessment Patient has not had a recent decline in their functional status  ? ?  ?Precautions / Restrictions Precautions ?Precautions: Fall ?Restrictions ?Weight Bearing Restrictions: No  ? ?  ? ?Mobility ? Bed Mobility ?Overal bed mobility: Modified Independent ?  ?  ?  ?  ?  ?  ?General bed mobility comments: HOB up, used rail ?  ? ?Transfers ?Overall transfer level: Independent ?Equipment used: None ?  ?  ?  ?  ?  ?  ?  ?  ?  ? ?Ambulation/Gait ?Ambulation/Gait assistance: Independent ?Gait Distance (Feet): 200 Feet ?Assistive device: None ?Gait Pattern/deviations: WFL(Within Functional Limits) ?Gait velocity: WFL ?  ?  ?General Gait Details: steady without an assistive device, no loss of balance, SaO2 95% on room air ? ?Stairs ?  ?  ?  ?  ?  ? ?Wheelchair Mobility ?  ? ?Modified Rankin (Stroke Patients Only) ?  ? ?   ? ?Balance   ?  ?Sitting balance-Leahy Scale: Good ?  ?  ?  ?Standing balance-Leahy Scale: Good ?  ?  ?  ?  ?  ?  ?  ?  ?  ?  ?  ?  ?   ? ? ? ?Pertinent Vitals/Pain Pain Assessment ?Pain Assessment: No/denies pain  ? ? ?Home Living Family/patient expects to be discharged to:: Private residence ?Living Arrangements: Spouse/significant other ?Available Help at Discharge: Family;Available 24 hours/day ?Type of Home: House ?Home Access: Stairs to enter ?  ?Entrance Stairs-Number of Steps: 3 ?  ?Home Layout: One level ?Home Equipment: None ?   ?  ?Prior Function Prior Level of Function : Independent/Modified Independent;Driving ?  ?  ?  ?  ?  ?  ?Mobility Comments: walks without AD ?  ?  ? ? ?Hand Dominance  ? Dominant Hand: Right ? ?  ?Extremity/Trunk Assessment  ? Upper Extremity Assessment ?Upper Extremity Assessment: Defer to OT evaluation ?  ? ?Lower Extremity Assessment ?Lower Extremity Assessment: Overall WFL for tasks assessed ?  ? ?Cervical / Trunk Assessment ?Cervical / Trunk Assessment: Normal  ?Communication  ? Communication: HOH  ?Cognition Arousal/Alertness: Awake/alert ?Behavior During Therapy: [facility] for tasks assessed/performed ?Overall Cognitive Status: Within Functional Limits for tasks assessed ?  ?  ?  ?  ?  ?  ?  ?  ?  ?  ?  ?  ?  ?  ?  ?  ?  General Comments: patient is plesant and oriented at this time. ?  ?  ? ?  ?General Comments   ? ?  ?Exercises    ? ?Assessment/Plan  ?  ?PT Assessment Patient does not need any further PT services  ?PT Problem List   ? ?   ?  ?PT Treatment Interventions     ? ?PT Goals (Current goals can be found in the Care Plan section)  ?Acute Rehab PT Goals ?PT Goal Formulation: All assessment and education complete, DC therapy ? ?  ?Frequency   ?  ? ? ?Co-evaluation   ?  ?  ?  ?  ? ? ?  ?AM-PAC PT "6 Clicks" Mobility  ?Outcome Measure Help needed turning from your back to your side while in a flat bed without using bedrails?: None ?Help needed moving from lying on your back  to sitting on the side of a flat bed without using bedrails?: None ?Help needed moving to and from a bed to a chair (including a wheelchair)?: None ?Help needed standing up from a chair using your arms (e.g., wheelchair or bedside chair)?: None ?Help needed to walk in hospital room?: None ?Help needed climbing 3-5 steps with a railing? : None ?6 Click Score: 24 ? ?  ?End of Session Equipment Utilized During Treatment: Gait belt ?Activity Tolerance: Patient tolerated treatment well ?Patient left: in chair;with family/visitor present;with call bell/phone within reach ?Nurse Communication: Mobility status ?  ?  ? ?Time: 3154-0086 ?PT Time Calculation (min) (ACUTE ONLY): 17 min ? ? ?Charges:   PT Evaluation ?$PT Eval Low Complexity: 1 Low ?  ?  ?   ? ?Blondell Reveal Kistler PT 10/30/2021  ?Acute Rehabilitation Services ?Pager [phone] ?Office [phone] ? ? ?

## 2021-10-30 NOTE — Assessment & Plan Note (Addendum)
Patient is on [place] and metformin as an outpatient which have been held on admission. Patient started on SSI while inpatient. Resume home regimen on discharge. ?

## 2021-10-30 NOTE — Assessment & Plan Note (Signed)
Mild. Resolved with IV fluids. 

## 2021-10-30 NOTE — Assessment & Plan Note (Signed)
Mild. Stable. ?

## 2021-10-31 DIAGNOSIS — J189 Pneumonia, unspecified organism: Secondary | ICD-10-CM | POA: Diagnosis not present

## 2021-10-31 DIAGNOSIS — R531 Weakness: Secondary | ICD-10-CM | POA: Diagnosis not present

## 2021-10-31 DIAGNOSIS — J449 Chronic obstructive pulmonary disease, unspecified: Secondary | ICD-10-CM

## 2021-10-31 DIAGNOSIS — I10 Essential (primary) hypertension: Secondary | ICD-10-CM | POA: Diagnosis not present

## 2021-10-31 DIAGNOSIS — B340 Adenovirus infection, unspecified: Secondary | ICD-10-CM

## 2021-10-31 DIAGNOSIS — E872 Acidosis, unspecified: Secondary | ICD-10-CM | POA: Diagnosis not present

## 2021-10-31 DIAGNOSIS — E871 Hypo-osmolality and hyponatremia: Secondary | ICD-10-CM | POA: Diagnosis not present

## 2021-10-31 LAB — GLUCOSE, CAPILLARY
Glucose-Capillary: 76 mg/dL (ref 70–99)
Glucose-Capillary: 131 mg/dL — ABNORMAL HIGH (ref 70–99)

## 2021-10-31 LAB — ROCKY MTN SPOTTED FVR ABS PNL(IGG+IGM)
RMSF IgG: NEGATIVE
RMSF IgM: 0.32 index (ref 0.00–0.89)

## 2021-10-31 MED ORDER — ACETAMINOPHEN 325 MG PO TABS: 325.0000 mg | ORAL_TABLET | ORAL | Status: DC | PRN

## 2021-10-31 MED ORDER — GUAIFENESIN ER 600 MG PO TB12: 600.0000 mg | ORAL_TABLET | Freq: Two times a day (BID) | ORAL | 0 refills | Status: AC

## 2021-10-31 MED ORDER — HYDROCODONE BIT-HOMATROP MBR 5-1.5 MG/5ML PO SOLN
5.0000 mL | Freq: Four times a day (QID) | ORAL | 0 refills | Status: DC | PRN
Start: 2021-10-31 — End: 2024-06-12

## 2021-10-31 MED ORDER — CEFDINIR 300 MG PO CAPS
300.0000 mg | ORAL_CAPSULE | Freq: Two times a day (BID) | ORAL | 0 refills | Status: AC
Start: 2021-11-01 — End: 2021-11-03

## 2021-10-31 MED ORDER — AZITHROMYCIN 500 MG PO TABS
500.0000 mg | ORAL_TABLET | Freq: Every day | ORAL | 0 refills | Status: AC
Start: 2021-11-01 — End: 2021-11-03

## 2021-10-31 NOTE — Discharge Instructions (Addendum)
Howard Simmons, ? ?You were in the hospital with pneumonia and also found to have a viral infection. You have improved with antibiotics. Please continue antibiotics on discharge. Mobilize as tolerated. Follow-up with your primary care physician. Please return or call your PCP if you have worsening symptoms like: fever, worsening trouble breathing, very fast breathing, worsening chest pain. ?

## 2021-10-31 NOTE — Discharge Summary (Signed)
Physician Discharge Summary   Patient: Howard Simmons MRN: [id number] DOB: 1951/05/01  Admit date:     10/29/2021  Discharge date: 10/31/21  Discharge Physician: Jacquelin Hawking, MD   PCP: Laurann Montana, MD   Recommendations at discharge:   PCP follow-up Repeat chest x-ray in 3-4 weeks  Discharge Diagnoses: Principal Problem:   Right lower lobe pneumonia Active Problems:   Adenovirus infection   DM2 (diabetes mellitus, type 2) (HCC)   Generalized weakness   Falls   Hyponatremia   COPD (chronic obstructive pulmonary disease) (HCC)   HTN (hypertension)   Macular rash   GERD (gastroesophageal reflux disease)  Resolved Problems:   Hypokalemia   Metabolic acidosis  Hospital Course: Howard Simmons is a 71 y.o. male with a history of hyperlipidemia, diabetes mellitus, hypertension, COPD. Patient presented secondary to dizziness and cough and found to have RLL pneumonia. Empiric antibiotics initiated with improvement.  Assessment and Plan: * Right lower lobe pneumonia Identified on CT chest. Patient was started on Ceftriaxone and azithromycin on admission. Currently not hypoxic. Transitioned to Cefdinir and azithromycin on discharged. Hycodan provided for cough.  Adenovirus infection Likely contributing to current presentation/fevers. Supportive care.  GERD (gastroesophageal reflux disease) Continue home Prilosec.  Macular rash Likely heat rash. Improved. Asymptomatic.  HTN (hypertension) Patient is on losartan as an outpatient. Continue home Losartan.  COPD (chronic obstructive pulmonary disease) (HCC) No evidence of exacerbation. Continue home albuterol.  Hyponatremia Mild. Stable.  Generalized weakness Unsure of etiology but possibly related to acute illness. PT recommending no follow-up. Weakness improved.  DM2 (diabetes mellitus, type 2) (HCC) Patient is on [place] and metformin as an outpatient which have been held on admission. Patient started on SSI while  inpatient. Resume home regimen on discharge.  Metabolic acidosis-resolved as of 10/30/2021 Mild. Resolved with IV fluids.  Hypokalemia-resolved as of 10/31/2021 Mild. Resolved with repletion.         Consultants: None Procedures performed: None  Disposition: Home Diet recommendation: Regular diet  DISCHARGE MEDICATION: Allergies as of 10/31/2021   No Known Allergies      Medication List     STOP taking these medications    budesonide-formoterol 160-4.5 MCG/ACT inhaler Commonly known as: Symbicort   montelukast 10 MG tablet Commonly known as: SINGULAIR   omeprazole 20 MG tablet Commonly known as: PRILOSEC OTC       TAKE these medications    acetaminophen 325 MG tablet Commonly known as: TYLENOL Take 1-2 tablets (325-650 mg total) by mouth every 4 (four) hours as needed for mild pain. What changed:  how much to take when to take this   albuterol 108 (90 Base) MCG/ACT inhaler Commonly known as: VENTOLIN HFA Inhale 1-2 puffs into the lungs every 6 (six) hours as needed for wheezing or shortness of breath.   atorvastatin 20 MG tablet Commonly known as: LIPITOR Take 20 mg by mouth daily.   azithromycin 500 MG tablet Commonly known as: ZITHROMAX Take 1 tablet (500 mg total) by mouth daily for 2 days. Start taking on: November 01, 2021   cefdinir 300 MG capsule Commonly known as: OMNICEF Take 1 capsule (300 mg total) by mouth 2 (two) times daily for 2 days. Start taking on: November 01, 2021   cetirizine 10 MG tablet Commonly known as: ZYRTEC Take 10 mg by mouth daily.   DULoxetine 60 MG capsule Commonly known as: CYMBALTA Take 60 mg by mouth daily.   Farxiga 10 MG Tabs tablet Generic drug: dapagliflozin propanediol Take 10  mg by mouth daily.   fluticasone 50 MCG/ACT nasal spray Commonly known as: FLONASE Place 1 spray into both nostrils daily.   guaiFENesin 600 MG 12 hr tablet Commonly known as: MUCINEX Take 1 tablet (600 mg total) by mouth 2  (two) times daily for 5 days.   HYDROcodone bit-homatropine 5-1.5 MG/5ML syrup Commonly known as: HYCODAN Take 5 mLs by mouth every 6 (six) hours as needed for cough.   ibuprofen 200 MG tablet Commonly known as: ADVIL Take 200 mg by mouth every 6 (six) hours as needed for mild pain.   losartan 50 MG tablet Commonly known as: COZAAR Take 50 mg by mouth daily.   metFORMIN 500 MG 24 hr tablet Commonly known as: GLUCOPHAGE-XR Take 1,000 mg by mouth 2 (two) times daily.   omeprazole 20 MG capsule Commonly known as: PRILOSEC Take 20 mg by mouth daily.   Vitamin D3 50 MCG (2000 UT) Chew Chew 2,000 Units by mouth daily.        Follow-up Information     Laurann Montana, MD. Schedule an appointment as soon as possible for a visit in 1 week(s).   Specialty: Family Medicine Why: For hospital follow-up Contact information: [address] [place] [address] [phone]                Discharge Exam: Ceasar Mons Weights   10/29/21 0336  Weight: 102.5 kg   General exam: Appears calm and comfortable  Condition at discharge: stable  The results of significant diagnostics from this hospitalization (including imaging, microbiology, ancillary and laboratory) are listed below for reference.   Imaging Studies: CT Head Wo Contrast  Result Date: 10/29/2021 CLINICAL DATA:  71 year old male with dizziness, falls, altered mental status. EXAM: CT HEAD WITHOUT CONTRAST TECHNIQUE: Contiguous axial images were obtained from the base of the skull through the vertex without intravenous contrast. RADIATION DOSE REDUCTION: This exam was performed according to the departmental dose-optimization program which includes automated exposure control, adjustment of the mA and/or kV according to patient size and/or use of iterative reconstruction technique. COMPARISON:  None. FINDINGS: Brain: Cerebral volume is within normal limits for age. No midline shift, ventriculomegaly, mass effect,  evidence of mass lesion, intracranial hemorrhage or evidence of cortically based acute infarction. Mild for age scattered white matter hypodensity in both hemispheres. No cortical encephalomalacia identified. Mild vascular calcifications at the left basal ganglia. Otherwise normal gray-white matter differentiation. Vascular: Minimal Calcified atherosclerosis at the skull base. No suspicious intracranial vascular hyperdensity. Skull: Negative. Sinuses/Orbits: Visualized paranasal sinuses and mastoids are well aerated. Tympanic cavities are clear. Other: Postoperative changes to both globes. No acute orbit or scalp soft tissue finding. IMPRESSION: No acute intracranial abnormality. Mild for age cerebral white matter changes, most commonly due to chronic small vessel disease. Electronically Signed   By: Odessa Fleming M.D.   On: 10/29/2021 06:26   CT Chest W Contrast  Result Date: 10/29/2021 CLINICAL DATA:  71 year old male with nonproductive cough. Subjective fever and chills for 3 days. Recently tested negative for influenza and COVID-19. Abnormal right hilum and lung base on portable chest x-ray today. EXAM: CT CHEST WITH CONTRAST TECHNIQUE: Multidetector CT imaging of the chest was performed during intravenous contrast administration. RADIATION DOSE REDUCTION: This exam was performed according to the departmental dose-optimization program which includes automated exposure control, adjustment of the mA and/or kV according to patient size and/or use of iterative reconstruction technique. CONTRAST:  75mL OMNIPAQUE IOHEXOL 300 MG/ML  SOLN COMPARISON:  Portable chest  0522 hours. Prior chest radiographs 06/23/2013. FINDINGS: Cardiovascular: No cardiomegaly or pericardial effusion. Mild Calcified aortic atherosclerosis. Some calcified coronary artery atherosclerosis is evident on series 2, image 84. Central pulmonary arteries appear patent. Mediastinum/Nodes: No mediastinal lymphadenopathy, and only small, reactive appearing  right hilar lymph nodes are present individually up to 7 mm short axis. Negative left hilum. Lungs/Pleura: Major airways are patent with mild respiratory motion. The superior segment of the right lower lobe remains aerated. There is consolidation throughout the medial and posterior basal segments of the right lower lobe, with poorly enhancing lung parenchyma there (series 2, image 93). Some air bronchograms. Other regional patchy and indistinct sub solid peribronchial lower lobe opacity. The anterior basal segment is spared. No similar airspace disease elsewhere. Other pulmonary lobes are well aerated. There is minor atelectasis or scarring in the lingula. Trace if any layering right pleural fluid. Upper Abdomen: Possible hepatic steatosis but otherwise negative visible liver, gallbladder, spleen, pancreas, adrenal glands, kidneys, and bowel in the upper abdomen. Musculoskeletal: Widespread flowing endplate osteophytes resulting in multilevel thoracic spine ankylosis. Mild chronic appearing T2 compression fracture. No acute or suspicious osseous lesion identified. IMPRESSION: 1. Right lower lobe consolidation most compatible with Pneumonia. Small but reactive appearing right hilar lymph nodes. Trace associated layering right pleural fluid. Followup PA and lateral chest X-ray is recommended in 3-4 weeks following trial of antibiotic therapy to ensure resolution and exclude underlying malignancy. 2. No other acute finding in the chest. Mild calcified atherosclerosis. Electronically Signed   By: Odessa Fleming M.D.   On: 10/29/2021 07:34   MR BRAIN WO CONTRAST  Result Date: 10/29/2021 CLINICAL DATA:  Dizziness with unsteady gait and multiple recent falls. EXAM: MRI HEAD WITHOUT CONTRAST TECHNIQUE: Multiplanar, multiecho pulse sequences of the brain and surrounding structures were obtained without intravenous contrast. COMPARISON:  Head CT 10/29/2021 FINDINGS: Brain: There is no evidence of an acute infarct, intracranial  hemorrhage, mass, midline shift, or extra-axial fluid collection. T2 hyperintensities in the cerebral white matter bilaterally are nonspecific but compatible with moderate chronic small vessel ischemic disease. Mild cerebral atrophy is within normal limits for age. The cerebellum is normal in signal. Vascular: Major intracranial vascular flow voids are preserved. Skull and upper cervical spine: Unremarkable bone marrow signal. Sinuses/Orbits: Bilateral cataract extraction. Mild mucosal thickening in the paranasal sinuses. No significant mastoid fluid. Other: None. IMPRESSION: 1. No acute intracranial abnormality. 2. Moderate chronic small vessel ischemic disease. Electronically Signed   By: Sebastian Ache M.D.   On: 10/29/2021 11:36   DG Chest Portable 1 View  Result Date: 10/29/2021 CLINICAL DATA:  71 year old male with cough and dizziness. EXAM: PORTABLE CHEST 1 VIEW COMPARISON:  Chest radiographs 06/23/2013. FINDINGS: Portable AP semi upright view at 0522 hours. Kyphotic view mildly rotated to the left. Confluent combined rounded and streaky opacity extending into the right lower lung from the right hilum. Other mediastinal contours appear stable since 2014 and within normal limits. Visualized tracheal air column is within normal limits. No superimposed pneumothorax. Lower lung volumes. Pulmonary vascular congestion without overt edema. No air bronchograms. Left lung base appears negative. No definite pleural effusion. No acute osseous abnormality identified. IMPRESSION: 1. Abnormal right hilum and streaky opacity extending from the hilum into the right lower lung. PA and lateral chest x-rays would be helpful and could be compared to those in 2014. Differential considerations at this point include lung mass and bronchopneumonia. 2. Pulmonary vascular congestion without overt edema. No definite pleural effusion. Electronically Signed   By:  Odessa Fleming M.D.   On: 10/29/2021 05:58    Microbiology: Results for  orders placed or performed during the hospital encounter of 10/29/21  Resp Panel by RT-PCR (Flu A&B, Covid) Nasopharyngeal Swab     Status: None   Collection Time: 10/29/21  4:20 AM   Specimen: Nasopharyngeal Swab; Nasopharyngeal(NP) swabs in vial transport medium  Result Value Ref Range Status   SARS Coronavirus 2 by RT PCR NEGATIVE NEGATIVE Final    Comment: (NOTE) SARS-CoV-2 target nucleic acids are NOT DETECTED.  The SARS-CoV-2 RNA is generally detectable in upper respiratory specimens during the acute phase of infection. The lowest concentration of SARS-CoV-2 viral copies this assay can detect is 138 copies/mL. A negative result does not preclude SARS-Cov-2 infection and should not be used as the sole basis for treatment or other patient management decisions. A negative result may occur with  improper specimen collection/handling, submission of specimen other than nasopharyngeal swab, presence of viral mutation(s) within the areas targeted by this assay, and inadequate number of viral copies(<138 copies/mL). A negative result must be combined with clinical observations, patient history, and epidemiological information. The expected result is Negative.  Fact Sheet for Patients:  BloggerCourse.com  Fact Sheet for Healthcare Providers:  SeriousBroker.it  This test is no t yet approved or cleared by the [place] FDA and  has been authorized for detection and/or diagnosis of SARS-CoV-2 by FDA under an Emergency Use Authorization (EUA). This EUA will remain  in effect (meaning this test can be used) for the duration of the COVID-19 declaration under Section 564(b)(1) of the Act, 21 U.S.C.section 360bbb-3(b)(1), unless the authorization is terminated  or revoked sooner.       Influenza A by PCR NEGATIVE NEGATIVE Final   Influenza B by PCR NEGATIVE NEGATIVE Final    Comment: (NOTE) The Xpert Xpress SARS-CoV-2/FLU/RSV plus  assay is intended as an aid in the diagnosis of influenza from Nasopharyngeal swab specimens and should not be used as a sole basis for treatment. Nasal washings and aspirates are unacceptable for Xpert Xpress SARS-CoV-2/FLU/RSV testing.  Fact Sheet for Patients: BloggerCourse.com  Fact Sheet for Healthcare Providers: SeriousBroker.it  This test is not yet approved or cleared by the [place] FDA and has been authorized for detection and/or diagnosis of SARS-CoV-2 by FDA under an Emergency Use Authorization (EUA). This EUA will remain in effect (meaning this test can be used) for the duration of the COVID-19 declaration under Section 564(b)(1) of the Act, 21 U.S.C. section 360bbb-3(b)(1), unless the authorization is terminated or revoked.  Performed at [facility], [address] [place], [place] [address]   Respiratory (~20 pathogens) panel by PCR     Status: Abnormal   Collection Time: 10/29/21  9:25 AM   Specimen: Respiratory  Result Value Ref Range Status   Adenovirus DETECTED (A) NOT DETECTED Final   Coronavirus 229E NOT DETECTED NOT DETECTED Final    Comment: (NOTE) The Coronavirus on the Respiratory Panel, DOES NOT test for the novel  Coronavirus (2019 nCoV)    Coronavirus HKU1 NOT DETECTED NOT DETECTED Final   Coronavirus NL63 NOT DETECTED NOT DETECTED Final   Coronavirus OC43 NOT DETECTED NOT DETECTED Final   Metapneumovirus NOT DETECTED NOT DETECTED Final   Rhinovirus / Enterovirus NOT DETECTED NOT DETECTED Final   Influenza A NOT DETECTED NOT DETECTED Final   Influenza B NOT DETECTED NOT DETECTED Final   Parainfluenza Virus 1 NOT DETECTED NOT DETECTED Final   Parainfluenza Virus 2  NOT DETECTED NOT DETECTED Final   Parainfluenza Virus 3 NOT DETECTED NOT DETECTED Final   Parainfluenza Virus 4 NOT DETECTED NOT DETECTED Final   Respiratory Syncytial Virus NOT DETECTED NOT DETECTED Final    Bordetella pertussis NOT DETECTED NOT DETECTED Final   Bordetella Parapertussis NOT DETECTED NOT DETECTED Final   Chlamydophila pneumoniae NOT DETECTED NOT DETECTED Final   Mycoplasma pneumoniae NOT DETECTED NOT DETECTED Final    Comment: Performed at [facility], [address] [place], [place] [address]    Labs: CBC: Recent Labs  Lab 10/29/21 0420 10/30/21 0425  WBC 6.2 4.6  HGB 16.0 14.0  HCT 46.7 42.0  MCV 88.4 89.6  PLT 187 184   Basic Metabolic Panel: Recent Labs  Lab 10/29/21 0420 10/29/21 1312 10/30/21 0425  NA 130*  --  133*  K 3.4*  --  4.2  CL 98  --  102  CO2 18*  --  22  GLUCOSE 151*  --  110*  BUN 18  --  14  CREATININE 0.96  --  0.97  CALCIUM 8.6*  --  8.5*  MG  --  1.9 1.9   Liver Function Tests: Recent Labs  Lab 10/30/21 0425  AST 48*  ALT 26  ALKPHOS 43  BILITOT 0.4  PROT 6.4*  ALBUMIN 3.4*   CBG: Recent Labs  Lab 10/30/21 1200 10/30/21 1644 10/30/21 2204 10/31/21 0737 10/31/21 1138  GLUCAP 207* 191* 104* 76 131*    Discharge time spent: 35 minutes.  Signed: Jacquelin Hawking, MD [facility] 10/31/2021

## 2021-10-31 NOTE — Progress Notes (Signed)
Occupational Therapy Treatment ?Patient Details ?Name: Howard Simmons ?MRN: [id number] ?DOB: 08/25/1950 ?Today's Date: 10/31/2021 ? ? ?History of present illness Patient is a 71 year old male who presented to the hospital with 5 day history of cough and dizziness. patient was found to have LLL PNA, adenovirus. PMH: HLD, DM II, HTN, COPD. ?  ?OT comments ? Patient demonstrates independence with lower body dressing, standing at sink to perform grooming/hygiene and transfer onto toilet without any physical assistance. Patient reports plan to D/C home today. No further acute OT needs, will sign off.   ? ?Recommendations for follow up therapy are one component of a multi-disciplinary discharge planning process, led by the attending physician.  Recommendations may be updated based on patient status, additional functional criteria and insurance authorization. ?   ?Follow Up Recommendations ? No OT follow up  ?  ?Assistance Recommended at Discharge PRN  ?   ?Equipment Recommendations ? None recommended by OT  ?  ?   ?Precautions / Restrictions Precautions ?Precautions: Fall ?Precaution Comments: monitor HR and O2 ?Restrictions ?Weight Bearing Restrictions: No  ? ? ?  ? ?Mobility Bed Mobility ?Overal bed mobility: Modified Independent ?  ?  ?  ?  ?  ?  ?  ?  ? ?Transfers ?Overall transfer level: Independent ?  ?  ?  ?  ?  ?  ?  ?  ?  ?  ?  ?Balance Overall balance assessment: Independent ?  ?  ?  ?  ?  ?  ?  ?  ?  ?  ?  ?  ?  ?  ?  ?  ?  ?  ?   ? ?ADL either performed or assessed with clinical judgement  ? ?ADL Overall ADL's : Independent ?  ?  ?Grooming: Oral care;Wash/dry hands;Brushing hair;Independent;Standing;Wash/dry face ?  ?  ?  ?  ?  ?  ?  ?Lower Body Dressing: Independent;Sit to/from stand ?Lower Body Dressing Details (indicate cue type and reason): to doff/don socks ?Toilet Transfer: Independent;Ambulation;Regular Toilet ?  ?  ?  ?  ?  ?Functional mobility during ADLs: Independent ?  ?  ? ?Extremity/Trunk Assessment  Upper Extremity Assessment ?Upper Extremity Assessment: Overall WFL for tasks assessed ?  ?Lower Extremity Assessment ?Lower Extremity Assessment: Defer to PT evaluation ?  ?  ?  ? ? ?Cognition Arousal/Alertness: Awake/alert ?Behavior During Therapy: [facility] for tasks assessed/performed ?Overall Cognitive Status: Within Functional Limits for tasks assessed ?  ?  ?  ?  ?  ?  ?  ?  ?  ?  ?  ?  ?  ?  ?  ?  ?  ?  ?  ?   ?   ? ?  ?   ? ? ?  ?    ? ? ?Pertinent Vitals/ Pain       Pain Assessment ?Pain Assessment: No/denies pain ? ?   ?  ?  ?  ?  ?  ?  ?  ?  ?  ?  ?  ?  ?  ?  ?  ?  ?  ?  ? ?  ?    ?  ?  ?  ?   ? ? ?    ? ? ? ? ?  ?Progress Toward Goals ? ?OT Goals(current goals can now be found in the care plan section) ? Progress towards OT goals: Goals met/education completed, patient discharged from OT ? ?Acute Rehab OT Goals ?  Patient Stated Goal: Home today ?OT Goal Formulation: All assessment and education complete, DC therapy  ?Plan Discharge plan needs to be updated   ? ?AM-PAC OT "6 Clicks" Daily Activity     ?Outcome Measure ? ? Help from another person eating meals?: None ?Help from another person taking care of personal grooming?: None ?Help from another person toileting, which includes using toliet, bedpan, or urinal?: None ?Help from another person bathing (including washing, rinsing, drying)?: None ?Help from another person to put on and taking off regular upper body clothing?: None ?Help from another person to put on and taking off regular lower body clothing?: None ?6 Click Score: 24 ? ?  ?End of Session   ? ?OT Visit Diagnosis: Unsteadiness on feet (R26.81);Other abnormalities of gait and mobility (R26.89);Repeated falls (R29.6);Muscle weakness (generalized) (M62.81) ?  ?Activity Tolerance Patient tolerated treatment well ?  ?Patient Left  (in bathroom/on toilet) ?  ?Nurse Communication Other (comment) (patient on toilet) ?  ? ?   ? ?Time: 0856-9437 ?OT Time Calculation (min): 11 min ? ?Charges: OT General  Charges ?$OT Visit: 1 Visit ?OT Treatments ?$Self Care/Home Management : 8-22 mins ? ?Delbert Phenix OT ?OT pager: [phone] ? ? ?Rosemary Holms ?10/31/2021, 11:40 AM ?

## 2021-10-31 NOTE — Assessment & Plan Note (Addendum)
Likely contributing to current presentation/fevers. Supportive care. ?

## 2021-11-04 DIAGNOSIS — B340 Adenovirus infection, unspecified: Secondary | ICD-10-CM | POA: Diagnosis not present

## 2021-11-04 DIAGNOSIS — J189 Pneumonia, unspecified organism: Secondary | ICD-10-CM | POA: Diagnosis not present

## 2021-11-04 DIAGNOSIS — N481 Balanitis: Secondary | ICD-10-CM | POA: Diagnosis not present

## 2021-11-04 DIAGNOSIS — E871 Hypo-osmolality and hyponatremia: Secondary | ICD-10-CM | POA: Diagnosis not present

## 2021-11-04 DIAGNOSIS — E1169 Type 2 diabetes mellitus with other specified complication: Secondary | ICD-10-CM | POA: Diagnosis not present

## 2021-11-04 DIAGNOSIS — R2689 Other abnormalities of gait and mobility: Secondary | ICD-10-CM | POA: Diagnosis not present

## 2021-11-18 DIAGNOSIS — R7401 Elevation of levels of liver transaminase levels: Secondary | ICD-10-CM | POA: Diagnosis not present

## 2021-12-04 ENCOUNTER — Ambulatory Visit
Admission: RE | Admit: 2021-12-04 | Discharge: 2021-12-04 | Disposition: A | Discharge status: Home / Self Care | Payer: Medicare HMO | Source: Ambulatory Visit | Attending: Family Medicine | Admitting: Family Medicine

## 2021-12-04 ENCOUNTER — Other Ambulatory Visit: Payer: Self-pay | Admitting: Family Medicine

## 2021-12-04 DIAGNOSIS — Z8701 Personal history of pneumonia (recurrent): Secondary | ICD-10-CM | POA: Diagnosis not present

## 2021-12-04 DIAGNOSIS — J189 Pneumonia, unspecified organism: Secondary | ICD-10-CM

## 2021-12-04 DIAGNOSIS — J984 Other disorders of lung: Secondary | ICD-10-CM | POA: Diagnosis not present

## 2021-12-04 DIAGNOSIS — R918 Other nonspecific abnormal finding of lung field: Secondary | ICD-10-CM | POA: Diagnosis not present

## 2021-12-23 DIAGNOSIS — N481 Balanitis: Secondary | ICD-10-CM | POA: Diagnosis not present

## 2021-12-23 DIAGNOSIS — E1169 Type 2 diabetes mellitus with other specified complication: Secondary | ICD-10-CM | POA: Diagnosis not present

## 2022-01-07 ENCOUNTER — Other Ambulatory Visit: Payer: Self-pay | Admitting: Family Medicine

## 2022-01-07 ENCOUNTER — Ambulatory Visit
Admission: RE | Admit: 2022-01-07 | Discharge: 2022-01-07 | Disposition: A | Discharge status: Home / Self Care | Payer: Medicare HMO | Source: Ambulatory Visit | Attending: Family Medicine | Admitting: Family Medicine

## 2022-01-07 DIAGNOSIS — R918 Other nonspecific abnormal finding of lung field: Secondary | ICD-10-CM | POA: Diagnosis not present

## 2022-01-07 DIAGNOSIS — Z8701 Personal history of pneumonia (recurrent): Secondary | ICD-10-CM | POA: Diagnosis not present

## 2022-01-07 DIAGNOSIS — J189 Pneumonia, unspecified organism: Secondary | ICD-10-CM

## 2022-03-16 DIAGNOSIS — R69 Illness, unspecified: Secondary | ICD-10-CM | POA: Diagnosis not present

## 2022-03-16 DIAGNOSIS — E1169 Type 2 diabetes mellitus with other specified complication: Secondary | ICD-10-CM | POA: Diagnosis not present

## 2022-03-16 DIAGNOSIS — I10 Essential (primary) hypertension: Secondary | ICD-10-CM | POA: Diagnosis not present

## 2022-03-16 DIAGNOSIS — J449 Chronic obstructive pulmonary disease, unspecified: Secondary | ICD-10-CM | POA: Diagnosis not present

## 2022-03-16 DIAGNOSIS — K219 Gastro-esophageal reflux disease without esophagitis: Secondary | ICD-10-CM | POA: Diagnosis not present

## 2022-03-16 DIAGNOSIS — E785 Hyperlipidemia, unspecified: Secondary | ICD-10-CM | POA: Diagnosis not present

## 2022-04-07 DIAGNOSIS — J449 Chronic obstructive pulmonary disease, unspecified: Secondary | ICD-10-CM | POA: Diagnosis not present

## 2022-04-07 DIAGNOSIS — E1169 Type 2 diabetes mellitus with other specified complication: Secondary | ICD-10-CM | POA: Diagnosis not present

## 2022-04-07 DIAGNOSIS — I10 Essential (primary) hypertension: Secondary | ICD-10-CM | POA: Diagnosis not present

## 2022-04-07 DIAGNOSIS — E785 Hyperlipidemia, unspecified: Secondary | ICD-10-CM | POA: Diagnosis not present

## 2022-04-07 HISTORY — DX: Chronic obstructive pulmonary disease, unspecified: J44.9

## 2022-06-04 DIAGNOSIS — D3132 Benign neoplasm of left choroid: Secondary | ICD-10-CM | POA: Diagnosis not present

## 2022-06-04 DIAGNOSIS — Z0100 Encounter for examination of eyes and vision without abnormal findings: Secondary | ICD-10-CM | POA: Diagnosis not present

## 2022-06-04 DIAGNOSIS — Z961 Presence of intraocular lens: Secondary | ICD-10-CM | POA: Diagnosis not present

## 2022-06-04 DIAGNOSIS — E119 Type 2 diabetes mellitus without complications: Secondary | ICD-10-CM | POA: Diagnosis not present

## 2022-06-04 DIAGNOSIS — Z7984 Long term (current) use of oral hypoglycemic drugs: Secondary | ICD-10-CM | POA: Diagnosis not present

## 2022-06-04 DIAGNOSIS — H35372 Puckering of macula, left eye: Secondary | ICD-10-CM | POA: Diagnosis not present

## 2022-06-04 DIAGNOSIS — H43813 Vitreous degeneration, bilateral: Secondary | ICD-10-CM | POA: Diagnosis not present

## 2022-06-04 DIAGNOSIS — H524 Presbyopia: Secondary | ICD-10-CM | POA: Diagnosis not present

## 2022-06-04 DIAGNOSIS — H5203 Hypermetropia, bilateral: Secondary | ICD-10-CM | POA: Diagnosis not present

## 2022-06-04 DIAGNOSIS — H52223 Regular astigmatism, bilateral: Secondary | ICD-10-CM | POA: Diagnosis not present

## 2022-07-08 DIAGNOSIS — I739 Peripheral vascular disease, unspecified: Secondary | ICD-10-CM | POA: Diagnosis not present

## 2022-07-08 DIAGNOSIS — R69 Illness, unspecified: Secondary | ICD-10-CM | POA: Diagnosis not present

## 2022-07-08 DIAGNOSIS — Z23 Encounter for immunization: Secondary | ICD-10-CM | POA: Diagnosis not present

## 2022-07-08 DIAGNOSIS — R413 Other amnesia: Secondary | ICD-10-CM | POA: Diagnosis not present

## 2022-09-30 DIAGNOSIS — E785 Hyperlipidemia, unspecified: Secondary | ICD-10-CM | POA: Diagnosis not present

## 2022-09-30 DIAGNOSIS — J301 Allergic rhinitis due to pollen: Secondary | ICD-10-CM | POA: Diagnosis not present

## 2022-09-30 DIAGNOSIS — L989 Disorder of the skin and subcutaneous tissue, unspecified: Secondary | ICD-10-CM | POA: Diagnosis not present

## 2022-09-30 DIAGNOSIS — I739 Peripheral vascular disease, unspecified: Secondary | ICD-10-CM | POA: Diagnosis not present

## 2022-09-30 DIAGNOSIS — N401 Enlarged prostate with lower urinary tract symptoms: Secondary | ICD-10-CM | POA: Diagnosis not present

## 2022-09-30 DIAGNOSIS — I10 Essential (primary) hypertension: Secondary | ICD-10-CM | POA: Diagnosis not present

## 2022-09-30 DIAGNOSIS — J449 Chronic obstructive pulmonary disease, unspecified: Secondary | ICD-10-CM | POA: Diagnosis not present

## 2022-09-30 DIAGNOSIS — N529 Male erectile dysfunction, unspecified: Secondary | ICD-10-CM | POA: Diagnosis not present

## 2022-09-30 DIAGNOSIS — F419 Anxiety disorder, unspecified: Secondary | ICD-10-CM | POA: Diagnosis not present

## 2022-09-30 DIAGNOSIS — F324 Major depressive disorder, single episode, in partial remission: Secondary | ICD-10-CM | POA: Diagnosis not present

## 2022-09-30 DIAGNOSIS — E1151 Type 2 diabetes mellitus with diabetic peripheral angiopathy without gangrene: Secondary | ICD-10-CM | POA: Diagnosis not present

## 2022-09-30 DIAGNOSIS — E1169 Type 2 diabetes mellitus with other specified complication: Secondary | ICD-10-CM | POA: Diagnosis not present

## 2022-09-30 DIAGNOSIS — E559 Vitamin D deficiency, unspecified: Secondary | ICD-10-CM | POA: Diagnosis not present

## 2022-09-30 DIAGNOSIS — Z0000 Encounter for general adult medical examination without abnormal findings: Secondary | ICD-10-CM | POA: Diagnosis not present

## 2022-09-30 DIAGNOSIS — K219 Gastro-esophageal reflux disease without esophagitis: Secondary | ICD-10-CM | POA: Diagnosis not present

## 2022-09-30 DIAGNOSIS — Z79899 Other long term (current) drug therapy: Secondary | ICD-10-CM | POA: Diagnosis not present

## 2022-09-30 DIAGNOSIS — R69 Illness, unspecified: Secondary | ICD-10-CM | POA: Diagnosis not present

## 2022-10-13 DIAGNOSIS — I781 Nevus, non-neoplastic: Secondary | ICD-10-CM | POA: Diagnosis not present

## 2022-10-13 DIAGNOSIS — D225 Melanocytic nevi of trunk: Secondary | ICD-10-CM | POA: Diagnosis not present

## 2022-10-13 DIAGNOSIS — L918 Other hypertrophic disorders of the skin: Secondary | ICD-10-CM | POA: Diagnosis not present

## 2022-10-26 DIAGNOSIS — J449 Chronic obstructive pulmonary disease, unspecified: Secondary | ICD-10-CM | POA: Diagnosis not present

## 2022-10-26 DIAGNOSIS — I10 Essential (primary) hypertension: Secondary | ICD-10-CM | POA: Diagnosis not present

## 2022-10-26 DIAGNOSIS — E785 Hyperlipidemia, unspecified: Secondary | ICD-10-CM | POA: Diagnosis not present

## 2022-10-26 DIAGNOSIS — E1169 Type 2 diabetes mellitus with other specified complication: Secondary | ICD-10-CM | POA: Diagnosis not present

## 2023-02-10 ENCOUNTER — Ambulatory Visit: Payer: Medicare HMO | Admitting: Podiatry

## 2023-02-10 ENCOUNTER — Ambulatory Visit: Payer: Medicare HMO

## 2023-02-10 DIAGNOSIS — E119 Type 2 diabetes mellitus without complications: Secondary | ICD-10-CM | POA: Diagnosis not present

## 2023-02-10 DIAGNOSIS — M21621 Bunionette of right foot: Secondary | ICD-10-CM

## 2023-02-10 DIAGNOSIS — M21622 Bunionette of left foot: Secondary | ICD-10-CM | POA: Diagnosis not present

## 2023-02-10 DIAGNOSIS — M7741 Metatarsalgia, right foot: Secondary | ICD-10-CM | POA: Diagnosis not present

## 2023-02-10 DIAGNOSIS — M7742 Metatarsalgia, left foot: Secondary | ICD-10-CM | POA: Diagnosis not present

## 2023-02-10 DIAGNOSIS — L600 Ingrowing nail: Secondary | ICD-10-CM | POA: Diagnosis not present

## 2023-02-10 DIAGNOSIS — M79671 Pain in right foot: Secondary | ICD-10-CM

## 2023-02-10 DIAGNOSIS — E0843 Diabetes mellitus due to underlying condition with diabetic autonomic (poly)neuropathy: Secondary | ICD-10-CM

## 2023-02-10 NOTE — Patient Instructions (Signed)

## 2023-02-10 NOTE — Progress Notes (Signed)
Ingrown LT med Appt for dm shoes Tailors b/l. Metatarsalgia both    Chief Complaint  Patient presents with   Nail Problem    Patient came in today for left foot hallux nail pain, nail is thick and yellow, patient also has bilateral callus     Subjective: Patient PMHx diabetes mellitus presents today for evaluation of pain to the medial border of the left great toe. Patient is concerned for possible ingrown nail.  It is very sensitive to touch.  Patient also states that he has chronic pain and tenderness associated to the bilateral forefoot.  He would like to discuss possible diabetic shoes with diabetic insoles.  Patient presents today for further treatment and evaluation.  Past Medical History:  Diagnosis Date   GERD (gastroesophageal reflux disease)    Hyperlipidemia    Hypertension     Objective:  General: Well developed, nourished, in no acute distress, alert and oriented x3   Dermatology: Skin is warm, dry and supple bilateral.  Medial border left great toe is tender with evidence of an ingrowing nail. Pain on palpation noted to the border of the nail fold. The remaining nails appear unremarkable at this time. There are no open sores, lesions.  Vascular: DP and PT pulses palpable.  No clinical evidence of vascular compromise  Neruologic: Light touch and protective threshold diminished  Musculoskeletal: No pedal deformity noted. Pain on palpation and ROM to the lesser MTP of the bilateral feet. Tailor's bunionette noted bilateral.   Assesement: #1 Paronychia with ingrowing nail medial border left great toe #2 encounter for diabetic foot exam #3 metatarsalgia bilateral #4  Tailor's bunionette bilateral  #5 diabetes mellitus with peripheral polyneuropathy  Plan of Care:  -Patient evaluated.  Comprehensive diabetic foot exam performed today -Discussed treatment alternatives and plan of care. Explained nail avulsion procedure and post procedure course to patient. -Patient  opted for permanent partial nail avulsion of the ingrown portion of the nail.  -Prior to procedure, local anesthesia infiltration utilized using 3 ml of a 50:50 mixture of 2% plain lidocaine and 0.5% plain marcaine in a normal hallux block fashion and a betadine prep performed.  -Partial permanent nail avulsion with chemical matrixectomy performed using 3x30sec applications of phenol followed by alcohol flush.  -Light dressing applied.  Post care instructions provided -With diabetic shoe department for custom molded diabetic insoles and shoes -Return to clinic 3 weeks  Felecia Shelling, DPM [facility]  Dr. Felecia Shelling, DPM    [address] [place], [place] [address]                Office [phone]  Fax [phone]

## 2023-02-25 IMAGING — CR DG CHEST 2V
2 series · 2 of 2 positions shown · non-contrast
Comparison: Single-view of the chest 10/29/2021 and PA and lateral
chest 06/23/2013.

CLINICAL DATA: History of pneumonia on the right. Evaluate for
clearing.

EXAM:
CHEST - 2 VIEW

[w chest pa]
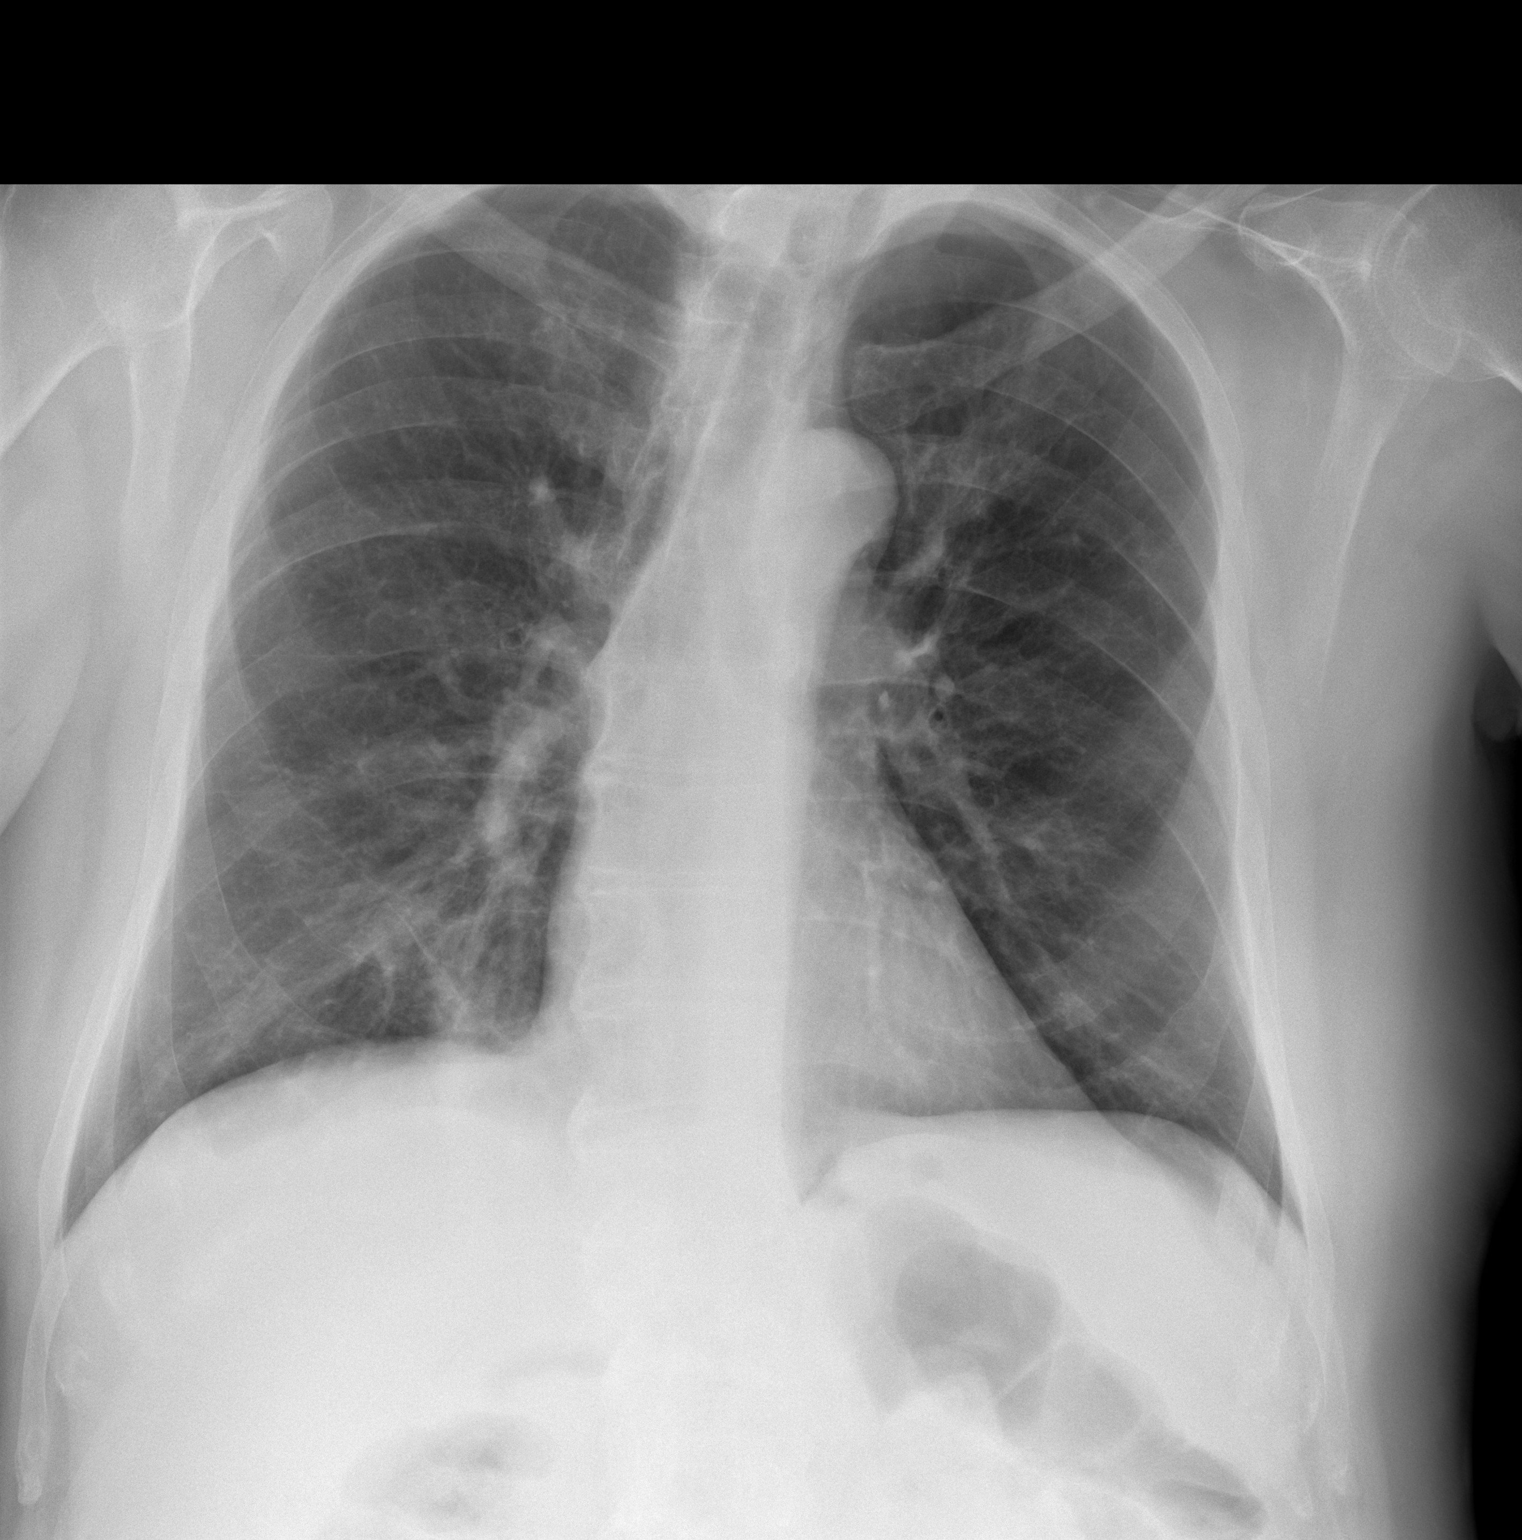

[w chest lat]
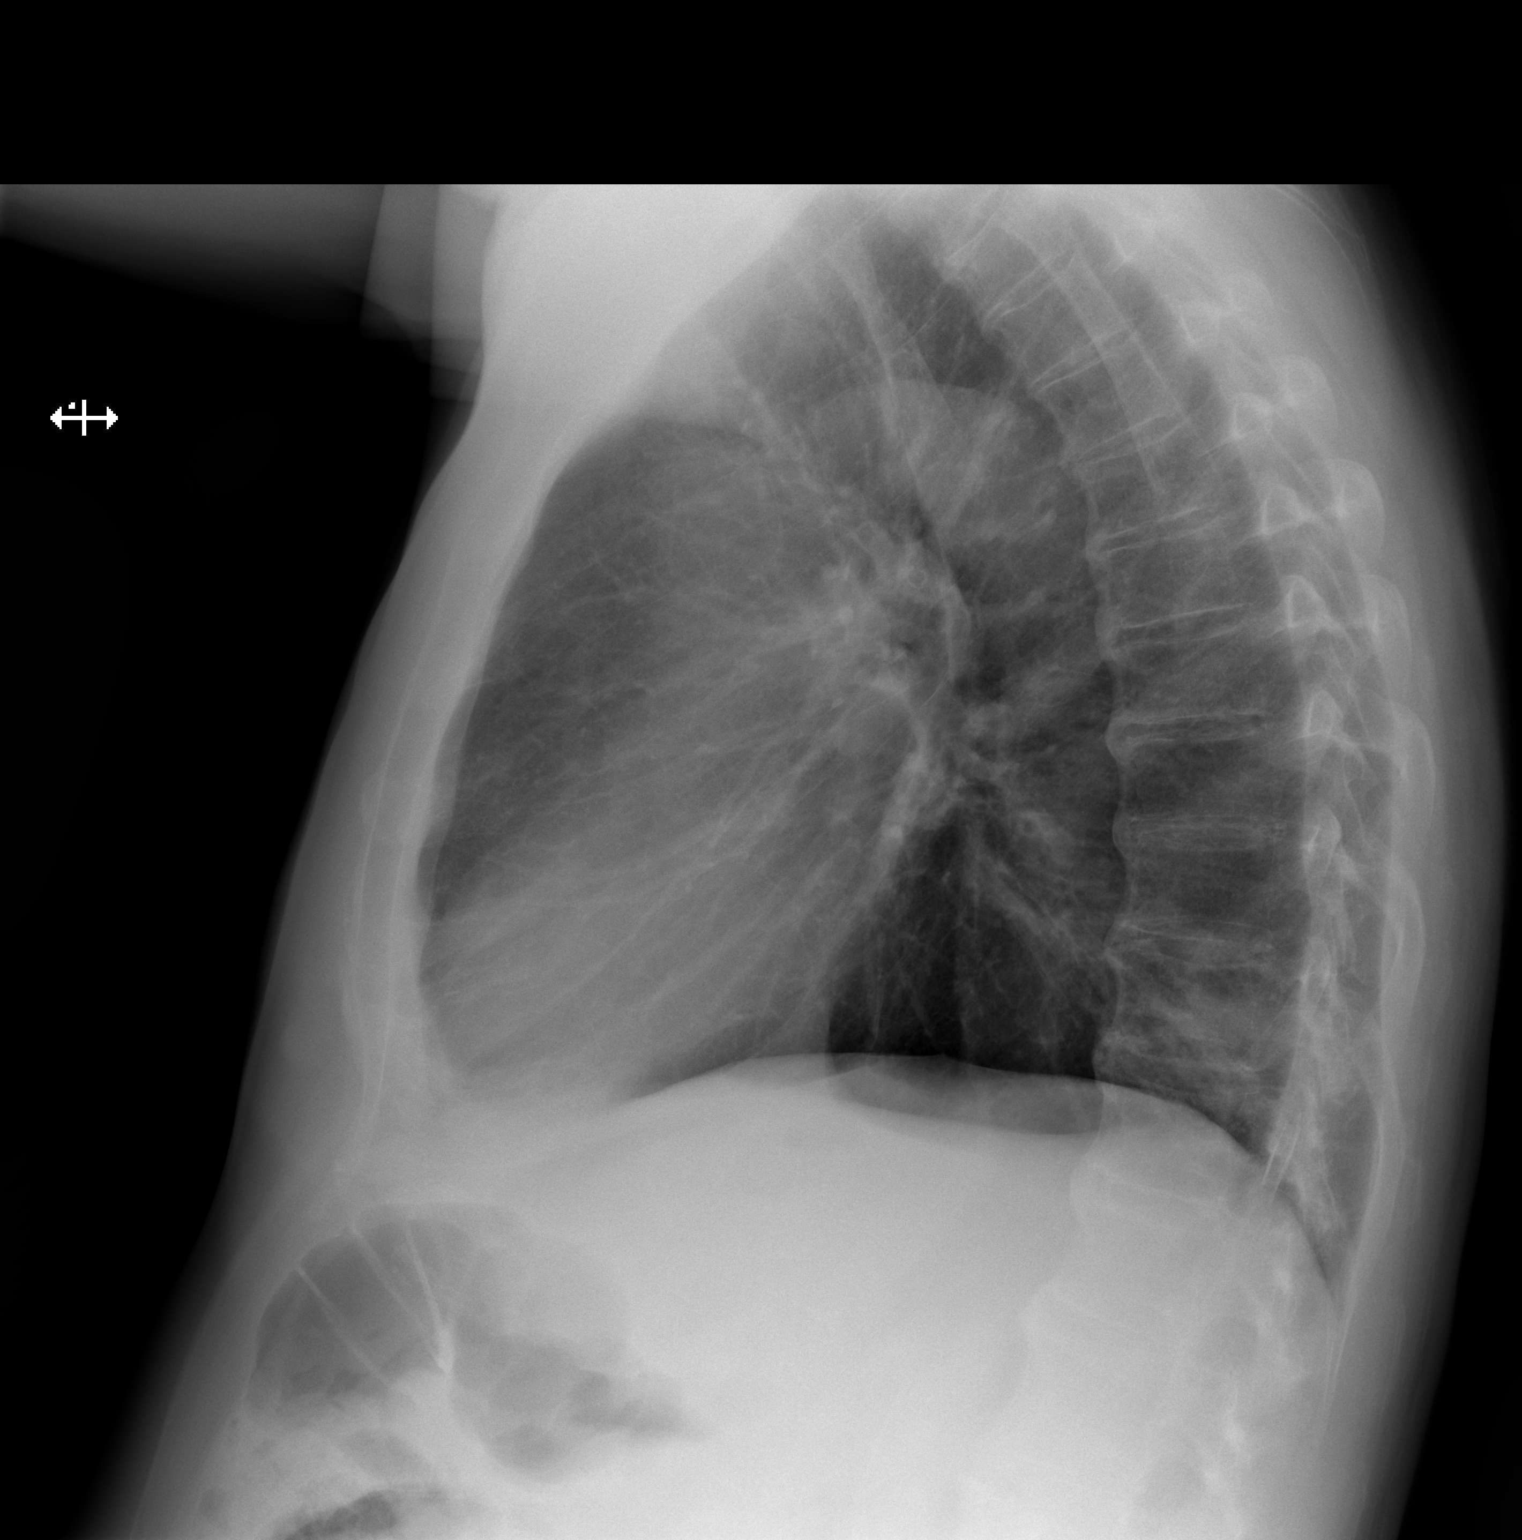

[2 of 2 positions shown; findings below may reference images not displayed]

FINDINGS: Aeration is markedly improved with a small focus of residual
airspace opacity present in the right lower lobe. The left lung is
clear. Heart size is normal. No pneumothorax or pleural fluid. No
focal bony abnormality.
IMPRESSION: Marked improvement in airspace disease in the right chest with only
a small residual opacity seen in the right lower lobe.

## 2023-03-30 ENCOUNTER — Telehealth: Payer: Self-pay | Admitting: Podiatry

## 2023-03-30 ENCOUNTER — Ambulatory Visit: Payer: Medicare HMO

## 2023-03-30 NOTE — Progress Notes (Signed)
Patient was here to talk about DM shoes and inserts patient is concerned that INS may reject. He will be seeing his treating Dr. This week and will discuss w/ her about getting shoes and the required ppw that is needed for medicare  I did take impressions and will hold until he calls back.  [name] Cped, CFo, CFm

## 2023-03-30 NOTE — Telephone Encounter (Signed)
Patient called to say go ahead and submit paper work to his doctor for the diabetic shoes , she said she will agree to sign it.

## 2023-03-31 IMAGING — CR DG CHEST 2V
2 series · 2 of 2 positions shown · non-contrast
Comparison: Chest radiograph 12/04/2021.

CLINICAL DATA: History of pneumonia.

EXAM:
CHEST - 2 VIEW

[w chest pa]
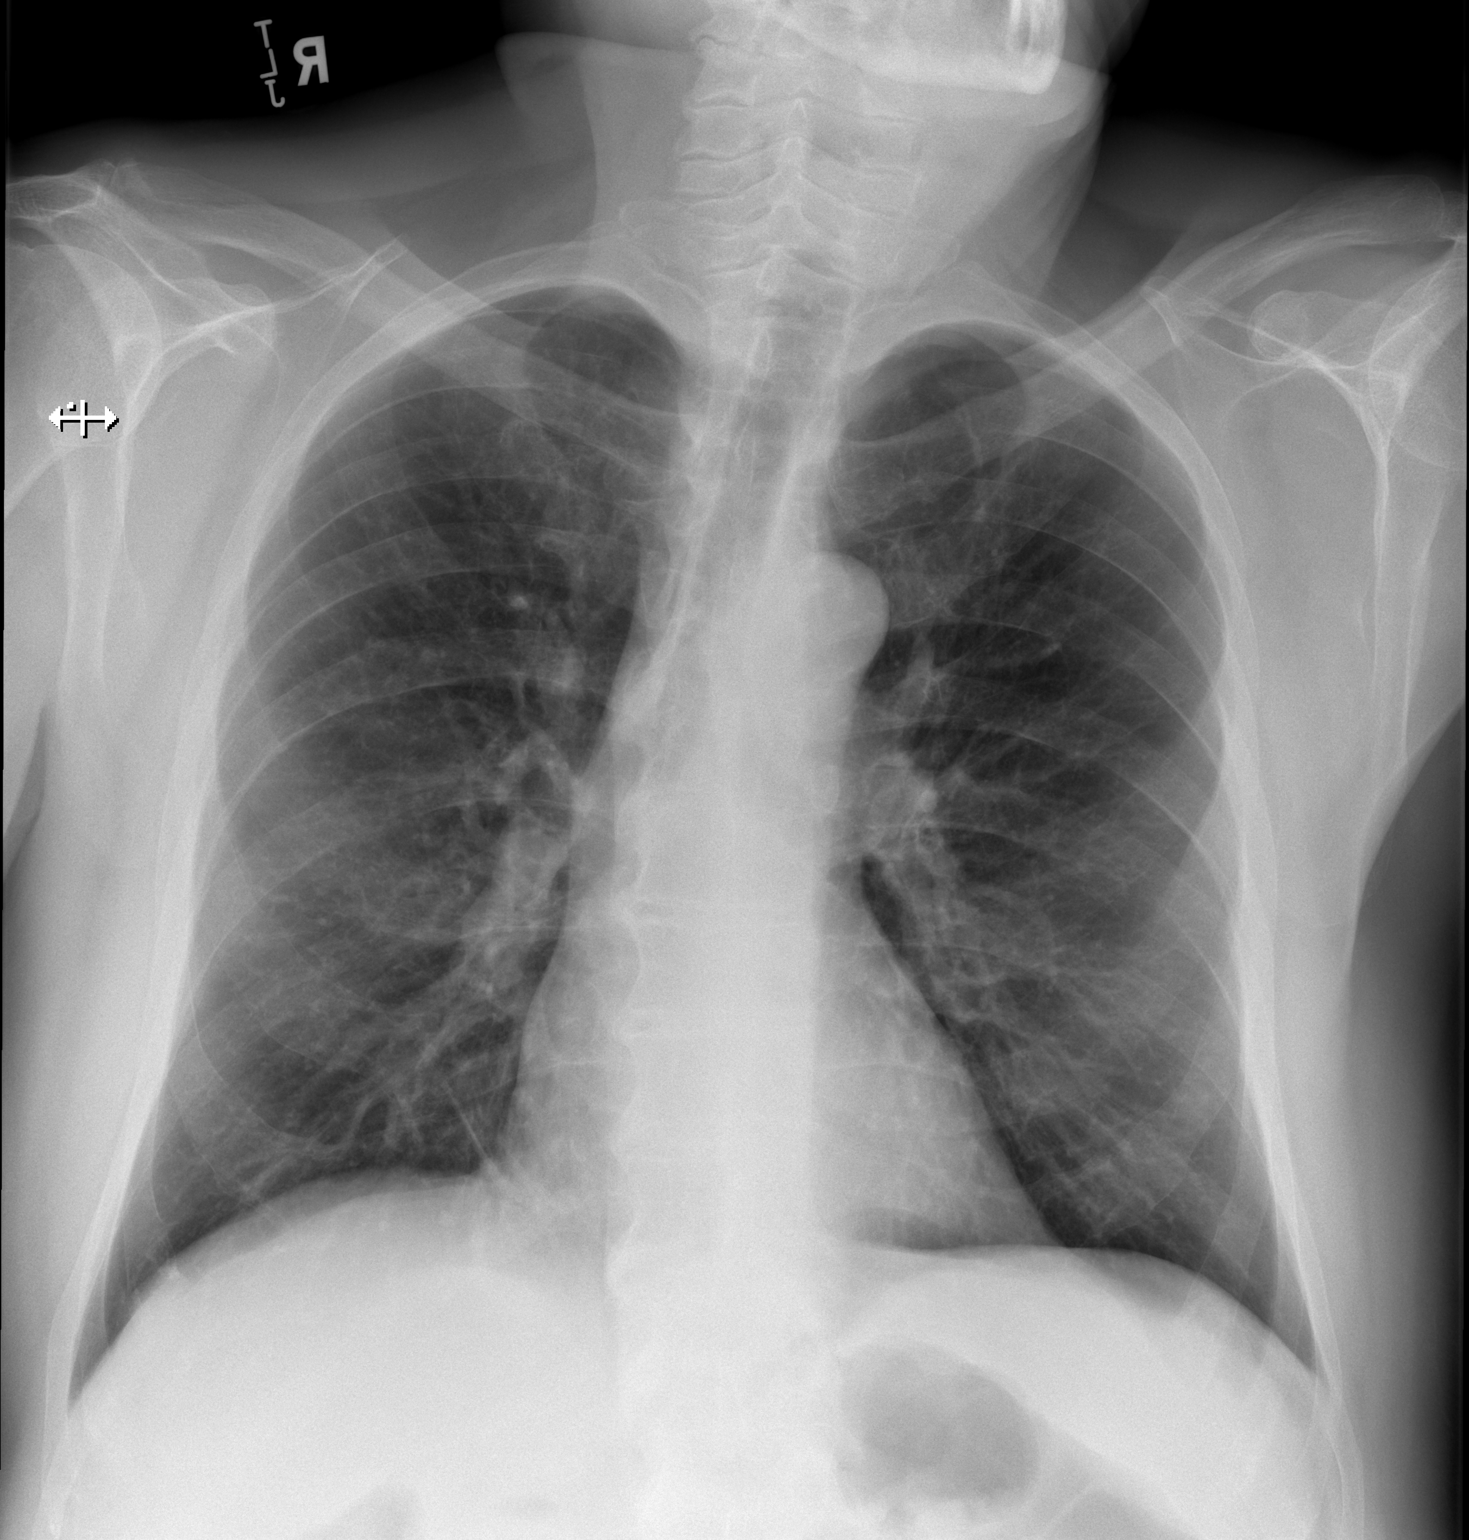

[w chest lat]
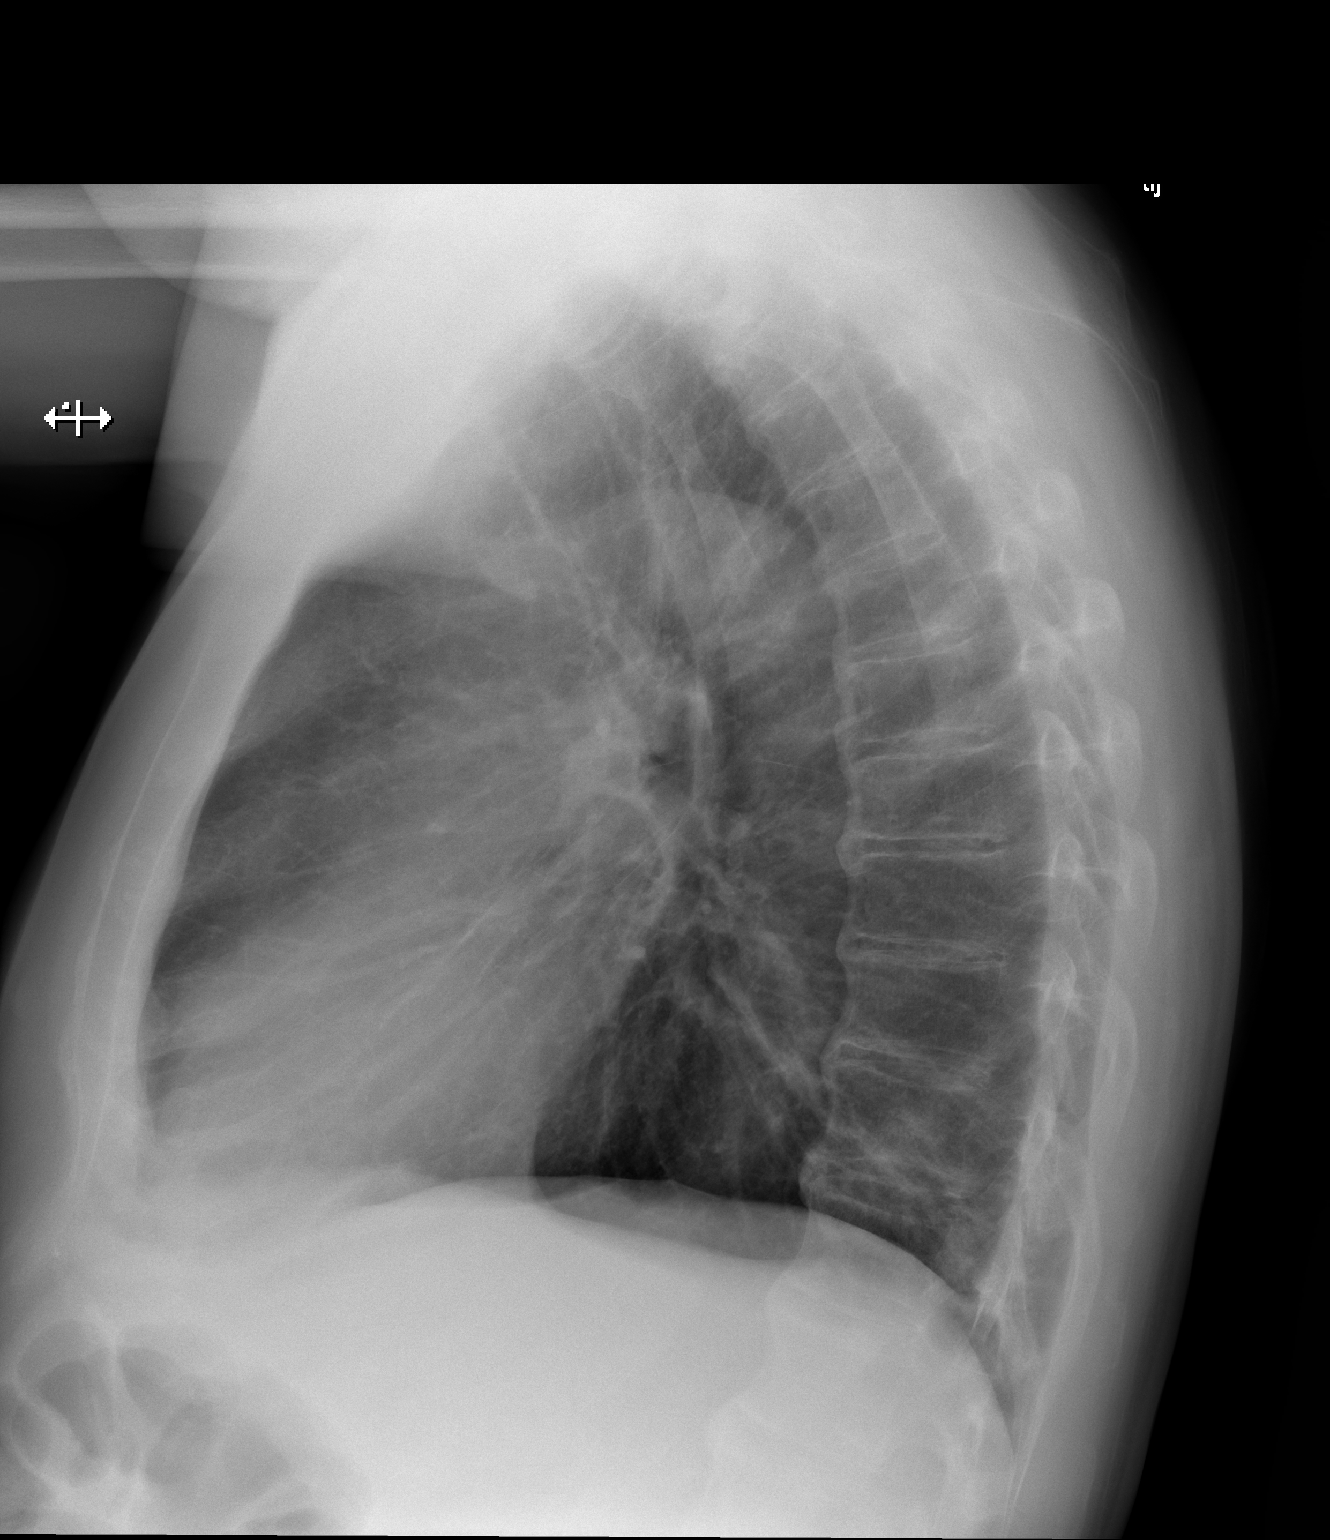

[2 of 2 positions shown; findings below may reference images not displayed]

FINDINGS: The heart size and mediastinal contours are within normal limits.
Both lungs are clear. The visualized skeletal structures are
unremarkable.
IMPRESSION: Interval resolution previously described opacity right lung. No
acute process.

## 2023-04-01 DIAGNOSIS — E785 Hyperlipidemia, unspecified: Secondary | ICD-10-CM | POA: Diagnosis not present

## 2023-04-01 DIAGNOSIS — G4733 Obstructive sleep apnea (adult) (pediatric): Secondary | ICD-10-CM | POA: Diagnosis not present

## 2023-04-01 DIAGNOSIS — F324 Major depressive disorder, single episode, in partial remission: Secondary | ICD-10-CM | POA: Diagnosis not present

## 2023-04-01 DIAGNOSIS — I700 Atherosclerosis of aorta: Secondary | ICD-10-CM | POA: Diagnosis not present

## 2023-04-01 DIAGNOSIS — E1169 Type 2 diabetes mellitus with other specified complication: Secondary | ICD-10-CM | POA: Diagnosis not present

## 2023-04-01 DIAGNOSIS — E119 Type 2 diabetes mellitus without complications: Secondary | ICD-10-CM | POA: Diagnosis not present

## 2023-04-01 DIAGNOSIS — D692 Other nonthrombocytopenic purpura: Secondary | ICD-10-CM | POA: Diagnosis not present

## 2023-04-01 DIAGNOSIS — F419 Anxiety disorder, unspecified: Secondary | ICD-10-CM | POA: Diagnosis not present

## 2023-04-01 DIAGNOSIS — I10 Essential (primary) hypertension: Secondary | ICD-10-CM | POA: Diagnosis not present

## 2023-04-07 NOTE — Telephone Encounter (Signed)
Patient called again wanting to know the status of his diabetic shoe order .

## 2023-04-08 NOTE — Progress Notes (Signed)
   Patient was measured on 8/13 called and wants to move forward with order patient was measured with brannock device to determine size and width for 1 pair of extra depth shoes and foam casted for 3 pair of insoles.   Documentation of medical necessity will be sent to patient's treating diabetic doctor to verify and sign.   Patient's diabetic provider: Laurann Montana MD / Dr Logan Bores DPM   Shoes and insoles will be ordered at that time and patient will be notified for an appointment for fitting when they arrive.   Shoe size (per patient): X801M Brannock measurement: 12 Patient shoe selection- Shoe choice:   X801M  2nd [id number]  Shoe size ordered: 12.5 W  Financials Signed  Laree Garron Cped CFo, CFm

## 2023-05-03 ENCOUNTER — Ambulatory Visit (INDEPENDENT_AMBULATORY_CARE_PROVIDER_SITE_OTHER): Payer: Medicare HMO

## 2023-05-03 DIAGNOSIS — M21621 Bunionette of right foot: Secondary | ICD-10-CM | POA: Diagnosis not present

## 2023-05-03 DIAGNOSIS — M7742 Metatarsalgia, left foot: Secondary | ICD-10-CM

## 2023-05-03 DIAGNOSIS — E0843 Diabetes mellitus due to underlying condition with diabetic autonomic (poly)neuropathy: Secondary | ICD-10-CM | POA: Diagnosis not present

## 2023-05-03 DIAGNOSIS — L600 Ingrowing nail: Secondary | ICD-10-CM

## 2023-05-03 DIAGNOSIS — M7741 Metatarsalgia, right foot: Secondary | ICD-10-CM | POA: Diagnosis not present

## 2023-05-03 DIAGNOSIS — M21622 Bunionette of left foot: Secondary | ICD-10-CM | POA: Diagnosis not present

## 2023-05-04 ENCOUNTER — Other Ambulatory Visit: Payer: Medicare HMO

## 2023-05-04 NOTE — Progress Notes (Signed)

## 2023-05-06 DIAGNOSIS — Z8669 Personal history of other diseases of the nervous system and sense organs: Secondary | ICD-10-CM | POA: Diagnosis not present

## 2023-10-01 DIAGNOSIS — Z7984 Long term (current) use of oral hypoglycemic drugs: Secondary | ICD-10-CM | POA: Diagnosis not present

## 2023-10-01 DIAGNOSIS — H35372 Puckering of macula, left eye: Secondary | ICD-10-CM | POA: Diagnosis not present

## 2023-10-01 DIAGNOSIS — H43813 Vitreous degeneration, bilateral: Secondary | ICD-10-CM | POA: Diagnosis not present

## 2023-10-01 DIAGNOSIS — Z961 Presence of intraocular lens: Secondary | ICD-10-CM | POA: Diagnosis not present

## 2023-10-01 DIAGNOSIS — H52223 Regular astigmatism, bilateral: Secondary | ICD-10-CM | POA: Diagnosis not present

## 2023-10-01 DIAGNOSIS — H5203 Hypermetropia, bilateral: Secondary | ICD-10-CM | POA: Diagnosis not present

## 2023-10-01 DIAGNOSIS — D3132 Benign neoplasm of left choroid: Secondary | ICD-10-CM | POA: Diagnosis not present

## 2023-10-01 DIAGNOSIS — E119 Type 2 diabetes mellitus without complications: Secondary | ICD-10-CM | POA: Diagnosis not present

## 2023-10-01 DIAGNOSIS — H524 Presbyopia: Secondary | ICD-10-CM | POA: Diagnosis not present

## 2023-10-12 DIAGNOSIS — Z79899 Other long term (current) drug therapy: Secondary | ICD-10-CM | POA: Diagnosis not present

## 2024-01-13 DIAGNOSIS — E119 Type 2 diabetes mellitus without complications: Secondary | ICD-10-CM

## 2024-01-13 HISTORY — DX: Type 2 diabetes mellitus without complications: E11.9

## 2024-01-24 ENCOUNTER — Encounter: Payer: Self-pay | Admitting: Podiatry

## 2024-01-24 ENCOUNTER — Ambulatory Visit (INDEPENDENT_AMBULATORY_CARE_PROVIDER_SITE_OTHER): Admitting: Podiatry

## 2024-01-24 VITALS — Ht 69.0 in | Wt 226.0 lb

## 2024-01-24 DIAGNOSIS — D2372 Other benign neoplasm of skin of left lower limb, including hip: Secondary | ICD-10-CM

## 2024-01-24 NOTE — Progress Notes (Signed)
   Chief Complaint  Patient presents with   Callouses    Pt is here due to bilateral corn/ callus on his feet.    Subjective: 73 y.o. male presenting to the office today for evaluation of symptomatic skin lesion to the plantar aspect of the bilateral forefoot.  Progressive onset over the last few months.  He states that he does not go barefoot around the house.  He is trying to debride the lesion himself   Past Medical History:  Diagnosis Date   GERD (gastroesophageal reflux disease)    Hyperlipidemia    Hypertension     Past Surgical History:  Procedure Laterality Date   COLON SURGERY     COLONOSCOPY WITH PROPOFOL  N/A 12/02/2015   Procedure: COLONOSCOPY WITH PROPOFOL ;  Surgeon: Garrett Kallman, MD;  Location: [facility];  Service: Endoscopy;  Laterality: N/A;    No Known Allergies   Objective:  Physical Exam General: Alert and oriented x3 in no acute distress  Dermatology: Hyperkeratotic lesion(s) present on the lateral aspect of the bilateral forefoot. Pain on palpation with a central nucleated core noted. Skin is warm, dry and supple bilateral lower extremities. Negative for open lesions or macerations.  Vascular: Palpable pedal pulses bilaterally. No edema or erythema noted. Capillary refill within normal limits.  Neurological: Grossly intact via light touch  Musculoskeletal Exam: Pain on palpation at the keratotic lesion(s) noted. Range of motion within normal limits bilateral. Muscle strength 5/5 in all groups bilateral.  Assessment: 1.  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  -Salicylic acid applied with a bandaid -Advised against going barefoot.  Recommend good supportive tennis shoes and sneakers -Return to the clinic PRN.   Dot Gazella, DPM [facility]  Dr. Dot Gazella, DPM     [address] [place], [place] [address]                Office [phone]  Fax [phone]

## 2024-02-16 ENCOUNTER — Telehealth: Payer: Self-pay | Admitting: Podiatry

## 2024-02-16 NOTE — Telephone Encounter (Signed)
 Patient states the spot where corn was removed has been hurting since 6/9. It hurts sitting and laying down pain sometimes steady. He is concerned what should he do to relieve?

## 2024-03-13 ENCOUNTER — Ambulatory Visit: Admitting: Podiatry

## 2024-03-31 ENCOUNTER — Telehealth: Payer: Self-pay | Admitting: Podiatry

## 2024-03-31 NOTE — Telephone Encounter (Signed)
 error

## 2024-04-10 ENCOUNTER — Ambulatory Visit: Admitting: Podiatry

## 2024-04-10 ENCOUNTER — Encounter: Payer: Self-pay | Admitting: Podiatry

## 2024-04-10 DIAGNOSIS — J301 Allergic rhinitis due to pollen: Secondary | ICD-10-CM | POA: Insufficient documentation

## 2024-04-10 DIAGNOSIS — N401 Enlarged prostate with lower urinary tract symptoms: Secondary | ICD-10-CM | POA: Insufficient documentation

## 2024-04-10 DIAGNOSIS — D2372 Other benign neoplasm of skin of left lower limb, including hip: Secondary | ICD-10-CM | POA: Diagnosis not present

## 2024-04-10 DIAGNOSIS — G4733 Obstructive sleep apnea (adult) (pediatric): Secondary | ICD-10-CM | POA: Insufficient documentation

## 2024-04-10 DIAGNOSIS — I700 Atherosclerosis of aorta: Secondary | ICD-10-CM | POA: Insufficient documentation

## 2024-04-10 DIAGNOSIS — F419 Anxiety disorder, unspecified: Secondary | ICD-10-CM | POA: Insufficient documentation

## 2024-04-10 DIAGNOSIS — N529 Male erectile dysfunction, unspecified: Secondary | ICD-10-CM | POA: Insufficient documentation

## 2024-04-10 DIAGNOSIS — E559 Vitamin D deficiency, unspecified: Secondary | ICD-10-CM | POA: Insufficient documentation

## 2024-04-10 DIAGNOSIS — F324 Major depressive disorder, single episode, in partial remission: Secondary | ICD-10-CM | POA: Insufficient documentation

## 2024-04-10 DIAGNOSIS — E785 Hyperlipidemia, unspecified: Secondary | ICD-10-CM | POA: Insufficient documentation

## 2024-04-10 NOTE — Progress Notes (Signed)
   Chief Complaint  Patient presents with   Callouses    Trim plantar forefoot callus left     Subjective: 73 y.o. male presenting to the office today for evaluation of a recurrent symptomatic lesion to the plantar aspect of the left forefoot.   Past Medical History:  Diagnosis Date   GERD (gastroesophageal reflux disease)    Hyperlipidemia    Hypertension     Past Surgical History:  Procedure Laterality Date   COLON SURGERY     COLONOSCOPY WITH PROPOFOL  N/A 12/02/2015   Procedure: COLONOSCOPY WITH PROPOFOL ;  Surgeon: Gladis MARLA Louder, MD;  Location: [facility];  Service: Endoscopy;  Laterality: N/A;    Allergies  Allergen Reactions   Lisinopril     Other Reaction(s): cough     Objective:  Physical Exam General: Alert and oriented x3 in no acute distress  Dermatology: Hyperkeratotic lesion(s) present on the plantar aspect of the left forefoot. Pain on palpation with a central nucleated core noted. Skin is warm, dry and supple bilateral lower extremities. Negative for open lesions or macerations.  Vascular: Palpable pedal pulses bilaterally. No edema or erythema noted. Capillary refill within normal limits.  Neurological: Grossly intact via light touch  Musculoskeletal Exam: Pain on palpation at the keratotic lesion(s) noted. Range of motion within normal limits bilateral. Muscle strength 5/5 in all groups bilateral.  Assessment: 1.  Eccrine poroma plantar aspect of the left foot   Plan of Care:  -Patient evaluated -Excisional debridement of keratoic lesion(s) using a chisel blade was performed without incident.  -Salicylic acid applied with a bandaid -Recommend OTC salicylic acid daily -Return to the clinic PRN.   Thresa EMERSON Sar, DPM [facility]  Dr. Thresa EMERSON Sar, DPM    [address] [place], [place] [address]                Office [phone]  Fax [phone]

## 2024-04-13 DIAGNOSIS — F419 Anxiety disorder, unspecified: Secondary | ICD-10-CM

## 2024-04-13 HISTORY — DX: Anxiety disorder, unspecified: F41.9

## 2024-04-20 DIAGNOSIS — R591 Generalized enlarged lymph nodes: Secondary | ICD-10-CM | POA: Diagnosis not present

## 2024-04-20 DIAGNOSIS — R051 Acute cough: Secondary | ICD-10-CM | POA: Diagnosis not present

## 2024-04-20 DIAGNOSIS — Z20822 Contact with and (suspected) exposure to covid-19: Secondary | ICD-10-CM | POA: Diagnosis not present

## 2024-04-20 DIAGNOSIS — R5383 Other fatigue: Secondary | ICD-10-CM | POA: Diagnosis not present

## 2024-04-24 ENCOUNTER — Telehealth: Payer: Self-pay | Admitting: Podiatry

## 2024-04-24 NOTE — Telephone Encounter (Signed)
 Pt called and would like a referral for DM shoes

## 2024-04-28 DIAGNOSIS — F32A Depression, unspecified: Secondary | ICD-10-CM

## 2024-04-28 HISTORY — DX: Depression, unspecified: F32.A

## 2024-05-03 ENCOUNTER — Other Ambulatory Visit: Payer: Self-pay | Admitting: Family Medicine

## 2024-05-03 DIAGNOSIS — E1169 Type 2 diabetes mellitus with other specified complication: Secondary | ICD-10-CM | POA: Diagnosis not present

## 2024-05-03 DIAGNOSIS — R591 Generalized enlarged lymph nodes: Secondary | ICD-10-CM

## 2024-05-04 ENCOUNTER — Encounter: Payer: Self-pay | Admitting: Family Medicine

## 2024-05-08 ENCOUNTER — Ambulatory Visit
Admission: RE | Admit: 2024-05-08 | Discharge: 2024-05-08 | Disposition: A | Discharge status: Home / Self Care | Source: Ambulatory Visit | Attending: Family Medicine | Admitting: Family Medicine

## 2024-05-08 DIAGNOSIS — R591 Generalized enlarged lymph nodes: Secondary | ICD-10-CM

## 2024-05-08 DIAGNOSIS — R599 Enlarged lymph nodes, unspecified: Secondary | ICD-10-CM | POA: Diagnosis not present

## 2024-05-08 MED ORDER — IOPAMIDOL (ISOVUE-370) INJECTION 76%
75.0000 mL | Freq: Once | INTRAVENOUS | Status: AC | PRN
  Administered 2024-05-08: 75 mL via INTRAVENOUS

## 2024-05-12 ENCOUNTER — Other Ambulatory Visit (HOSPITAL_COMMUNITY): Payer: Self-pay | Admitting: Family Medicine

## 2024-05-12 ENCOUNTER — Encounter: Payer: Self-pay | Admitting: Radiology

## 2024-05-12 DIAGNOSIS — R591 Generalized enlarged lymph nodes: Secondary | ICD-10-CM

## 2024-05-12 DIAGNOSIS — R590 Localized enlarged lymph nodes: Secondary | ICD-10-CM

## 2024-05-12 NOTE — Progress Notes (Signed)
 Karalee Wilkie POUR, MD  Daralene Ferol FALCON, RT Approved for US  guided core biopsy of left neck mass (probable LN).  No sedation.  HKM       Previous Messages    ----- Message ----- From: Daralene Ferol FALCON, RT Sent: 05/12/2024  11:25 AM EDT To: Ir Procedure Requests Subject: US  CORE BIOPSY (LYMPH NODES)                  Procedure : US  CORE BIOPSY (LYMPH NODES)  Reason :suspicious cerbical lymph node seen on CT Dx: Lymphadenopathy [R59.1 (ICD-10-CM)]   History : CT SOFT TISSUE NECK W CONTRAST (Accession [id number]) (Order [id number]), MR BRAIN WO CONTRAST (Accession [id number]) (Order [id number]), CT Chest W Contrast (Accession [id number]) (Order [id number]), CT Head Wo Contrast (Accession [id number]) (Order [id number])   Provider:  Teresa Channel, MD  Provider contact ; [phone]

## 2024-05-17 DIAGNOSIS — C801 Malignant (primary) neoplasm, unspecified: Secondary | ICD-10-CM

## 2024-05-17 HISTORY — DX: Malignant (primary) neoplasm, unspecified: C80.1

## 2024-05-19 ENCOUNTER — Ambulatory Visit (HOSPITAL_COMMUNITY)
Admission: RE | Admit: 2024-05-19 | Discharge: 2024-05-19 | Disposition: A | Discharge status: Home / Self Care | Source: Ambulatory Visit | Attending: Family Medicine | Admitting: Family Medicine

## 2024-05-19 DIAGNOSIS — I700 Atherosclerosis of aorta: Secondary | ICD-10-CM | POA: Diagnosis not present

## 2024-05-19 DIAGNOSIS — K8020 Calculus of gallbladder without cholecystitis without obstruction: Secondary | ICD-10-CM | POA: Diagnosis not present

## 2024-05-19 DIAGNOSIS — R590 Localized enlarged lymph nodes: Secondary | ICD-10-CM | POA: Diagnosis not present

## 2024-05-19 DIAGNOSIS — I2510 Atherosclerotic heart disease of native coronary artery without angina pectoris: Secondary | ICD-10-CM | POA: Diagnosis not present

## 2024-05-19 LAB — GLUCOSE, CAPILLARY: Glucose-Capillary: 156 mg/dL — ABNORMAL HIGH (ref 70–99)

## 2024-05-19 MED ORDER — FLUDEOXYGLUCOSE F - 18 (FDG) INJECTION
11.2000 | Freq: Once | INTRAVENOUS | Status: AC | PRN
Start: 2024-05-19 — End: 2024-05-19
  Administered 2024-05-19: 11 via INTRAVENOUS

## 2024-05-22 ENCOUNTER — Other Ambulatory Visit (HOSPITAL_COMMUNITY): Payer: Self-pay | Admitting: Family Medicine

## 2024-05-22 ENCOUNTER — Encounter: Payer: Self-pay | Admitting: Medical Oncology

## 2024-05-22 ENCOUNTER — Inpatient Hospital Stay: Attending: Physician Assistant | Admitting: Physician Assistant

## 2024-05-22 ENCOUNTER — Inpatient Hospital Stay

## 2024-05-22 VITALS — BP 114/79 | HR 86 | Temp 97.9°F | Resp 18 | Wt 222.0 lb

## 2024-05-22 DIAGNOSIS — N400 Enlarged prostate without lower urinary tract symptoms: Secondary | ICD-10-CM | POA: Diagnosis not present

## 2024-05-22 DIAGNOSIS — K5730 Diverticulosis of large intestine without perforation or abscess without bleeding: Secondary | ICD-10-CM | POA: Diagnosis not present

## 2024-05-22 DIAGNOSIS — Z8616 Personal history of COVID-19: Secondary | ICD-10-CM | POA: Diagnosis not present

## 2024-05-22 DIAGNOSIS — G4730 Sleep apnea, unspecified: Secondary | ICD-10-CM | POA: Insufficient documentation

## 2024-05-22 DIAGNOSIS — K219 Gastro-esophageal reflux disease without esophagitis: Secondary | ICD-10-CM | POA: Diagnosis not present

## 2024-05-22 DIAGNOSIS — Z809 Family history of malignant neoplasm, unspecified: Secondary | ICD-10-CM | POA: Diagnosis not present

## 2024-05-22 DIAGNOSIS — Z7982 Long term (current) use of aspirin: Secondary | ICD-10-CM | POA: Diagnosis not present

## 2024-05-22 DIAGNOSIS — R591 Generalized enlarged lymph nodes: Secondary | ICD-10-CM

## 2024-05-22 DIAGNOSIS — E559 Vitamin D deficiency, unspecified: Secondary | ICD-10-CM | POA: Diagnosis not present

## 2024-05-22 DIAGNOSIS — J449 Chronic obstructive pulmonary disease, unspecified: Secondary | ICD-10-CM | POA: Insufficient documentation

## 2024-05-22 DIAGNOSIS — E785 Hyperlipidemia, unspecified: Secondary | ICD-10-CM | POA: Insufficient documentation

## 2024-05-22 DIAGNOSIS — E119 Type 2 diabetes mellitus without complications: Secondary | ICD-10-CM | POA: Insufficient documentation

## 2024-05-22 DIAGNOSIS — Z79899 Other long term (current) drug therapy: Secondary | ICD-10-CM | POA: Diagnosis not present

## 2024-05-22 DIAGNOSIS — I6523 Occlusion and stenosis of bilateral carotid arteries: Secondary | ICD-10-CM | POA: Insufficient documentation

## 2024-05-22 DIAGNOSIS — Z87891 Personal history of nicotine dependence: Secondary | ICD-10-CM | POA: Insufficient documentation

## 2024-05-22 DIAGNOSIS — E871 Hypo-osmolality and hyponatremia: Secondary | ICD-10-CM | POA: Diagnosis not present

## 2024-05-22 DIAGNOSIS — I10 Essential (primary) hypertension: Secondary | ICD-10-CM | POA: Diagnosis not present

## 2024-05-22 DIAGNOSIS — C760 Malignant neoplasm of head, face and neck: Secondary | ICD-10-CM | POA: Diagnosis present

## 2024-05-22 DIAGNOSIS — Z7984 Long term (current) use of oral hypoglycemic drugs: Secondary | ICD-10-CM | POA: Diagnosis not present

## 2024-05-22 DIAGNOSIS — I700 Atherosclerosis of aorta: Secondary | ICD-10-CM | POA: Diagnosis not present

## 2024-05-22 LAB — CBC WITH DIFFERENTIAL (CANCER CENTER ONLY)
Abs Immature Granulocytes: 0.02 K/uL (ref 0.00–0.07)
Basophils Absolute: 0.1 K/uL (ref 0.0–0.1)
Basophils Relative: 1 %
Eosinophils Absolute: 0.2 K/uL (ref 0.0–0.5)
Eosinophils Relative: 2 %
HCT: 44.8 % (ref 39.0–52.0)
Hemoglobin: 15.2 g/dL (ref 13.0–17.0)
Immature Granulocytes: 0 %
Lymphocytes Relative: 26 %
Lymphs Abs: 1.9 K/uL (ref 0.7–4.0)
MCH: 30.5 pg (ref 26.0–34.0)
MCHC: 33.9 g/dL (ref 30.0–36.0)
MCV: 90 fL (ref 80.0–100.0)
Monocytes Absolute: 0.4 K/uL (ref 0.1–1.0)
Monocytes Relative: 6 %
Neutro Abs: 4.8 K/uL (ref 1.7–7.7)
Neutrophils Relative %: 65 %
Platelet Count: 244 K/uL (ref 150–400)
RBC: 4.98 MIL/uL (ref 4.22–5.81)
RDW: 13.6 % (ref 11.5–15.5)
WBC Count: 7.4 K/uL (ref 4.0–10.5)
nRBC: 0 % (ref 0.0–0.2)

## 2024-05-22 LAB — CMP (CANCER CENTER ONLY)
ALT: 18 U/L (ref 0–44)
AST: 30 U/L (ref 15–41)
Albumin: 4.4 g/dL (ref 3.5–5.0)
Alkaline Phosphatase: 73 U/L (ref 38–126)
Anion gap: 6 (ref 5–15)
BUN: 11 mg/dL (ref 8–23)
CO2: 30 mmol/L (ref 22–32)
Calcium: 9.6 mg/dL (ref 8.9–10.3)
Chloride: 104 mmol/L (ref 98–111)
Creatinine: 0.99 mg/dL (ref 0.61–1.24)
GFR, Estimated: >60 mL/min (ref >60)
Glucose, Bld: 158 mg/dL — ABNORMAL HIGH (ref 70–99)
Potassium: 3.9 mmol/L (ref 3.5–5.1)
Sodium: 140 mmol/L (ref 135–145)
Total Bilirubin: 0.6 mg/dL (ref 0.0–1.2)
Total Protein: 7.4 g/dL (ref 6.5–8.1)

## 2024-05-22 LAB — SEDIMENTATION RATE: Sed Rate: 8 mm/h (ref 0–16)

## 2024-05-22 LAB — HEPATITIS B SURFACE ANTIGEN: Hepatitis B Surface Ag: NONREACTIVE

## 2024-05-22 LAB — HEPATITIS C ANTIBODY: HCV Ab: NONREACTIVE

## 2024-05-22 LAB — HIV ANTIBODY (ROUTINE TESTING W REFLEX): HIV Screen 4th Generation wRfx: NONREACTIVE

## 2024-05-22 LAB — HEPATITIS B SURFACE ANTIBODY,QUALITATIVE: Hep B S Ab: NONREACTIVE

## 2024-05-22 LAB — C-REACTIVE PROTEIN: CRP: 0.7 mg/dL (ref <1.0)

## 2024-05-22 LAB — LACTATE DEHYDROGENASE: LDH: 107 U/L (ref 98–192)

## 2024-05-22 NOTE — Progress Notes (Signed)
 [facility] [phone]   [phone]  INITIAL CONSULTATION:  Patient Care Team: Teresa Channel, MD as PCP - General (Family Medicine) Golden Forestine BROCKS, RN as Oncology Nurse Navigator (Medical Oncology)  CHIEF COMPLAINTS/PURPOSE OF CONSULTATION:  lymphadenopathy   HISTORY OF PRESENTING ILLNESS:  Howard Simmons 73 y.o. male with medical history significant for hypertension, COPD, obstructive sleep apnea, GERD, type 2 diabetes, benign prostatic hyperplasia, hyperlipidemia, erectile dysfunction, vitamin D  deficiency, major depression, hyponatremia.   On review of the previous records patient is being referred by PCP Dr. Channel Teresa.  Patient was evaluated in office on 04/13/2024 for swollen lymph node.  Per documentation he had a 2 cm swollen area with nodule palpable at left angle of jaw that was treated with a prescription of amoxicillin.  ESR and CBC were overall unremarkable on 04/13/24. Patient was seen in follow-up on 9//2025 and it was noted that the lymph node was smaller about 1 cm in size.  Patient did also test positive for COVID during that visit.  CT soft tissue neck was performed on 05/08/2024 showing 4.5 x 3.4 cm ovoid soft lesion within the neck left upper at level 2 station.  A PET scan was subsequently performed on 05/19/2024 showing oval-shaped soft tissue mass in the left neck at the left level 2A nodal position measuring 2.8 x 3.5 cm with SUV 14.1.  PET scan did not show any additional areas of hypermetabolic focus in the neck.  There is no metabolic activity in the chest.  Radiologist did comment on presumed physiologic activity in the bowel and genitourinary systems.  On exam today patient is accompanied by spouse who provides additional details. Patient reports he recently started Ozempic for his diabetes management. For a couple of weeks in August he complained of feeling fatigued however though it could be from  the Ozempic injections. When he first noticed the swollen area under his chin he does not recall that it was painful. He denies any dental infections or issues. He reports the swelling decreased after amoxicillin however he thinks it has recently grown in size. He denies any night sweats, unintentional weight loss, difficulty swallowing or breathing, other areas of enlarged lymph nodes.   Family history is significant for cancer, with his father having died of a malignant brain tumor and his sister having died from breast cancer that metastasized to the bone. Patient is retired, previously worked with metals in a factory setting. He smoked for approx 15 years 2 ppd however estimates he quit 35 years ago. He does not drink alcohol. Last colonoscopy was in 11/2015 and PSA was checked 09/2023 with normal value of 1.44.  MEDICAL HISTORY:  Past Medical History:  Diagnosis Date   GERD (gastroesophageal reflux disease)    Hyperlipidemia    Hypertension     SURGICAL HISTORY: Past Surgical History:  Procedure Laterality Date   COLON SURGERY     COLONOSCOPY WITH PROPOFOL  N/A 12/02/2015   Procedure: COLONOSCOPY WITH PROPOFOL ;  Surgeon: Gladis MARLA Louder, MD;  Location: [facility];  Service: Endoscopy;  Laterality: N/A;    SOCIAL HISTORY: Social History   Socioeconomic History   Marital status: Married    Spouse name: Not on file   Number of children: Not on file   Years of education: Not on file   Highest education level: Not on file  Occupational History   Not on file  Tobacco Use   Smoking status: Former    Current  packs/day: 0.00    Types: Cigarettes    Quit date: 06/10/1996    Years since quitting: 27.9   Smokeless tobacco: Never  Vaping Use   Vaping status: Never Used  Substance and Sexual Activity   Alcohol use: No    Alcohol/week: 0.0 standard drinks of alcohol   Drug use: No   Sexual activity: Not on file  Other Topics Concern   Not on file  Social History Narrative   Not  on file   Social Drivers of Health   Financial Resource Strain: Not on file  Food Insecurity: Not on file  Transportation Needs: Not on file  Physical Activity: Not on file  Stress: Not on file  Social Connections: Not on file  Intimate Partner Violence: Not on file    FAMILY HISTORY: Family History  Problem Relation Age of Onset   Heart disease Mother    Stroke Mother    Cancer Father    Cancer Sister    COPD Brother    Heart disease Sister    COPD Sister     ALLERGIES:  is allergic to lisinopril.  MEDICATIONS:  Current Outpatient Medications  Medication Sig Dispense Refill   acetaminophen  (TYLENOL ) 325 MG tablet Take 1-2 tablets (325-650 mg total) by mouth every 4 (four) hours as needed for mild pain.     albuterol  (VENTOLIN  HFA) 108 (90 Base) MCG/ACT inhaler Inhale 1-2 puffs into the lungs every 6 (six) hours as needed for wheezing or shortness of breath.     aspirin EC 81 MG tablet 1 tablet Orally Once a day; Duration: 30 day(s)     atorvastatin  (LIPITOR) 20 MG tablet Take 20 mg by mouth daily.     cetirizine (ZYRTEC) 10 MG tablet Take 10 mg by mouth daily.     Cholecalciferol  (VITAMIN D3) 50 MCG (2000 UT) CHEW Chew 2,000 Units by mouth daily.     DULoxetine  (CYMBALTA ) 60 MG capsule Take 60 mg by mouth daily.     FARXIGA 10 MG TABS tablet Take 10 mg by mouth daily.     fluticasone  (FLONASE ) 50 MCG/ACT nasal spray Place 1 spray into both nostrils daily.     HYDROcodone  bit-homatropine (HYCODAN) 5-1.5 MG/5ML syrup Take 5 mLs by mouth every 6 (six) hours as needed for cough. 120 mL 0   ibuprofen (ADVIL) 200 MG tablet Take 200 mg by mouth every 6 (six) hours as needed for mild pain.     losartan  (COZAAR ) 50 MG tablet Take 50 mg by mouth daily.     metFORMIN (GLUCOPHAGE-XR) 500 MG 24 hr tablet Take 1,000 mg by mouth 2 (two) times daily.     omeprazole  (PRILOSEC ) 20 MG capsule Take 20 mg by mouth daily.     tadalafil (CIALIS) 20 MG tablet Take 20 mg by mouth as needed.      No current facility-administered medications for this visit.    REVIEW OF SYSTEMS:   All other systems are reviewed and are negative for acute change except as noted in the HPI.  PHYSICAL EXAMINATION: ECOG PERFORMANCE STATUS: 1 - Symptomatic but completely ambulatory  Vitals:   05/22/24 1222  BP: 114/79  Pulse: 86  Resp: 18  Temp: 97.9 F (36.6 C)  SpO2: 98%   Filed Weights   05/22/24 1222  Weight: 222 lb (100.7 kg)    Physical Exam Vitals reviewed.  Constitutional:      Appearance: He is not ill-appearing or toxic-appearing.  HENT:     Head: Normocephalic.  Right Ear: External ear normal.     Left Ear: External ear normal.     Nose: Nose normal.     Mouth/Throat:     Mouth: Mucous membranes are moist.     Pharynx: Oropharynx is clear.  Eyes:     General: No scleral icterus.    Conjunctiva/sclera: Conjunctivae normal.  Cardiovascular:     Rate and Rhythm: Normal rate and regular rhythm.     Pulses: Normal pulses.     Heart sounds: Normal heart sounds.  Pulmonary:     Effort: Pulmonary effort is normal.     Breath sounds: Normal breath sounds.  Abdominal:     General: There is no distension.  Musculoskeletal:        General: Normal range of motion.  Lymphadenopathy:     Upper Body:     Right upper body: No supraclavicular or axillary adenopathy.     Left upper body: No supraclavicular or axillary adenopathy.     Lower Body: No right inguinal adenopathy. No left inguinal adenopathy.     Comments: Approx 2 x 3 cm mass at angle of left jaw. Firm. No palpable fluctuance. Non tender.  Skin:    General: Skin is warm and dry.  Psychiatric:        Mood and Affect: Mood is anxious.       LABORATORY DATA:  I have reviewed the data as listed    Latest Ref Rng & Units 10/30/2021    4:25 AM 10/29/2021    4:20 AM  CBC  WBC 4.0 - 10.5 K/uL 4.6  6.2   Hemoglobin 13.0 - 17.0 g/dL 85.9  83.9   Hematocrit 39.0 - 52.0 % 42.0  46.7   Platelets 150 - 400 K/uL  184  187        Latest Ref Rng & Units 10/30/2021    4:25 AM 10/29/2021    4:20 AM  CMP  Glucose 70 - 99 mg/dL 889  848   BUN 8 - 23 mg/dL 14  18   Creatinine 9.38 - 1.24 mg/dL 9.02  9.03   Sodium 864 - 145 mmol/L 133  130   Potassium 3.5 - 5.1 mmol/L 4.2  3.4   Chloride 98 - 111 mmol/L 102  98   CO2 22 - 32 mmol/L 22  18   Calcium  8.9 - 10.3 mg/dL 8.5  8.6   Total Protein 6.5 - 8.1 g/dL 6.4    Total Bilirubin 0.3 - 1.2 mg/dL 0.4    Alkaline Phos 38 - 126 U/L 43    AST 15 - 41 U/L 48    ALT 0 - 44 U/L 26       RADIOGRAPHIC STUDIES: I have personally reviewed the radiological images as listed and agreed with the findings in the report. NM PET Image Initial (PI) Skull Base To Thigh (F-18 FDG) Result Date: 05/19/2024 EXAM: PET AND CT SKULL BASE TO MID THIGH 05/19/2024 08:38:14 AM TECHNIQUE: RADIOPHARMACEUTICAL: 11.0 mCi F-18 FDG Uptake time 60 minutes. Glucose level  mg/dl. PET imaging was acquired from the base of the skull to the mid thighs. Non-contrast enhanced computed tomography was obtained for attenuation correction and anatomic localization. COMPARISON: Neck CT 05/08/2024 CLINICAL HISTORY: Cervical lymphadenopathy. FINDINGS: HEAD AND NECK: Oval shaped soft tissue mass in the left neck at the left level IIA nodal position measures 2.8 x 3.5 cm on image 32 series 4 with maximum SUV 14.1, favoring malignancy. No other focal hypermetabolic activity is identified  in the neck region to indicate a separate primary site. CHEST: Thoracic aortic, coronary artery, and branch vessel atheromatous vascular calcifications. No metabolically active pulmonary nodules. No metabolically active lymphadenopathy. ABDOMEN AND PELVIS: Presumed physiologic activity in bowel. Physiologic activity within the genitourinary systems. Cholelithiasis. Systemic atherosclerosis is present, including the aorta and iliac arteries. No metabolically active intraperitoneal mass. No metabolically active lymphadenopathy. BONES  AND SOFT TISSUE: No abnormal FDG activity localizes to the bones. No metabolically active aggressive osseous lesion. IMPRESSION: 1. Hypermetabolic left level IIA neck mass/node (2.8 x 3.5 cm, SUV max 14.1), suspicious for malignancy. 2. No additional hypermetabolic focus in the neck to suggest a separate primary site. 3. Systemic atherosclerosis, including thoracic aorta, coronary arteries, and iliac arteries. 4. Cholelithiasis. Electronically signed by: Ryan Salvage MD 05/19/2024 08:55 AM EDT RP Workstation: [id number]   CT SOFT TISSUE NECK W CONTRAST Result Date: 05/10/2024 CLINICAL DATA:  Provided history: Lymphadenopathy. Additional history provided by the scanning technologist: The patient reports left neck swelling for three weeks, tender to touch. EXAM: CT NECK WITH CONTRAST TECHNIQUE: Multidetector CT imaging of the neck was performed using the standard protocol following the bolus administration of intravenous contrast. RADIATION DOSE REDUCTION: This exam was performed according to the departmental dose-optimization program which includes automated exposure control, adjustment of the mA and/or kV according to patient size and/or use of iterative reconstruction technique. CONTRAST:  75mL ISOVUE -370 IOPAMIDOL  (ISOVUE -370) INJECTION 76% COMPARISON:  Chest CT 10/29/2021. FINDINGS: Pharynx and larynx: Streak/beam hardening artifact arising from dental restoration partially obscures the oral cavity. No appreciable swelling or mass within the oral cavity, pharynx or larynx. Salivary glands: Punctate calcific focus within the right parotid gland, which could reflect a postinflammatory calcification or small salivary stone. Unremarkable appearance of the left parotid and bilateral submandibular glands. Thyroid : Unremarkable. Lymph nodes: 4.5 x 3.4 cm ovoid soft tissue lesion at the left level II station(for instance as seen on series 2, image 63) (series 12, image 109). The lesion is predominantly  homogeneous. However, there is a small hypodense component at the inferior aspect of the lesion, which could reflect cystic change or necrosis. This is suspicious for an abnormal, enlarged lymph node (such as from nodal metastatic disease). However, a primary mass is possible. No pathologically enlarged lymph nodes identified elsewhere within the neck. Vascular: The left internal jugular vein is completely effaced by the soft tissue lesion at the level of the mid/upper neck. The major vascular structures of the neck are otherwise patent. Atherosclerotic plaque within the aortic arch, proximal major branch vessels of the neck and carotid arteries. Limited intracranial: No evidence of an acute intracranial abnormality within the field of view. Visualized orbits: No orbital mass or acute orbital finding. Mastoids and visualized paranasal sinuses: Portions of the frontal sinuses are excluded from the field of view superiorly. Mild mucosal thickening within the left frontal sinus. No significant mastoid effusion. Skeleton: No acute fracture or aggressive osseous lesion. Multilevel bridging ventrolateral osteophytes within the visible upper thoracic spine. Mild chronic T2 vertebral compression fracture, unchanged from the prior chest CT of 10/29/2021. Upper chest: No consolidation within the imaged lung apices. Impression #1 will be called to the ordering clinician or representative by the Radiologist Assistant, and communication documented in the PACS or [employer]. IMPRESSION: 1. 4.5 x 3.4 cm ovoid soft lesion within the left upper neck (at the level 2 station). This is most suspicious for an abnormal, enlarged lymph node (such as from nodal metastatic disease). A primary mass is possible,  but not favored. Direct tissue sampling should be considered. Additionally, consider a whole-body PET-CT to assess for a primary mass and abnormal lymph nodes elsewhere. 2. The soft tissue lesion completely effaces the left  internal jugular vein within the mid/upper neck. 3. Additional non-acute findings as described within the body of the report. Electronically Signed   By: Rockey Childs D.O.   On: 05/10/2024 13:28    ASSESSMENT & PLAN Howard Simmons is a 73 y.o. male presenting to the [facility] for consultation regarding lymphadenopathy. Patient will proceed with laboratory workup today.   #Lymphadenopathy - Differentials include infectious process, inflammatory process, lymphoproliferative disorder or metastatic disease. - Reviewed CT and PET scan results with patient and spouse. - Will check labs today including CBC, CMP, LDH inflammatory markers, Hep B and C serologies, HIV, and flow cytometry. - With concern for lymphoma we are recommending an excisional biopsy instead of the core biopsy that has been previously ordered by other provider.. Urgent ENT referral has been placed.    #Age related screenings - UTD  -Patient will RTC when work up is complete.  Patient expressed understanding of the recommended workup and is agreeable to move forward.   All questions were answered. The patient knows to call the clinic with any problems, questions or concerns.  Shared visit with Dr. Federico.  Orders Placed This Encounter  Procedures   CBC with Differential (Cancer Center Only)   CMP (Cancer Center only)   C-reactive protein   Flow Cytometry, Peripheral Blood (Oncology)   Hepatitis B core antibody, total   Hepatitis B surface antibody,qualitative   Hepatitis B surface antigen   Hepatitis C antibody   HIV Antibody (routine testing w rflx)   Lactate dehydrogenase   Sedimentation rate   Ambulatory referral to ENT    Referral Priority:   Urgent    Referral Type:   Consultation    Referral Reason:   Specialty Services Required    Referred to Provider:   Tobie Eldora NOVAK, MD    Requested Specialty:   Otolaryngology    Number of Visits Requested:   1      I have spent a total of 60  minutes minutes of face-to-face and non-face-to-face time, preparing to see the patient, obtaining and/or reviewing separately obtained history, performing a medically appropriate examination, counseling and educating the patient, ordering medications/tests/procedures, referring and communicating with other health care professionals, documenting clinical information in the electronic health record, independently interpreting results and communicating results to the patient, and care coordination.   Ronnett Pullin Walisiewicz PA-C Department of Hematology/Oncology [facility] at [facility] Phone: [phone]  I have read the above note and personally examined the patient. I agree with the assessment and plan as noted above.  Briefly Mr. Anfernee Peschke is a 73 year old male who presents for evaluation of a left sided neck mass.  At this time findings appear most consistent with lymphadenopathy which could be secondary to head neck cancer versus a primary lymphoma.  He had a PET CT scan performed prior to our visit on 05/19/2024 which showed hypermetabolic left level 2A lymph node/neck mass at 2.8 x 3.5 cm.  There was no additional hypermetabolic focus in the neck to suggest a separate primary.  At this time we would recommend pursuing an excisional biopsy and consult to ENT.  If excisional biopsy is not feasible would recommend a core biopsy.  Additionally we will order our lymphadenopathy workup to include ESR, CRP, hepatitis  B, C, HIV\.  The patient voiced understanding of our findings and plan moving forward.   Norleen IVAR Kidney, MD Department of Hematology/Oncology [facility] at [facility] Phone: [phone] Pager: [phone] Email: [email or web address]

## 2024-05-22 NOTE — Progress Notes (Signed)
 [facility]  Patient presented to clinic, with spouse, for his scheduled appointment with PA-C Mallie Combes. I introduced myself and provided them with my direct contact information. Patient and spouse were both encouraged to call me with any questions/concerns they may have.  Colene KYM Raider, RN, BSN, [facility] Oncology Nurse Navigator, [facility] 05/22/2024 12:49 PM

## 2024-05-22 NOTE — Patient Instructions (Addendum)
 Diagnostic Clinic Office Visit Discharge Information and Instructions  Thank you for choosing [facility] for your healthcare needs.  Below is a summary of today's discussion, along with our contact information and an outline of what to expect next.  Reason for Visit:  enlarged lymph node  Proposed Diagnostic Care Plan: Labs collected today I have already reached out to our ENT providers requesting a biopsy. The office will call you to schedule an appointment. Typically they see you in office for a consult first and then schedule the biopsy.   What to Expect: - Generally, when lab tests are ordered the results can take up to 1 week for results to be available.  At that point, we will contact you to discuss your results with you.  Unless there is a critical result, we will typically wait for all of your lab results to be available before contacting you. - If a biopsy is part of your Care Plan, those results can take on average 7-10 days to result.  Once results are available, we will contact you to discuss your pathology results and any next steps. - If you have additional imaging ordered, such as a CT Scan, MRI, Ultrasound, Bone Scan, or PET scan, your imaging will need to be authorized then scheduled with the earliest available appointment.  You may be asked to travel to another hospital within [facility] who has a sooner availability, please consider doing so if asked. - If you use MyChart, your results will be available to you in the MyChart portal.  Your provider will be in touch with you as soon as all of your results are available to be discussed.  Your Diagnostic Clinic Provider:  Matson Welch Walisiewicz PA-C and Dr. Federico Your Diagnostic Navigator:  Colene Raider RN, office number [phone]  If you or your caregiver have number blocking on your cell phones, please ensure the cancer center's numbers are not blocked.  If you are not a registered MyChart user, please consider  enrolling in MyChart to receive your test results and visit notes.  You can also access your discharge instructions electronically.  MyChart also gives you an electronic means to communicate with your Care Team instead of needing to call in to the cancer center.  We appreciate you trusting us  with your healthcare and look forward to partnering with you as we work to uncover what your potential diagnosis may be.  Please do not hesitate to reach out at any point with questions or concerns.

## 2024-05-23 ENCOUNTER — Encounter: Payer: Self-pay | Admitting: Medical Oncology

## 2024-05-23 LAB — HEPATITIS B CORE ANTIBODY, TOTAL: HEP B CORE AB: NEGATIVE

## 2024-05-23 NOTE — Progress Notes (Signed)
 [facility]  Call to patient and spouse regarding Dr. Anthony recommendation for having a core biopsy and to inform him to keep the biopsy appointment schedule on October 15th and will call to cancel the appt with Dr. Tobie, scheduled for 1016/25, until we have results from the biopsy. Patient was confused with this information. Patient stated he was under the impression that the enlarged LN was going to be removed. Informed patient this was Dr. Anthony recommendation. Patient stated he would like to speak with Dr. Federico or PA-C regarding this new information.   Message sent to Dr. Federico and Mallie of patient's wishes.   Colene KYM Raider, RN, BSN, [facility] Oncology Nurse Navigator, [facility] 05/23/2024 1:27 PM

## 2024-05-24 ENCOUNTER — Telehealth: Payer: Self-pay | Admitting: Physician Assistant

## 2024-05-24 LAB — SURGICAL PATHOLOGY

## 2024-05-24 LAB — FLOW CYTOMETRY

## 2024-05-24 NOTE — Telephone Encounter (Signed)
 Contacted patient via telephone to discuss next steps for [facility] work up. Patient informed that after Dr. Tobie, ENT provider, has reviewed his case and imaging and his recommendation is to proceed with a core biopsy. If that biopsy is not diagnostic he will need ENT consult for excisional biopsy. Patient is scheduled for core biopsy on 05/31/24. Labs from [facility] visit are still in process and he will contacted once all have resulted. Patient agreeable with plan and has no further questions at this time. Dr. Federico also updated on plan.

## 2024-05-26 ENCOUNTER — Encounter: Payer: Self-pay | Admitting: Medical Oncology

## 2024-05-26 NOTE — Progress Notes (Signed)
 [facility]   Outgoing call to patient to inform him, per DEVONNA Gift, lab flow cytometry resulted with no abnormal cells. Patient also informed, per lab results, that he does not have hepatitis B immunity and has likely outgrown his childhood vaccination. It was recommended to the patient to follow up with his PCP for repeat hepatitis B vaccines once his work up with us  has been completed. Patient gave verbal understanding. Confirmed with patient his biopsy scheduled for 05/31/2024. Patient denies further questions at this time. Encouraged to call with questions/concerns.   Colene KYM Raider, RN, BSN, [facility] Oncology Nurse Navigator, [facility] 05/26/2024 3:54 PM

## 2024-05-29 ENCOUNTER — Telehealth: Payer: Self-pay | Admitting: Surgery

## 2024-05-29 NOTE — Telephone Encounter (Signed)
 Called patient back and let him know that per Dr Federico, ENT is recommending proceeding with core biopsy first and does not recommend excisional biopsy first. Advised pt, per Federico, the risk of spread following needle biopsy is minimal. Pt expressed continued concern about why excisional biopsy can't be done first but thanked me for this information and stated he would come for his biopsy on 05/31/24.

## 2024-05-29 NOTE — Telephone Encounter (Signed)
 Patient called stating that he was told her was initially scheduled to have enlarged lymph node removed but that his appointments have been changed to him having a needle biopsy first. Pt concerned because he has been told that there can be potential spread after a needle biopsy and if it needs to be removed regardless wouldn't it be better to remove it and then biopsy versus a needle biopsy first. Pt requesting more information on this before proceeding with needle biopsy. Pt advised Dr Federico would be notified and our office would call him back with his recommendations. Pt stated that it is ok to call his wife if he is unavailable by phone.

## 2024-05-30 ENCOUNTER — Other Ambulatory Visit: Payer: Self-pay

## 2024-05-31 ENCOUNTER — Ambulatory Visit (HOSPITAL_COMMUNITY)
Admission: RE | Admit: 2024-05-31 | Discharge: 2024-05-31 | Disposition: A | Discharge status: Home / Self Care | Source: Ambulatory Visit | Attending: Family Medicine | Admitting: Family Medicine

## 2024-05-31 DIAGNOSIS — C969 Malignant neoplasm of lymphoid, hematopoietic and related tissue, unspecified: Secondary | ICD-10-CM | POA: Diagnosis not present

## 2024-05-31 DIAGNOSIS — C801 Malignant (primary) neoplasm, unspecified: Secondary | ICD-10-CM | POA: Diagnosis not present

## 2024-05-31 DIAGNOSIS — R591 Generalized enlarged lymph nodes: Secondary | ICD-10-CM | POA: Diagnosis not present

## 2024-05-31 DIAGNOSIS — R221 Localized swelling, mass and lump, neck: Secondary | ICD-10-CM | POA: Diagnosis not present

## 2024-05-31 DIAGNOSIS — C770 Secondary and unspecified malignant neoplasm of lymph nodes of head, face and neck: Secondary | ICD-10-CM | POA: Diagnosis not present

## 2024-05-31 HISTORY — PX: IR US GUIDE BX ASP/DRAIN: IMG2392

## 2024-05-31 MED ORDER — LIDOCAINE HCL 1 % IJ SOLN
INTRAMUSCULAR | Status: AC
  Filled 2024-05-31: qty 20

## 2024-05-31 MED ORDER — LIDOCAINE HCL 1 % IJ SOLN: 20.0000 mL | Freq: Once | INTRAMUSCULAR | Status: DC

## 2024-06-01 ENCOUNTER — Institutional Professional Consult (permissible substitution) (INDEPENDENT_AMBULATORY_CARE_PROVIDER_SITE_OTHER): Admitting: Otolaryngology

## 2024-06-02 ENCOUNTER — Other Ambulatory Visit: Payer: Self-pay

## 2024-06-02 ENCOUNTER — Telehealth: Payer: Self-pay | Admitting: Oncology

## 2024-06-02 ENCOUNTER — Telehealth: Payer: Self-pay | Admitting: Physician Assistant

## 2024-06-02 DIAGNOSIS — C760 Malignant neoplasm of head, face and neck: Secondary | ICD-10-CM

## 2024-06-02 NOTE — Telephone Encounter (Signed)
 Scheduled new patient appointment. Called and spoke with the patient, he is aware.

## 2024-06-02 NOTE — Telephone Encounter (Signed)
 I notified Howard Simmons by phone regarding biopsy results. Per pathologist's preliminary report today the pathology shows neck biopsy is positive for squamous cell carcinoma. Patient is scheduled to follow up with Dr. Autumn to discuss final diagnosis and treatment recommendations on 06/09/24. All of patient's questions were answered and he expressed understanding of the plan provided. Head and neck navigator Delon PEAK will also help with referrals to ENT and radiation oncology.

## 2024-06-02 NOTE — Progress Notes (Signed)
 Oncology Nurse Navigator Documentation   Placed introductory call to new referral patient Howard Simmons. I spoke with him and his wife over speaker phone.  Introduced myself as the H&N oncology nurse navigator that works with Dr. Autumn and Dr. Izell to whom he has been referred by Kaitlyn Walisiewicz with our diagnostic program.  He confirmed understanding of referral. Briefly explained my role as his navigator, provided my contact information.  Confirmed understanding of upcoming appts and [facility] location, explained arrival and registration process. I encouraged him to call with questions/concerns as he moves forward with appts and procedures.  They are very anxious but know that I am working on referrals and appointments as quickly as possible.  He verbalized understanding of information provided, expressed appreciation for my call.   Delon Jefferson RN, BSN, OCN Head & Neck Oncology Nurse Navigator [facility] at [facility] Phone # [phone]  Fax # [phone]

## 2024-06-05 NOTE — Progress Notes (Signed)
  Rapid Diagnostic Service for Malignancies [facility]  Diagnostic Nurse Navigator Treatment Team Hand-Off Note  06/05/24  Patient Name:  Howard Simmons Patient MRN:  [id number] Patient DOB:  03/04/1951   Patient Care Team: Teresa Channel, MD as PCP - General (Family Medicine) Golden Forestine BROCKS, RN as Oncology Nurse Navigator (Medical Oncology)  Chief Complaint Squamous cell Carcinoma lymph node   Oncology History   No history exists.    Cancer Staging  No matching staging information was found for the patient.   SDOH Screening and Interventions Updated:  Yes  SDOH Screenings   Tobacco Use: Medium Risk (04/10/2024)     Genetics Assessment Completed:  No Genetics Referral Made:  not applicable  Care Team Updated:  Yes

## 2024-06-07 ENCOUNTER — Ambulatory Visit (INDEPENDENT_AMBULATORY_CARE_PROVIDER_SITE_OTHER): Admitting: Otolaryngology

## 2024-06-07 ENCOUNTER — Encounter (INDEPENDENT_AMBULATORY_CARE_PROVIDER_SITE_OTHER): Payer: Self-pay | Admitting: Otolaryngology

## 2024-06-07 VITALS — BP 129/79 | HR 86 | Ht 69.0 in | Wt 222.0 lb

## 2024-06-07 DIAGNOSIS — C4442 Squamous cell carcinoma of skin of scalp and neck: Secondary | ICD-10-CM

## 2024-06-07 NOTE — Progress Notes (Signed)
 Dear Dr. Autumn, Here is my assessment for our mutual patient, Howard Simmons. Thank you for allowing me the opportunity to care for your patient. Please do not hesitate to contact me should you have any other questions. Sincerely, Dr. Eldora Blanch  Otolaryngology Clinic Note Referring provider: Dr. Autumn HPI:  Initial visit (05/2024) Discussed the use of AI scribe software for clinical note transcription with the patient, who gave verbal consent to proceed.  History of Present Illness Howard Simmons is a 73 year old male who presents for left neck mass/squamous cell carcinoma of left nek. He is accompanied by wife and daughter.  He has a neck mass, noted otherwise incidentally. Overall painless. He feels like it has grown. There is no significant pain at the mass, dysphagia, odynophagia, otalgia, weight loss, hemoptysis, or significant dyspnea, other neck masses, dysphonia. He was seen by oncology, underwent a CT scan, PET scan, and biopsy. Amoxicillin for five days seemed to reduce the size of mass slightly. He has a past smoking history of 10-15 years at two and a half packs per day, quitting 30 years ago.   H&N Surgery: denies Personal or FHx of bleeding dz or anesthesia difficulty: no  GLP-1: no AP/AC: no  Tobacco: prior, quit (30 pack year)  Independent Review of Additional Tests or Records:  PET/CT and CT Neck independently interpreted: agree with read - left neck mass level 2; PET avid. Some asymmetry glossotonsillar sulcus; b/l tonsil stones(?); Unable to see any obviously apparent primary lesions; significant compressino left IJV IMPRESSION CT: 1. 4.5 x 3.4 cm ovoid soft lesion within the left upper neck (at the level 2 station). This is most suspicious for an abnormal, enlarged lymph node (such as from nodal metastatic disease). A primary mass is possible, but not favored. Direct tissue sampling should be considered. Additionally, consider a whole-body PET-CT to assess  for a primary mass and abnormal lymph nodes elsewhere. 2. The soft tissue lesion completely effaces the left internal jugular vein within the mid/upper neck. 3. Additional non-acute findings as described within the body of the report. IMPRESSION PET: 1. Hypermetabolic left level IIA neck mass/node (2.8 x 3.5 cm, SUV max 14.1), suspicious for malignancy. 2. No additional hypermetabolic focus in the neck to suggest a separate primary site. Path (Left Neck Bx) - 05/31/2024: SCCa; p16+ CBC and CMP 05/22/2024: WBC 7.4; BUN/Cr 11/0.99 Kiatlyn Walisiewicz notes 05/22/2024: noted left LAD - reviewed CT/PET; rec core bx, refe to ENT PMH/Meds/All/SocHx/FamHx/ROS:   Past Medical History:  Diagnosis Date   GERD (gastroesophageal reflux disease)    Hyperlipidemia    Hypertension      Past Surgical History:  Procedure Laterality Date   COLON SURGERY     COLONOSCOPY WITH PROPOFOL  N/A 12/02/2015   Procedure: COLONOSCOPY WITH PROPOFOL ;  Surgeon: Gladis MARLA Louder, MD;  Location: [facility];  Service: Endoscopy;  Laterality: N/A;   IR US  GUIDE BX ASP/DRAIN  05/31/2024    Family History  Problem Relation Age of Onset   Heart disease Mother    Stroke Mother    Cancer Father    Cancer Sister    COPD Brother    Heart disease Sister    COPD Sister      Social Connections: Not on file      Current Outpatient Medications:    acetaminophen  (TYLENOL ) 325 MG tablet, Take 1-2 tablets (325-650 mg total) by mouth every 4 (four) hours as needed for mild pain., Disp: , Rfl:    albuterol  (VENTOLIN   HFA) 108 (90 Base) MCG/ACT inhaler, Inhale 1-2 puffs into the lungs every 6 (six) hours as needed for wheezing or shortness of breath., Disp: , Rfl:    aspirin EC 81 MG tablet, 1 tablet Orally Once a day; Duration: 30 day(s), Disp: , Rfl:    atorvastatin  (LIPITOR) 20 MG tablet, Take 20 mg by mouth daily., Disp: , Rfl:    cetirizine (ZYRTEC) 10 MG tablet, Take 10 mg by mouth daily., Disp: , Rfl:     Cholecalciferol  (VITAMIN D3) 50 MCG (2000 UT) CHEW, Chew 2,000 Units by mouth daily., Disp: , Rfl:    DULoxetine  (CYMBALTA ) 60 MG capsule, Take 60 mg by mouth daily., Disp: , Rfl:    FARXIGA 10 MG TABS tablet, Take 10 mg by mouth daily., Disp: , Rfl:    fluticasone  (FLONASE ) 50 MCG/ACT nasal spray, Place 1 spray into both nostrils daily., Disp: , Rfl:    HYDROcodone  bit-homatropine (HYCODAN) 5-1.5 MG/5ML syrup, Take 5 mLs by mouth every 6 (six) hours as needed for cough., Disp: 120 mL, Rfl: 0   ibuprofen (ADVIL) 200 MG tablet, Take 200 mg by mouth every 6 (six) hours as needed for mild pain., Disp: , Rfl:    losartan  (COZAAR ) 50 MG tablet, Take 50 mg by mouth daily., Disp: , Rfl:    metFORMIN (GLUCOPHAGE-XR) 500 MG 24 hr tablet, Take 1,000 mg by mouth 2 (two) times daily., Disp: , Rfl:    omeprazole  (PRILOSEC ) 20 MG capsule, Take 20 mg by mouth daily., Disp: , Rfl:    tadalafil (CIALIS) 20 MG tablet, Take 20 mg by mouth as needed., Disp: , Rfl:    Physical Exam:   BP 129/79   Pulse 86   Ht 5' 9 (1.753 m)   Wt 222 lb (100.7 kg)   SpO2 93%   BMI 32.78 kg/m   Salient findings:  CN II-XII intact  Bilateral EAC clear and TM intact with well pneumatized middle ear spaces Anterior rhinoscopy: Septum intact; bilateral inferior turbinates without significant hypertrophy No lesions of oral cavity/oropharynx EXCEPT on palpable, left GT sulcus seems more tender than right; tonsils soft Left level 2 mass - appears mobile however, not fixed; able to turn head without issue No respiratory distress or stridor; TFL was indicated to better evaluate the proximal airway, given the patient's history and exam findings, and is detailed below.  Seprately Identifiable Procedures:  Prior to initiating any procedures, risks/benefits/alternatives were explained to the patient and verbal consent obtained. Procedure Note Pre-procedure diagnosis: Left neck squamous cell carcinoma and mass; Post-procedure  diagnosis: Same Procedure: Transnasal Fiberoptic Laryngoscopy, CPT 31575 - Mod 25 Indication: see above Complications: None apparent EBL: 0 mL  The procedure was undertaken to further evaluate the patient's complaint above, with mirror exam inadequate for appropriate examination due to gag reflex and poor patient tolerance  Procedure:  Patient was identified as correct patient. Verbal consent was obtained. The nose was sprayed with oxymetazoline and 4% lidocaine . The The flexible laryngoscope was passed through the nose to view the nasal cavity, pharynx (oropharynx, hypopharynx) and larynx.  The larynx was examined at rest and during multiple phonatory tasks. Documentation was obtained and reviewed with patient. The scope was removed. The patient tolerated the procedure well.  Findings: The nasal cavity and nasopharynx did not reveal any masses or lesions, mucosa appeared to be without obvious lesions. The tongue base, pharyngeal walls, piriform sinuses, vallecula, epiglottis and postcricoid region are normal in appearance EXCEPT - slightly more erythematous, potentially raised  area left GT sulcus The visualized portion of the subglottis and proximal trachea is widely patent. The vocal folds are mobile bilaterally. There are no lesions on the free edge of the vocal folds nor elsewhere in the larynx worrisome for malignancy.    Electronically signed by: [name], MD 06/07/2024 2:22 PM   Impression & Plans:  Dyllen Menning is a 73 y.o. male with:  1. Squamous cell carcinoma of neck    SCCa Neck of unknown primary with 1+ LN that is known. Suspect maybe small left GT sulcus primary. We discussed options and need for further information. Will proceed with DL/Bx first -- possible tonsillectomy. If primary found, can consider TORS +/- Rads v/s CRT. He's leaning towards surgery - Case posted   See below regarding exact medications prescribed this encounter including dosages and route: No  orders of the defined types were placed in this encounter.     Thank you for allowing me the opportunity to care for your patient. Please do not hesitate to contact me should you have any other questions.  Sincerely, Eldora Blanch, MD Otolaryngologist (ENT), [facility] Phone: [phone] Fax: [phone]  06/07/2024, 2:22 PM   I have personally spent 61 minutes involved in face-to-face and non-face-to-face activities for this patient on the day of the visit.  Professional time spent excludes any procedures performed but includes the following activities, in addition to those noted in the documentation: preparing to see the patient (review of outside documentation and results), performing a medically appropriate examination, counseling, documenting in the electronic health record, independently interpreting results (CT, PET)

## 2024-06-08 ENCOUNTER — Encounter: Payer: Self-pay | Admitting: Radiation Oncology

## 2024-06-08 NOTE — Progress Notes (Signed)
 Head and Neck Cancer Location of Tumor / Histology:  Left Neck Mass/ Squamous Cell Carcinoma of the Left Neck   Patient presented months ago with symptoms of:  Patient felt a painless lump on the left side of throat.   Biopsies revealed:    Nutrition Status Yes No Comments  Weight changes? []  [x]    Swallowing concerns? []  [x]    PEG? []  [x]     Referrals Yes No Comments  Social Work? [x]  []    Dentistry? [x]  []    Swallowing therapy? [x]  []    Nutrition? [x]  []    Med/Onc? [x]  []     Safety Issues Yes No Comments  Prior radiation? []  [x]    Pacemaker/ICD? []  [x]    Possible current pregnancy? []  [x]    Is the patient on methotrexate? []  [x]     Tobacco/Marijuana/Snuff/ETOH use: former smoker, quit 30 years ago  Past/Anticipated interventions by otolaryngology, if any:   Past/Anticipated interventions by medical oncology, if any:      Current Complaints / other details:  None

## 2024-06-08 NOTE — Progress Notes (Signed)
 Radiation Oncology         [phone] ________________________________  Initial Outpatient Consultation  Name: NUMAN ZYLSTRA MRN: [id number]  Date: 06/09/2024  DOB: November 23, 1950  CC:Teresa Channel, MD  Autumn Millman, MD   REFERRING PHYSICIAN: Autumn Millman, MD  DIAGNOSIS: C76.0    ICD-10-CM   1. Squamous cell carcinoma of head and neck region  C44.42     2. Head and neck cancer (HCC)  C76.0        Cancer Staging  Head and neck cancer (HCC) Staging form: Pharynx - HPV-Mediated Oropharynx, AJCC 8th Edition - Clinical stage from 06/09/2024: Stage I (cT0, cN1, cM0, p16+) - Signed by Izell Domino, MD on 06/11/2024 Stage prefix: Initial diagnosis  Squamous cell carcinoma of the left neck/left neck mass (unknown primary)  CHIEF COMPLAINT: Here to discuss management of neck cancer  HISTORY OF PRESENT ILLNESS::Xayden DAGMAWI VENABLE is a 73 y.o. male who presented to his PCP on 04/13/24 with left sided cervical lymphadenopathy at the left angle of the jaw. He was treated with a course of amoxicillin which did result in an approximately 1 cm decrease in size of the palpable area (from 2 cm). He also tested positive for COVID in early September 2025 which preceded the time that he had a soft tissue neck CT performed on 05/08/24 which demonstrated a 4.5 x 3.4 cm ovoid soft lesion within the left upper neck (at the level 2 station) favoring an enlarged lymph node, and completely effacing the left internal jugular vein within the mid/upper neck.  A PET scan was subsequently performed on 05/19/2024 which demonstrated an oval-shaped soft tissue mass in the left neck at the left level 2A nodal position measuring 2.8 x 3.5 cm with an SUV max of 14.1. Imaging otherwise showed no additional areas of hypermetabolism in the neck or chest that would suggest a primary site.    He was accordingly referred to [facility] medical oncology and was evaluated by [facility] on 05/22/24. He did report noticing that the  mass had increased in size recently   A biopsy of the left neck node/mass was accordingly obtained on 05/31/24. Pathology showed findings consistent with squamous cell carcinoma; p16 strongly positive (w/ lymphoid tissue present in the sample).   He was recently seen in consultation by Dr. Tobie [facility]NT) on 06/07/24 to discuss treatment options. Dr. Tobie does suspect that this may be a small left GT sulcus primary however further work-up is required to ID a definitive primary. In this setting, Dr. Tobie would like him to proceed with DL/Bx first (along with a possible tonsillectomy). If a primary found, he may be considered for TORS +/- radiation, vs CRT.   Swallowing issues, if any: none  Weight Changes: none   Other symptoms: none  Tobacco history, if any: former smoker; smoked for 15 years; prior use consisted of 2 ppd; quit approximately 35 years ago   ETOH abuse, if any: none    PREVIOUS RADIATION THERAPY: No  PAST MEDICAL HISTORY:  has a past medical history of GERD (gastroesophageal reflux disease), Hyperlipidemia, and Hypertension.    PAST SURGICAL HISTORY: Past Surgical History:  Procedure Laterality Date   COLON SURGERY     COLONOSCOPY WITH PROPOFOL  N/A 12/02/2015   Procedure: COLONOSCOPY WITH PROPOFOL ;  Surgeon: Gladis MARLA Louder, MD;  Location: [facility] END[facility]COPY;  Service: Endoscopy;  Laterality: N/A;   IR US  GUIDE BX ASP/DRAIN  05/31/2024    FAMILY HISTORY: family history includes COPD in his brother  and sister; Cancer in his father and sister; Heart disease in his mother and sister; Stroke in his mother.  SOCIAL HISTORY:  reports that he quit smoking about 28 years ago. His smoking use included cigarettes. He has never used smokeless tobacco. He reports that he does not drink alcohol and does not use drugs.  ALLERGIES: Lisinopril and Ozempic (0.25 or 0.5 mg-dose) [semaglutide(0.25 or 0.5mg -dos)]  MEDICATIONS:  Current Outpatient Medications  Medication Sig Dispense  Refill   acetaminophen  (TYLENOL ) 325 MG tablet Take 1-2 tablets (325-650 mg total) by mouth every 4 (four) hours as needed for mild pain.     albuterol  (VENTOLIN  HFA) 108 (90 Base) MCG/ACT inhaler Inhale 1-2 puffs into the lungs every 6 (six) hours as needed for wheezing or shortness of breath.     aspirin EC 81 MG tablet 1 tablet Orally Once a day; Duration: 30 day(s)     atorvastatin  (LIPITOR) 20 MG tablet Take 20 mg by mouth daily.     cetirizine (ZYRTEC) 10 MG tablet Take 10 mg by mouth daily.     Cholecalciferol  (VITAMIN D3) 50 MCG (2000 UT) CHEW Chew 2,000 Units by mouth daily.     DULoxetine  (CYMBALTA ) 60 MG capsule Take 60 mg by mouth daily.     FARXIGA 10 MG TABS tablet Take 10 mg by mouth daily.     fluticasone  (FLONASE ) 50 MCG/ACT nasal spray Place 1 spray into both nostrils daily.     HYDROcodone  bit-homatropine (HYCODAN) 5-1.5 MG/5ML syrup Take 5 mLs by mouth every 6 (six) hours as needed for cough. 120 mL 0   ibuprofen (ADVIL) 200 MG tablet Take 200 mg by mouth every 6 (six) hours as needed for mild pain.     losartan  (COZAAR ) 50 MG tablet Take 50 mg by mouth daily.     metFORMIN (GLUCOPHAGE-XR) 500 MG 24 hr tablet Take 1,000 mg by mouth 2 (two) times daily.     omeprazole  (PRILOSEC ) 20 MG capsule Take 20 mg by mouth daily.     tadalafil (CIALIS) 20 MG tablet Take 20 mg by mouth as needed.     No current facility-administered medications for this encounter.    REVIEW OF SYSTEMS:  Notable for that above.   PHYSICAL EXAM:  height is 6' (1.829 m) and weight is 221 lb 6.4 oz (100.4 kg). His temperature is 97.8 F (36.6 C). His blood pressure is 134/76 and his pulse is 91. His respiration is 20 and oxygen saturation is 97%.   General: Alert and oriented, in no acute distress HEENT: Head is normocephalic. Extraocular movements are intact. Oropharynx is notable for no lesions. Metal fillings in teeth. Neck: Neck is notable for left level 2 cervical mass, approx 4cm Heart: Regular in  rate and rhythm with no murmurs, rubs, or gallops. Chest: Clear to auscultation bilaterally, with no rhonchi, wheezes, or rales. Abdomen: Soft, nontender, nondistended, with no rigidity or guarding. Extremities: No cyanosis or edema. Lymphatics: see Neck Exam Skin: No concerning lesions. Musculoskeletal: symmetric strength and muscle tone throughout. Neurologic: Cranial nerves II through XII are grossly intact. No obvious focalities. Speech is fluent. Coordination is intact. Psychiatric: Judgment and insight are intact. Affect is appropriate.   ECOG = 1 0 - Asymptomatic (Fully active, able to carry on all predisease activities without restriction)  1 - Symptomatic but completely ambulatory (Restricted in physically strenuous activity but ambulatory and able to carry out work of a light or sedentary nature. For example, light housework, office work)  2 -  Symptomatic, <50% in bed during the day (Ambulatory and capable of all self care but unable to carry out any work activities. Up and about more than 50% of waking hours)  3 - Symptomatic, >50% in bed, but not bedbound (Capable of only limited self-care, confined to bed or chair 50% or more of waking hours)  4 - Bedbound (Completely disabled. Cannot carry on any self-care. Totally confined to bed or chair)  5 - Death   Raylene MM, Creech RH, Tormey DC, et al. [phone]). Toxicity and response criteria of the [facility]. Am. DOROTHA Bridges. Oncol. 5 (6): 649-55   LABORATORY DATA:  Lab Results  Component Value Date   WBC 7.4 05/22/2024   HGB 15.2 05/22/2024   HCT 44.8 05/22/2024   MCV 90.0 05/22/2024   PLT 244 05/22/2024   CMP     Component Value Date/Time   NA 140 05/22/2024 1341   K 3.9 05/22/2024 1341   CL 104 05/22/2024 1341   CO2 30 05/22/2024 1341   GLUCOSE 158 (H) 05/22/2024 1341   BUN 11 05/22/2024 1341   CREATININE 0.99 05/22/2024 1341   CALCIUM  9.6 05/22/2024 1341   PROT 7.4 05/22/2024 1341   ALBUMIN  4.4 05/22/2024 1341   AST 30 05/22/2024 1341   ALT 18 05/22/2024 1341   ALKPHOS 73 05/22/2024 1341   BILITOT 0.6 05/22/2024 1341   GFRNONAA >60 05/22/2024 1341      No results found for: TSH   RADIOGRAPHY: IR US  Guide Bx Asp/Drain Result Date: 05/31/2024 CLINICAL DATA:  Left-sided neck mass EXAM: US  IMAGE GUIDED percutaneous biopsy COMPARISON:  None Available. FINDINGS: The left neck was evaluated with ultrasound and demonstrates a large soft tissue mass approximately 1 cm deep to skin and corresponding with the FDG avid Mass identified on the 05/19/2024 PET scan. The skin was prepped and draped in usual sterile fashion using chlorhexidine. Local anesthesia was achieved with 1% lidocaine  by infiltrating subcutaneous tissue prior to the biopsy. A small incision was made. Biopsy device was then advanced through the small incision under ultrasound guidance until the tip of the needle was identified within the proximal edge of the lesion. A 2.3 cm throw was chosen on the biopsy needle and a sample was obtained measuring the length. Sample was placed in formalin and then a second sample of the same length was obtained. Post biopsy images demonstrate no hemorrhage. Sterile dressing applied. IMPRESSION: Satisfactory left neck core needle biopsy using an 18 gauge BioPince needle with ultrasound guidance. Electronically Signed   By: Cordella Banner   On: 05/31/2024 13:32   NM PET Image Initial (PI) Skull Base To Thigh (F-18 FDG) Result Date: 05/19/2024 EXAM: PET AND CT SKULL BASE TO MID THIGH 05/19/2024 08:38:14 AM TECHNIQUE: RADIOPHARMACEUTICAL: 11.0 mCi F-18 FDG Uptake time 60 minutes. Glucose level  mg/dl. PET imaging was acquired from the base of the skull to the mid thighs. Non-contrast enhanced computed tomography was obtained for attenuation correction and anatomic localization. COMPARISON: Neck CT 05/08/2024 CLINICAL HISTORY: Cervical lymphadenopathy. FINDINGS: HEAD AND NECK: Oval shaped soft tissue  mass in the left neck at the left level IIA nodal position measures 2.8 x 3.5 cm on image 32 series 4 with maximum SUV 14.1, favoring malignancy. No other focal hypermetabolic activity is identified in the neck region to indicate a separate primary site. CHEST: Thoracic aortic, coronary artery, and branch vessel atheromatous vascular calcifications. No metabolically active pulmonary nodules. No metabolically active lymphadenopathy. ABDOMEN AND PELVIS: Presumed  physiologic activity in bowel. Physiologic activity within the genitourinary systems. Cholelithiasis. Systemic atherosclerosis is present, including the aorta and iliac arteries. No metabolically active intraperitoneal mass. No metabolically active lymphadenopathy. BONES AND SOFT TISSUE: No abnormal FDG activity localizes to the bones. No metabolically active aggressive osseous lesion. IMPRESSION: 1. Hypermetabolic left level IIA neck mass/node (2.8 x 3.5 cm, SUV max 14.1), suspicious for malignancy. 2. No additional hypermetabolic focus in the neck to suggest a separate primary site. 3. Systemic atherosclerosis, including thoracic aorta, coronary arteries, and iliac arteries. 4. Cholelithiasis. Electronically signed by: Ryan Salvage MD 05/19/2024 08:55 AM EDT RP Workstation: [id number]      IMPRESSION/PLAN:  This is a delightful patient with head and neck cancer. Unknown Primary.  Mucosal bx's pending.  EBV pending, p16 +. Distant smoking history.   We discussed possible surgery upfront vs definitive ChRT.  If surgery is done upfront, radiation will improve his prognosis adjuvantly.  The goal - if surgery is attempted - would be to avoid the need for chemotherapy. I will discuss this further with his team.  We discussed the potential risks, benefits, and side effects of radiotherapy. We talked in detail about acute and late effects. We discussed that some of the most bothersome acute effects may be mucositis, dysgeusia, salivary changes, skin  irritation, hair loss, dehydration, weight loss and fatigue. We talked about late effects which include but are not necessarily limited to dysphagia, hypothyroidism, nerve injury, vascular injury, spinal cord injury, xerostomia, trismus, neck edema, dental issues, non-healing wound, and potentially fatal injury to any of the tissues in the head and neck region. No guarantees of treatment were given. A consent form was signed and placed in the patient's medical record. The patient is enthusiastic about proceeding with treatment. I look forward to participating in the patient's care.    Simulation (treatment planning) will take place after clearance from ENT and dentistry  We also discussed that the treatment of head and neck cancer is a multidisciplinary process to maximize treatment outcomes and quality of life. For this reason the following referrals have been or will be made:   Medical oncology to discuss chemotherapy    Dentistry for dental evaluation, possible extractions in the radiation fields, and /or advice on reducing risk of cavities, osteoradionecrosis, or other oral issues. SPDs.  Special predictive radiation document sent to Dr Danial.   Nutritionist for nutrition support during and after treatment. Discussed blood sugar management and improvement of avoiding weight loss/ weight loss meds. Discussed possible PEG tube - he is receptive if it's needed, which is likely especially during ChRT.   Speech language pathology for swallowing and/or speech therapy.   Social work for social support.    Physical therapy due to risk of lymphedema in neck and deconditioning.   Baseline labs including TSH.  On date of service, in total, I spent 80 minutes on this encounter. Patient was seen in person. Note signed after encounter date; minutes pertain to date of service, only.   __________________________________________   Lauraine Golden, MD  This document serves as a record of services  personally performed by Lauraine Golden, MD. It was created on her behalf by Dorthy Fuse, a trained medical scribe. The creation of this record is based on the scribe's personal observations and the provider's statements to them. This document has been checked and approved by the attending provider.

## 2024-06-09 ENCOUNTER — Inpatient Hospital Stay: Admitting: Oncology

## 2024-06-09 ENCOUNTER — Inpatient Hospital Stay

## 2024-06-09 ENCOUNTER — Ambulatory Visit
Admission: RE | Admit: 2024-06-09 | Discharge: 2024-06-09 | Disposition: A | Discharge status: Home / Self Care | Source: Ambulatory Visit | Attending: Radiation Oncology | Admitting: Radiation Oncology

## 2024-06-09 ENCOUNTER — Encounter: Payer: Self-pay | Admitting: Oncology

## 2024-06-09 VITALS — BP 116/78 | HR 81 | Temp 97.9°F | Resp 17 | Ht 69.0 in | Wt 222.6 lb

## 2024-06-09 VITALS — BP 134/76 | HR 91 | Temp 97.8°F | Resp 20 | Ht 72.0 in | Wt 221.4 lb

## 2024-06-09 DIAGNOSIS — C4442 Squamous cell carcinoma of skin of scalp and neck: Secondary | ICD-10-CM

## 2024-06-09 DIAGNOSIS — C760 Malignant neoplasm of head, face and neck: Secondary | ICD-10-CM | POA: Diagnosis not present

## 2024-06-09 NOTE — Progress Notes (Signed)
 Oncology Nurse Navigator Documentation   Met with patient during initial consult with Dr. Autumn. He was accompanied by his wife and daughter.  Further introduced myself as his/their Navigator, explained my role as a member of the Care Team. Assisted with post-consult appt scheduling. I will see them again this afternoon with Dr. Izell.  They verbalized understanding of information provided. I encouraged them to call with questions/concerns moving forward.  Howard Jefferson, RN, BSN, OCN Head & Neck Oncology Nurse Navigator [facility] [place] [phone]

## 2024-06-09 NOTE — Assessment & Plan Note (Signed)
 Please review oncology history for additional details and timeline of events.  Cervical lymph node biopsy on 05/31/2024 confirmed squamous cell carcinoma, likely originating from the head and neck region.  p16 positive.    PET scan did not identify a primary lesion. The lymph node measures 3.5 cm, with irregularity in the tonsil area not highlighted on PET. Treatment depends on final pathology, including lymph node size and extracapsular spread. Curative intent with options of surgery, radiation, and chemotherapy based on post-surgical risk features. If surgery cannot ensure complete resection, chemo-radiation offers similar success rates. Surgical risks include xerostomia, dysgeusia, and potential reconstruction. Chemo-radiation risks include nausea, vomiting, fatigue, neuropathy, and ototoxicity.  Dr. Tobie is planning to proceed with diagnostic laryngoscopy and biopsy with possible tonsillectomy, currently scheduled on 06/14/2024. If primary found, can consider TORS +/- [place] v/s CRT. He's leaning towards surgery.  Plan to reevaluate him after above workup with Dr. Tobie.  He may be referred to oral surgeons at [facility] for surgical consultation.  - We will plan for concurrent chemo-radiation if surgery cannot ensure complete cancer removal.  Our nurse navigator [facility] will coordinate appointments.  Patient is scheduled to see Dr. Izell later today for radiation oncology evaluation.

## 2024-06-09 NOTE — Progress Notes (Signed)
 Oncology Nurse Navigator Documentation   At the request of Dr. Izell I have requested EBV testing to be added to Howard Simmons's biopsy completed on 05/31/24.   I also met with patient before his initial consult with Dr. Izell. He was accompanied by his wife and daughter.  Provided New Patient resource guide binder: Contact information for physicians, this navigator, other members of the Care Team Advance Directive information; provided [facility] AD booklet at their request,  Fall Prevention Patient Safety Plan Financial Assistance Information sheet Symptom Management Clinic information [facility] map with highlight of [facility] SLP Information sheet Head and Neck cancer basics Nutrition information Patient and family support information including Spiritual care/Chaplain information, Peer mentor program, health and wellness classes, and the survivorship program Community resources  Provided and discussed educational handouts for PEG and PAC. I discussed likely referral to dental medicine for dental clearance and SPD fabrication before beginning radiation.   They verbalized understanding of information provided. I encouraged them to call with questions/concerns moving forward.  Delon Jefferson, RN, BSN, OCN Head & Neck Oncology Nurse Navigator [facility] [place] [phone]

## 2024-06-09 NOTE — Progress Notes (Signed)
 [facility]  ONCOLOGY CONSULT NOTE   PATIENT NAME: Howard Simmons   MR#: [id number] DOB: 06/16/1951  DATE OF SERVICE: 06/09/2024   Patient Care Team: Teresa Channel, MD as PCP - General (Family Medicine) Tobie Eldora NOVAK, MD as Consulting Physician (Otolaryngology) Izell Domino, MD as Attending Physician (Radiation Oncology) Autumn Millman, MD as Consulting Physician (Oncology)    CHIEF COMPLAINT/ PURPOSE OF CONSULTATION:   Squamous cell carcinoma of head and neck region, unknown primary  ASSESSMENT & PLAN:   Howard Simmons is a 73 y.o. gentleman with a past medical history of hypertension, COPD, obstructive sleep apnea, GERD, type 2 diabetes, benign prostatic hyperplasia, hyperlipidemia, erectile dysfunction, vitamin D  deficiency, major depression, was referred to our clinic for squamous cell carcinoma of head and neck region, unknown primary, diagnosed after he left neck lymph node biopsy.  Squamous cell carcinoma of head and neck region Please review oncology history for additional details and timeline of events.  Cervical lymph node biopsy on 05/31/2024 confirmed squamous cell carcinoma, likely originating from the head and neck region.  p16 positive.    PET scan did not identify a primary lesion. The lymph node measures 3.5 cm, with irregularity in the tonsil area not highlighted on PET. Treatment depends on final pathology, including lymph node size and extracapsular spread. Curative intent with options of surgery, radiation, and chemotherapy based on post-surgical risk features. If surgery cannot ensure complete resection, chemo-radiation offers similar success rates. Surgical risks include xerostomia, dysgeusia, and potential reconstruction. Chemo-radiation risks include nausea, vomiting, fatigue, neuropathy, and ototoxicity.  Dr. Tobie is planning to proceed with diagnostic laryngoscopy and biopsy with possible tonsillectomy, currently scheduled on  06/14/2024. If primary found, can consider TORS +/- Rads v/s CRT. He's leaning towards surgery.  Plan to reevaluate him after above workup with Dr. Tobie.  He may be referred to oral surgeons at [facility] for surgical consultation.  - We will plan for concurrent chemo-radiation if surgery cannot ensure complete cancer removal.  Our nurse navigator [facility] will coordinate appointments.  Patient is scheduled to see Dr. Izell later today for radiation oncology evaluation.  Hx of Diverticulosis with previous colon surgery Previous colon surgery for perforation required temporary colostomy, now reversed. No symptoms or known cause for initial perforation, with diverticulosis as a possible underlying condition.  I reviewed lab results and outside records for this visit and discussed relevant results with the patient. Diagnosis, plan of care and treatment options were also discussed in detail with the patient. Opportunity provided to ask questions and answers provided to his apparent satisfaction. Provided instructions to call our clinic with any problems, questions or concerns prior to return visit. I recommended to continue follow-up with PCP and sub-specialists. He verbalized understanding and agreed with the plan. No barriers to learning was detected.  NCCN guidelines have been consulted in the planning of this patient's care.  Millman Autumn, MD  06/09/2024 7:09 PM  [facility] - A DEPT OF [facility] [address] [place] [address] Dept: [phone] Dept Fax: [phone]   HISTORY OF PRESENTING ILLNESS:   I have reviewed his chart and materials related to his cancer extensively and collaborated history with the patient. Summary of oncologic history is as follows:  ONCOLOGY HISTORY:  Patient was evaluated in his PCP's office on 04/13/2024 for swollen lymph node.  Per documentation he had a 2  cm swollen  area with nodule palpable at left angle of jaw that was treated with a prescription of amoxicillin.  ESR and CBC were overall unremarkable on 04/13/24. Patient was seen in follow-up on 9//2025 and it was noted that the lymph node was smaller about 1 cm in size.  Patient did also test positive for COVID during that visit.   CT soft tissue neck was performed on 05/08/2024 showing 4.5 x 3.4 cm ovoid soft lesion within the neck left upper at level 2 station.   A PET scan was subsequently performed on 05/19/2024 showing oval-shaped soft tissue mass in the left neck at the left level 2A nodal position measuring 2.8 x 3.5 cm with SUV 14.1.  PET scan did not show any additional areas of hypermetabolic focus in the neck.  There is no metabolic activity in the chest.  Radiologist did comment on presumed physiologic activity in the bowel and genitourinary systems.   Patient was seen in our rapid diagnostic clinic on 05/22/2024.  A referral was sent to ENT for further evaluation.  Dr.Kunjan Patel reviewed his case and imaging and recommended to proceed with a core biopsy. If that biopsy is not diagnostic he will need ENT consult for excisional biopsy.   On 05/31/2024, core needle biopsy of left neck lymph node showed squamous cell carcinoma.  Immunostains were positive for p40.  p16 showed strong positivity.  Clinical picture consistent with squamous and carcinoma of head and neck, unknown primary. Suspect maybe small left GT sulcus primary.   Dr. Tobie is planning to proceed with diagnostic laryngoscopy and biopsy with possible tonsillectomy, currently scheduled on 06/14/2024. If primary found, can consider TORS +/- Rads v/s CRT. He's leaning towards surgery.  Family history is significant for cancer, with his father having died of a malignant brain tumor and his sister having died from breast cancer that metastasized to the bone. Patient is retired, previously worked with metals in a factory setting.  He smoked for approx 15 years 2 ppd however estimates he quit 35 years ago. He does not drink alcohol. Last colonoscopy was in 11/2015 and PSA was checked 09/2023 with normal value of 1.44.  Plan to reevaluate him after above workup with Dr. Tobie.   INTERVAL HISTORY:  Discussed the use of AI scribe software for clinical note transcription with the patient, who gave verbal consent to proceed.  History of Present Illness DEVANTAE BABE is a 73 year old male who presents with a positive lymph node biopsy for squamous cell carcinoma. He is accompanied by his daughter, Arland.  He recently underwent a lymph node biopsy that confirmed squamous cell carcinoma. A PET scan conducted three weeks ago did not reveal any definitive primary lesion. The scan showed a 3.5 cm lymph node without other significant findings.  He experiences discomfort in the neck area, particularly after the biopsy, which caused swelling. No pain when swallowing, but he is aware of the lymph node's presence.  He has a history of colon surgery due to a perforation, which required a temporary colostomy bag for seven months, later reversed. He denies any prior symptoms leading to the surgery and is unsure of the exact cause, though diverticulosis was mentioned.  He has diabetes, which is relevant to his upcoming surgical procedures, as he is concerned about fasting requirements due to his diabetes management.   MEDICAL HISTORY:  Past Medical History:  Diagnosis Date   GERD (gastroesophageal reflux disease)    Hyperlipidemia    Hypertension  SURGICAL HISTORY: Past Surgical History:  Procedure Laterality Date   COLON SURGERY     COLONOSCOPY WITH PROPOFOL  N/A 12/02/2015   Procedure: COLONOSCOPY WITH PROPOFOL ;  Surgeon: Gladis MARLA Louder, MD;  Location: [facility];  Service: Endoscopy;  Laterality: N/A;   IR US  GUIDE BX ASP/DRAIN  05/31/2024    SOCIAL HISTORY: He reports that he quit smoking about 28 years ago. His  smoking use included cigarettes. He has never used smokeless tobacco. He reports that he does not drink alcohol and does not use drugs. Social History   Socioeconomic History   Marital status: Married    Spouse name: Not on file   Number of children: Not on file   Years of education: Not on file   Highest education level: Not on file  Occupational History   Not on file  Tobacco Use   Smoking status: Former    Current packs/day: 0.00    Types: Cigarettes    Quit date: 06/10/1996    Years since quitting: 28.0   Smokeless tobacco: Never  Vaping Use   Vaping status: Never Used  Substance and Sexual Activity   Alcohol use: No    Alcohol/week: 0.0 standard drinks of alcohol   Drug use: No   Sexual activity: Not on file  Other Topics Concern   Not on file  Social History Narrative   Not on file   Social Drivers of Health   Financial Resource Strain: Not on file  Food Insecurity: No Food Insecurity (06/08/2024)   Hunger Vital Sign    Worried About Running Out of Food in the Last Year: Never true    Ran Out of Food in the Last Year: Never true  Transportation Needs: No Transportation Needs (06/08/2024)   PRAPARE - Administrator, Civil Service (Medical): No    Lack of Transportation (Non-Medical): No  Physical Activity: Not on file  Stress: Not on file  Social Connections: Not on file  Intimate Partner Violence: Not At Risk (06/08/2024)   Humiliation, Afraid, Rape, and Kick questionnaire    Fear of Current or Ex-Partner: No    Emotionally Abused: No    Physically Abused: No    Sexually Abused: No    FAMILY HISTORY: Family History  Problem Relation Age of Onset   Heart disease Mother    Stroke Mother    Cancer Father    Cancer Sister    COPD Brother    Heart disease Sister    COPD Sister     ALLERGIES:  He is allergic to lisinopril and ozempic (0.25 or 0.5 mg-dose) [semaglutide(0.25 or 0.5mg -dos)].  MEDICATIONS:  Current Outpatient Medications   Medication Sig Dispense Refill   acetaminophen  (TYLENOL ) 325 MG tablet Take 1-2 tablets (325-650 mg total) by mouth every 4 (four) hours as needed for mild pain.     albuterol  (VENTOLIN  HFA) 108 (90 Base) MCG/ACT inhaler Inhale 1-2 puffs into the lungs every 6 (six) hours as needed for wheezing or shortness of breath.     aspirin EC 81 MG tablet 1 tablet Orally Once a day; Duration: 30 day(s)     atorvastatin  (LIPITOR) 20 MG tablet Take 20 mg by mouth daily.     cetirizine (ZYRTEC) 10 MG tablet Take 10 mg by mouth daily.     Cholecalciferol  (VITAMIN D3) 50 MCG (2000 UT) CHEW Chew 2,000 Units by mouth daily.     DULoxetine  (CYMBALTA ) 60 MG capsule Take 60 mg by mouth daily.  FARXIGA 10 MG TABS tablet Take 10 mg by mouth daily.     fluticasone  (FLONASE ) 50 MCG/ACT nasal spray Place 1 spray into both nostrils daily.     HYDROcodone  bit-homatropine (HYCODAN) 5-1.5 MG/5ML syrup Take 5 mLs by mouth every 6 (six) hours as needed for cough. 120 mL 0   ibuprofen (ADVIL) 200 MG tablet Take 200 mg by mouth every 6 (six) hours as needed for mild pain.     losartan  (COZAAR ) 50 MG tablet Take 50 mg by mouth daily.     metFORMIN (GLUCOPHAGE-XR) 500 MG 24 hr tablet Take 1,000 mg by mouth 2 (two) times daily.     omeprazole  (PRILOSEC ) 20 MG capsule Take 20 mg by mouth daily.     tadalafil (CIALIS) 20 MG tablet Take 20 mg by mouth as needed.     No current facility-administered medications for this visit.    REVIEW OF SYSTEMS:    Review of Systems - Oncology  All other pertinent systems were reviewed with the patient and are negative.  PHYSICAL EXAMINATION:   Onc Performance Status - 06/09/24 1000       ECOG Perf Status   ECOG Perf Status Restricted in physically strenuous activity but ambulatory and able to carry out work of a light or sedentary nature, e.g., light house work, office work      KPS SCALE   KPS % SCORE Able to carry on normal activity, minor s/s of disease          Vitals:    06/09/24 1044  BP: 116/78  Pulse: 81  Resp: 17  Temp: 97.9 F (36.6 C)  SpO2: 99%   Filed Weights   06/09/24 1044  Weight: 222 lb 9.6 oz (101 kg)    Physical Exam Constitutional:      General: He is not in acute distress.    Appearance: Normal appearance.  HENT:     Head: Normocephalic and atraumatic.     Mouth/Throat:     Comments: No obvious abnormalities noted in the visualized portion of oral cavity Eyes:     Conjunctiva/sclera: Conjunctivae normal.  Cardiovascular:     Rate and Rhythm: Normal rate and regular rhythm.  Pulmonary:     Effort: Pulmonary effort is normal. No respiratory distress.  Abdominal:     General: There is no distension.  Lymphadenopathy:     Cervical: Cervical adenopathy (left level 2, ~ 3 cm, relatively fixed at the angle of jaw) present.  Neurological:     General: No focal deficit present.     Mental Status: He is alert and oriented to person, place, and time.  Psychiatric:        Mood and Affect: Mood normal.        Behavior: Behavior normal.      LABORATORY DATA:   I have reviewed the data as listed.   Lab Results  Component Value Date   WBC 7.4 05/22/2024   HGB 15.2 05/22/2024   HCT 44.8 05/22/2024   MCV 90.0 05/22/2024   PLT 244 05/22/2024   Recent Labs    05/22/24 1341  NA 140  K 3.9  CL 104  CO2 30  GLUCOSE 158*  BUN 11  CREATININE 0.99  CALCIUM  9.6  GFRNONAA >60  PROT 7.4  ALBUMIN 4.4  AST 30  ALT 18  ALKPHOS 73  BILITOT 0.6     RADIOGRAPHIC STUDIES:  I have personally reviewed the radiological images as listed and agree with the findings  in the report.  IR US  Guide Bx Asp/Drain Result Date: 05/31/2024 CLINICAL DATA:  Left-sided neck mass EXAM: US  IMAGE GUIDED percutaneous biopsy COMPARISON:  None Available. FINDINGS: The left neck was evaluated with ultrasound and demonstrates a large soft tissue mass approximately 1 cm deep to skin and corresponding with the FDG avid Mass identified on the  05/19/2024 PET scan. The skin was prepped and draped in usual sterile fashion using chlorhexidine. Local anesthesia was achieved with 1% lidocaine  by infiltrating subcutaneous tissue prior to the biopsy. A small incision was made. Biopsy device was then advanced through the small incision under ultrasound guidance until the tip of the needle was identified within the proximal edge of the lesion. A 2.3 cm throw was chosen on the biopsy needle and a sample was obtained measuring the length. Sample was placed in formalin and then a second sample of the same length was obtained. Post biopsy images demonstrate no hemorrhage. Sterile dressing applied. IMPRESSION: Satisfactory left neck core needle biopsy using an 18 gauge BioPince needle with ultrasound guidance. Electronically Signed   By: Cordella Banner   On: 05/31/2024 13:32   NM PET Image Initial (PI) Skull Base To Thigh (F-18 FDG) Result Date: 05/19/2024 EXAM: PET AND CT SKULL BASE TO MID THIGH 05/19/2024 08:38:14 AM TECHNIQUE: RADIOPHARMACEUTICAL: 11.0 mCi F-18 FDG Uptake time 60 minutes. Glucose level  mg/dl. PET imaging was acquired from the base of the skull to the mid thighs. Non-contrast enhanced computed tomography was obtained for attenuation correction and anatomic localization. COMPARISON: Neck CT 05/08/2024 CLINICAL HISTORY: Cervical lymphadenopathy. FINDINGS: HEAD AND NECK: Oval shaped soft tissue mass in the left neck at the left level IIA nodal position measures 2.8 x 3.5 cm on image 32 series 4 with maximum SUV 14.1, favoring malignancy. No other focal hypermetabolic activity is identified in the neck region to indicate a separate primary site. CHEST: Thoracic aortic, coronary artery, and branch vessel atheromatous vascular calcifications. No metabolically active pulmonary nodules. No metabolically active lymphadenopathy. ABDOMEN AND PELVIS: Presumed physiologic activity in bowel. Physiologic activity within the genitourinary systems.  Cholelithiasis. Systemic atherosclerosis is present, including the aorta and iliac arteries. No metabolically active intraperitoneal mass. No metabolically active lymphadenopathy. BONES AND SOFT TISSUE: No abnormal FDG activity localizes to the bones. No metabolically active aggressive osseous lesion. IMPRESSION: 1. Hypermetabolic left level IIA neck mass/node (2.8 x 3.5 cm, SUV max 14.1), suspicious for malignancy. 2. No additional hypermetabolic focus in the neck to suggest a separate primary site. 3. Systemic atherosclerosis, including thoracic aorta, coronary arteries, and iliac arteries. 4. Cholelithiasis. Electronically signed by: Ryan Salvage MD 05/19/2024 08:55 AM EDT RP Workstation: [id number]    No orders of the defined types were placed in this encounter.   CODE STATUS:  Code Status History     Date Active Date Inactive Code Status Order ID Comments User Context   10/29/2021 1258 10/31/2021 1704 Full Code [id number]  Rockey Denece LABOR, DO Inpatient       Future Appointments  Date Time Provider Department Center  06/22/2024  4:30 PM Tobie Eldora NOVAK, MD [facility] None     I spent a total of 70 minutes during this encounter with the patient including review of chart and various tests results, discussions about plan of care and coordination of care plan.  This document was completed utilizing speech recognition software. Grammatical errors, random word insertions, pronoun errors, and incomplete sentences are an occasional consequence of this system due to software limitations, ambient noise, and hardware  issues. Any formal questions or concerns about the content, text or information contained within the body of this dictation should be directly addressed to the provider for clarification.

## 2024-06-11 ENCOUNTER — Encounter: Payer: Self-pay | Admitting: Radiation Oncology

## 2024-06-11 DIAGNOSIS — C760 Malignant neoplasm of head, face and neck: Secondary | ICD-10-CM | POA: Insufficient documentation

## 2024-06-11 NOTE — Progress Notes (Signed)
 Dental Form with Estimates of Radiation Dose  Howard Simmons Date of birth: 12-31-1950           Diagnosis:    ICD-10-CM   1. Squamous cell carcinoma of head and neck region  C44.42     2. Head and neck cancer (HCC)  C76.0        Cancer Staging  Head and neck cancer (HCC) Staging form: Pharynx - HPV-Mediated Oropharynx, AJCC 8th Edition - Clinical stage from 06/09/2024: Stage I (cT0, cN1, cM0, p16+) - Signed by Izell Domino, MD on 06/11/2024 Stage prefix: Initial diagnosis   Prognosis: curative  Anticipated # of fractions: 30-35    Daily?: yes  # of weeks of radiotherapy: 6-7  Chemotherapy?: possibly  Anticipated xerostomia:  Mild permanent    Pre-simulation needs:   Scatter protection and dental advice  Simulation: ASAP - may have surgery before RT  Other Notes:   Please contact Domino Izell, MD, with patient's disposition after evaluation and/or dental treatment. -----------------------------------  Domino Izell, MD

## 2024-06-12 ENCOUNTER — Other Ambulatory Visit (HOSPITAL_COMMUNITY)

## 2024-06-12 ENCOUNTER — Encounter (HOSPITAL_COMMUNITY): Payer: Self-pay

## 2024-06-12 ENCOUNTER — Encounter (HOSPITAL_COMMUNITY): Payer: Self-pay | Admitting: *Deleted

## 2024-06-13 ENCOUNTER — Encounter (HOSPITAL_COMMUNITY): Payer: Self-pay | Admitting: *Deleted

## 2024-06-13 ENCOUNTER — Other Ambulatory Visit: Payer: Self-pay

## 2024-06-13 LAB — SURGICAL PATHOLOGY

## 2024-06-13 NOTE — Progress Notes (Signed)
 PCP - Dr Montie Pizza Cardiologist - none Oncology - Dr Chinita Pasam  Chest x-ray - n/a EKG - DOS Stress Test - n/a ECHO - n/a Cardiac Cath - n/a  ICD Pacemaker/Loop - n/a  Sleep Study -  Yes (03/2006) CPAP - mild, does not use CPAP  Diabetes Type 2, does not check blood sugar  Hold Farxiga 3 days prior procedure. Last dose of Farxiga was on 06/13/24 (SDW Call)  Do not take Metformin on the morning of surgery.  Blood Thinner Instructions:  n/a  Aspirin Instructions: Wife Howard Simmons to call MD instructions  NPO  Anesthesia review: Yes  STOP now taking any Aspirin (unless otherwise instructed by your surgeon), Aleve, Naproxen, Ibuprofen, Motrin, Advil, Goody's, BC's, all herbal medications, fish oil, and all vitamins.   Coronavirus Screening Do you have any of the following symptoms:  Cough yes/no: No Fever (>100.61F)  yes/no: No Runny nose yes/no: No Sore throat yes/no: No Difficulty breathing/shortness of breath  yes/no: No  Have you traveled in the last 14 days and where? yes/no: No  Patient/wIfe Howard Simmons verbalized understanding of instructions that were given via speaker phone.

## 2024-06-13 NOTE — Progress Notes (Signed)
 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 On 04/13/2024 he was prescribed amoxicillin for swollen lymph node in left submandibular region. Lymphadenopathy persisted, so CT neck done in September showing a 4.5 x 3.4 cm lesion in the left upper neck most suspicious for metastatic node over primary mass. FNA 05/31/2024 showed squamous cell carcinoma. He was referred to ENT Dr. Tobie who suspected possibly small left GT sulcus primary. Above procedure planned.   Other history includes former smoker (quit 06/10/1996), HTN, HLD, GERD, DM2, OSA (mild, no CPAP), COPD, left neck squamous cell carcinoma (05/31/2024), diverticulitis (s/p left colon resection for perforated diverticulitis 2001).  Transnasal fiberoptic laryngoscopy by Dr. Tobie on 06/07/2024: Findings: The nasal cavity and nasopharynx did not reveal any masses or lesions, mucosa appeared to be without obvious lesions. The tongue base, pharyngeal walls, piriform sinuses, vallecula, epiglottis and postcricoid region are normal in appearance EXCEPT - slightly more erythematous, potentially raised area left GT sulcus The visualized portion of the subglottis and proximal trachea is widely patent. The vocal folds are mobile bilaterally. There are no lesions on the free edge of the vocal folds nor elsewhere in the larynx worrisome for malignancy.    He had CBC and CMP drawn on 05/22/2024 which were normal other than glucose of 158.  A1c 6.7% on 04/14/2024 [facility]).  He had preoperative RN phone call on 06/13/2024.  He had not received any Farxiga instructions  so last dose 06/13/2024.  Anesthesia team to evaluate on the day of surgery.  VS:  Wt Readings from Last 3 Encounters:  06/08/24 100.4 kg  06/09/24 101 kg  06/07/24 100.7 kg   BP Readings from Last 3 Encounters:  06/08/24 134/76  06/09/24 116/78  06/07/24 129/79   Pulse Readings from Last 3 Encounters:  06/08/24 91  06/09/24 81  06/07/24 86     PROVIDERS: Teresa Channel, MD is PCP  Autumn Millman, MD is HEM-ONC Izell Domino, MD is RAD-ONC   LABS: See DISCUSSION.  IMAGES: PET Scan 05/19/2024: IMPRESSION: 1. Hypermetabolic left level IIA neck mass/node (2.8 x 3.5 cm, SUV max 14.1), suspicious for malignancy. 2. No additional hypermetabolic focus in the neck to suggest a separate primary site. 3. Systemic atherosclerosis, including thoracic aorta, coronary arteries, and iliac arteries. 4. Cholelithiasis.  CT Soft Tissue Neck 05/08/2024: IMPRESSION: 1. 4.5 x 3.4 cm ovoid soft lesion within the left upper neck (at the level 2 station). This is most suspicious for an abnormal, enlarged lymph node (such as from nodal metastatic disease). A primary mass is possible, but not favored. Direct tissue sampling should be considered. Additionally, consider a whole-body PET-CT to assess for a primary mass and abnormal lymph nodes elsewhere. 2. The soft tissue lesion completely effaces the left internal jugular vein within the mid/upper neck. 3. Additional non-acute findings as described within the body of the report.   EKG: As indicated on the day of surgery. Last EKG noted from 10/30/2021 showed: Sinus tachycardia at 104 bpm Inferior infarct, old Confirmed by Bari Pfeiffer ([id number]) on 10/29/2021 4:49:51  AM    CV: N/A  Past Medical History:  Diagnosis Date   Anxiety 04/13/2024   Cancer (HCC) 05/2024   squamous cell of head and neck region, unknown primary   COPD (chronic obstructive pulmonary disease) (HCC) 04/07/2022   Depression 04/28/2024   Diabetes mellitus  without complication (HCC) 01/13/2024   type 2   ED (erectile dysfunction)    on cialis   GERD (gastroesophageal reflux disease)    Hyperlipidemia    Hypertension    Pneumonia 10/29/2021   right lower lobe   Seasonal allergies    Sleep apnea    mild , does not use cpap    Past Surgical History:  Procedure Laterality Date   COLON SURGERY     COLONOSCOPY WITH PROPOFOL  N/A 12/02/2015   Procedure: COLONOSCOPY WITH PROPOFOL ;  Surgeon: Gladis MARLA Louder, MD;  Location: [facility];  Service: Endoscopy;  Laterality: N/A;   EYE SURGERY Bilateral    cataracts   IR US  GUIDE BX ASP/DRAIN  05/31/2024    MEDICATIONS: No current facility-administered medications for this encounter.    acetaminophen  (TYLENOL ) 325 MG tablet   albuterol  (VENTOLIN  HFA) 108 (90 Base) MCG/ACT inhaler   aspirin EC 81 MG tablet   atorvastatin  (LIPITOR) 20 MG tablet   cetirizine (ZYRTEC) 10 MG tablet   Cholecalciferol  (VITAMIN D3) 50 MCG (2000 UT) CHEW   DULoxetine  (CYMBALTA ) 60 MG capsule   FARXIGA 10 MG TABS tablet   fluticasone  (FLONASE ) 50 MCG/ACT nasal spray   ibuprofen (ADVIL) 200 MG tablet   losartan  (COZAAR ) 50 MG tablet   metFORMIN (GLUCOPHAGE-XR) 500 MG 24 hr tablet   omeprazole  (PRILOSEC ) 20 MG capsule   tadalafil (CIALIS) 20 MG tablet  Patient/wife to contact Dr. Tobie for perioperative ASA instructions.   Isaiah Ruder, PA-C Surgical Short Stay/Anesthesiology [facility] Phone [phone] [facility] Phone [phone] 06/13/2024 12:55 PM

## 2024-06-13 NOTE — Addendum Note (Signed)
 Encounter addended by: Izell Domino, MD on: 06/13/2024 7:55 AM  Actions taken: Clinical Note Signed

## 2024-06-13 NOTE — Progress Notes (Signed)
 Patient was called to be informed that the surgery time for tomorrow was changed to 15:00 o'clock. Patient was instructed to be at the hospital at 12:30 o'clock. Patient and his wife verbalized understanding.

## 2024-06-13 NOTE — Anesthesia Preprocedure Evaluation (Signed)
 Anesthesia Evaluation  Patient identified by MRN, date of birth, ID band Patient awake    Reviewed: Allergy & Precautions, NPO status , Patient's Chart, lab work & pertinent test results  Airway Mallampati: I  TM Distance: <3 FB Neck ROM: Limited    Dental  (+) Teeth Intact, Dental Advisory Given   Pulmonary sleep apnea and Continuous Positive Airway Pressure Ventilation , COPD, former smoker   Pulmonary exam normal        Cardiovascular hypertension, Pt. on medications Normal cardiovascular exam     Neuro/Psych  PSYCHIATRIC DISORDERS Anxiety Depression    negative neurological ROS     GI/Hepatic Neg liver ROS,GERD  Medicated,,  Endo/Other  diabetes, Type 2, Oral Hypoglycemic Agents    Renal/GU negative Renal ROS     Musculoskeletal  (+) Arthritis ,    Abdominal   Peds  Hematology negative hematology ROS (+)   Anesthesia Other Findings   Reproductive/Obstetrics                              Anesthesia Physical Anesthesia Plan  ASA: 3  Anesthesia Plan: General   Post-op Pain Management: Tylenol  PO (pre-op)* and Toradol IV (intra-op)*   Induction: Intravenous  PONV Risk Score and Plan: 3 and Ondansetron  and Midazolam  Airway Management Planned: Oral ETT  Additional Equipment: None  Intra-op Plan:   Post-operative Plan: Extubation in OR  Informed Consent: I have reviewed the patients History and Physical, chart, labs and discussed the procedure including the risks, benefits and alternatives for the proposed anesthesia with the patient or authorized representative who has indicated his/her understanding and acceptance.     Dental advisory given  Plan Discussed with: CRNA  Anesthesia Plan Comments: (PAT note written 06/13/2024 by Jarrod Bodkins, PA-C.  )         Anesthesia Quick Evaluation

## 2024-06-14 ENCOUNTER — Encounter (HOSPITAL_COMMUNITY): Payer: Self-pay

## 2024-06-14 ENCOUNTER — Ambulatory Visit (HOSPITAL_COMMUNITY): Payer: Self-pay | Admitting: Vascular Surgery

## 2024-06-14 ENCOUNTER — Other Ambulatory Visit: Payer: Self-pay

## 2024-06-14 ENCOUNTER — Ambulatory Visit (HOSPITAL_COMMUNITY)
Admission: RE | Admit: 2024-06-14 | Discharge: 2024-06-14 | Disposition: A | Discharge status: Home / Self Care | Attending: Otolaryngology | Admitting: Otolaryngology

## 2024-06-14 ENCOUNTER — Encounter (HOSPITAL_COMMUNITY): Admission: RE | Disposition: A | Payer: Self-pay | Source: Home / Self Care | Attending: Otolaryngology

## 2024-06-14 ENCOUNTER — Ambulatory Visit (HOSPITAL_BASED_OUTPATIENT_CLINIC_OR_DEPARTMENT_OTHER): Payer: Self-pay | Admitting: Vascular Surgery

## 2024-06-14 DIAGNOSIS — F419 Anxiety disorder, unspecified: Secondary | ICD-10-CM | POA: Insufficient documentation

## 2024-06-14 DIAGNOSIS — Z87891 Personal history of nicotine dependence: Secondary | ICD-10-CM | POA: Insufficient documentation

## 2024-06-14 DIAGNOSIS — I10 Essential (primary) hypertension: Secondary | ICD-10-CM

## 2024-06-14 DIAGNOSIS — C760 Malignant neoplasm of head, face and neck: Secondary | ICD-10-CM | POA: Insufficient documentation

## 2024-06-14 DIAGNOSIS — K219 Gastro-esophageal reflux disease without esophagitis: Secondary | ICD-10-CM | POA: Diagnosis not present

## 2024-06-14 DIAGNOSIS — G4730 Sleep apnea, unspecified: Secondary | ICD-10-CM | POA: Insufficient documentation

## 2024-06-14 DIAGNOSIS — E785 Hyperlipidemia, unspecified: Secondary | ICD-10-CM | POA: Insufficient documentation

## 2024-06-14 DIAGNOSIS — F32A Depression, unspecified: Secondary | ICD-10-CM | POA: Diagnosis not present

## 2024-06-14 DIAGNOSIS — Z7984 Long term (current) use of oral hypoglycemic drugs: Secondary | ICD-10-CM | POA: Diagnosis not present

## 2024-06-14 DIAGNOSIS — A4289 Other forms of actinomycosis: Secondary | ICD-10-CM | POA: Diagnosis not present

## 2024-06-14 DIAGNOSIS — E119 Type 2 diabetes mellitus without complications: Secondary | ICD-10-CM | POA: Diagnosis not present

## 2024-06-14 DIAGNOSIS — C4442 Squamous cell carcinoma of skin of scalp and neck: Secondary | ICD-10-CM

## 2024-06-14 DIAGNOSIS — J449 Chronic obstructive pulmonary disease, unspecified: Secondary | ICD-10-CM | POA: Insufficient documentation

## 2024-06-14 HISTORY — DX: Sleep apnea, unspecified: G47.30

## 2024-06-14 HISTORY — PX: DIRECT LARYNGOSCOPY: SHX5326

## 2024-06-14 HISTORY — PX: TONSILLECTOMY: SHX5217

## 2024-06-14 HISTORY — DX: Male erectile dysfunction, unspecified: N52.9

## 2024-06-14 HISTORY — DX: Other seasonal allergic rhinitis: J30.2

## 2024-06-14 HISTORY — DX: Type 2 diabetes mellitus without complications: E11.9

## 2024-06-14 LAB — GLUCOSE, CAPILLARY
Glucose-Capillary: 134 mg/dL — ABNORMAL HIGH (ref 70–99)
Glucose-Capillary: 97 mg/dL (ref 70–99)
Glucose-Capillary: 159 mg/dL — ABNORMAL HIGH (ref 70–99)

## 2024-06-14 SURGERY — LARYNGOSCOPY, DIRECT
Anesthesia: General | Site: Throat

## 2024-06-14 MED ORDER — MIDAZOLAM HCL 2 MG/2ML IJ SOLN
INTRAMUSCULAR | Status: AC
  Filled 2024-06-14: qty 2

## 2024-06-14 MED ORDER — ACETAMINOPHEN 500 MG PO TABS
ORAL_TABLET | ORAL | Status: AC
  Filled 2024-06-14: qty 2

## 2024-06-14 MED ORDER — LACTATED RINGERS IV SOLN: INTRAVENOUS | Status: DC

## 2024-06-14 MED ORDER — ORAL CARE MOUTH RINSE: 15.0000 mL | Freq: Once | OROMUCOSAL | Status: AC

## 2024-06-14 MED ORDER — OXYMETAZOLINE HCL 0.05 % NA SOLN
NASAL | Status: AC
  Filled 2024-06-14: qty 30

## 2024-06-14 MED ORDER — PROPOFOL 10 MG/ML IV BOLUS
INTRAVENOUS | Status: AC
  Filled 2024-06-14: qty 20

## 2024-06-14 MED ORDER — ONDANSETRON HCL 4 MG/2ML IJ SOLN
INTRAMUSCULAR | Status: DC | PRN
  Administered 2024-06-14: 4 mg via INTRAVENOUS

## 2024-06-14 MED ORDER — LIDOCAINE 2% (20 MG/ML) 5 ML SYRINGE
INTRAMUSCULAR | Status: DC | PRN
  Administered 2024-06-14: 40 mg via INTRAVENOUS

## 2024-06-14 MED ORDER — MIDAZOLAM HCL (PF) 2 MG/2ML IJ SOLN
INTRAMUSCULAR | Status: DC | PRN
Start: 2024-06-14 — End: 2024-06-14
  Administered 2024-06-14 (×2): 1 mg via INTRAVENOUS

## 2024-06-14 MED ORDER — FENTANYL CITRATE (PF) 100 MCG/2ML IJ SOLN
INTRAMUSCULAR | Status: AC
  Filled 2024-06-14: qty 2

## 2024-06-14 MED ORDER — ROCURONIUM BROMIDE 10 MG/ML (PF) SYRINGE
PREFILLED_SYRINGE | INTRAVENOUS | Status: AC
  Filled 2024-06-14: qty 10

## 2024-06-14 MED ORDER — OXYCODONE HCL 5 MG PO TABS
5.0000 mg | ORAL_TABLET | Freq: Once | ORAL | Status: AC | PRN
  Administered 2024-06-14: 5 mg via ORAL

## 2024-06-14 MED ORDER — IBUPROFEN 200 MG PO TABS: 400.0000 mg | ORAL_TABLET | Freq: Four times a day (QID) | ORAL | 0 refills | Status: AC | PRN

## 2024-06-14 MED ORDER — HYDROMORPHONE HCL 1 MG/ML IJ SOLN
INTRAMUSCULAR | Status: AC
  Filled 2024-06-14: qty 1

## 2024-06-14 MED ORDER — EPINEPHRINE HCL (NASAL) 0.1 % NA SOLN
NASAL | Status: AC
  Filled 2024-06-14: qty 30

## 2024-06-14 MED ORDER — ACETAMINOPHEN 10 MG/ML IV SOLN
INTRAVENOUS | Status: DC | PRN
Start: 2024-06-14 — End: 2024-06-14
  Administered 2024-06-14: 1000 mg via INTRAVENOUS

## 2024-06-14 MED ORDER — CHLORHEXIDINE GLUCONATE 0.12 % MT SOLN
15.0000 mL | Freq: Once | OROMUCOSAL | Status: AC
Start: 2024-06-14 — End: 2024-06-14
  Administered 2024-06-14: 15 mL via OROMUCOSAL
  Filled 2024-06-14: qty 15

## 2024-06-14 MED ORDER — FENTANYL CITRATE (PF) 100 MCG/2ML IJ SOLN
INTRAMUSCULAR | Status: DC | PRN
  Administered 2024-06-14 (×2): 50 ug via INTRAVENOUS
  Administered 2024-06-14: 100 ug via INTRAVENOUS

## 2024-06-14 MED ORDER — PHENYLEPHRINE 80 MCG/ML (10ML) SYRINGE FOR IV PUSH (FOR BLOOD PRESSURE SUPPORT)
PREFILLED_SYRINGE | INTRAVENOUS | Status: AC
  Filled 2024-06-14: qty 10

## 2024-06-14 MED ORDER — ACETAMINOPHEN 10 MG/ML IV SOLN
INTRAVENOUS | Status: AC
  Filled 2024-06-14: qty 100

## 2024-06-14 MED ORDER — DROPERIDOL 2.5 MG/ML IJ SOLN
0.6250 mg | Freq: Once | INTRAMUSCULAR | Status: AC | PRN
  Administered 2024-06-14: 0.625 mg via INTRAVENOUS

## 2024-06-14 MED ORDER — SUGAMMADEX SODIUM 200 MG/2ML IV SOLN
INTRAVENOUS | Status: DC | PRN
Start: 2024-06-14 — End: 2024-06-14
  Administered 2024-06-14: 400 mg via INTRAVENOUS

## 2024-06-14 MED ORDER — ACETAMINOPHEN 500 MG PO TABS: 1000.0000 mg | ORAL_TABLET | Freq: Four times a day (QID) | ORAL | 2 refills | Status: AC

## 2024-06-14 MED ORDER — OXYCODONE HCL 5 MG/5ML PO SOLN: 5.0000 mg | Freq: Once | ORAL | Status: AC | PRN

## 2024-06-14 MED ORDER — OXYCODONE HCL 5 MG PO TABS
ORAL_TABLET | ORAL | Status: AC
  Filled 2024-06-14: qty 1

## 2024-06-14 MED ORDER — ACETAMINOPHEN 10 MG/ML IV SOLN: 1000.0000 mg | Freq: Once | INTRAVENOUS | Status: DC | PRN

## 2024-06-14 MED ORDER — PROPOFOL 10 MG/ML IV BOLUS
INTRAVENOUS | Status: DC | PRN
Start: 2024-06-14 — End: 2024-06-14
  Administered 2024-06-14: 30 mg via INTRAVENOUS
  Administered 2024-06-14: 130 mg via INTRAVENOUS
  Administered 2024-06-14: 125 ug/kg/min via INTRAVENOUS
  Administered 2024-06-14: 40 mg via INTRAVENOUS

## 2024-06-14 MED ORDER — HYDROMORPHONE HCL 1 MG/ML IJ SOLN
0.2500 mg | INTRAMUSCULAR | Status: DC | PRN
  Administered 2024-06-14 (×4): 0.5 mg via INTRAVENOUS

## 2024-06-14 MED ORDER — FLUORESCEIN SODIUM 1 MG OP STRP
ORAL_STRIP | OPHTHALMIC | Status: AC
Start: 2024-06-14 — End: 2024-06-14
  Filled 2024-06-14: qty 1

## 2024-06-14 MED ORDER — MEPERIDINE HCL 25 MG/ML IJ SOLN
6.2500 mg | INTRAMUSCULAR | Status: DC | PRN
  Filled 2024-06-14: qty 1

## 2024-06-14 MED ORDER — DROPERIDOL 2.5 MG/ML IJ SOLN
INTRAMUSCULAR | Status: AC
  Filled 2024-06-14: qty 2

## 2024-06-14 MED ORDER — OXYMETAZOLINE HCL 0.05 % NA SOLN
NASAL | Status: DC | PRN
  Administered 2024-06-14 (×2): 1 via TOPICAL

## 2024-06-14 MED ORDER — DEXAMETHASONE SOD PHOSPHATE PF 10 MG/ML IJ SOLN
INTRAMUSCULAR | Status: DC | PRN
  Administered 2024-06-14: 10 mg via INTRAVENOUS

## 2024-06-14 MED ORDER — DEXMEDETOMIDINE HCL IN NACL 80 MCG/20ML IV SOLN
INTRAVENOUS | Status: DC | PRN
  Administered 2024-06-14: 12 ug via INTRAVENOUS

## 2024-06-14 MED ORDER — OXYCODONE HCL 5 MG PO TABS: 5.0000 mg | ORAL_TABLET | ORAL | 0 refills | Status: DC | PRN

## 2024-06-14 MED ORDER — LIDOCAINE 2% (20 MG/ML) 5 ML SYRINGE
INTRAMUSCULAR | Status: AC
  Filled 2024-06-14: qty 5

## 2024-06-14 MED ORDER — 0.9 % SODIUM CHLORIDE (POUR BTL) OPTIME
TOPICAL | Status: DC | PRN
  Administered 2024-06-14: 1000 mL

## 2024-06-14 MED ORDER — ROCURONIUM BROMIDE 10 MG/ML (PF) SYRINGE
PREFILLED_SYRINGE | INTRAVENOUS | Status: DC | PRN
  Administered 2024-06-14: 60 mg via INTRAVENOUS
  Administered 2024-06-14 (×2): 20 mg via INTRAVENOUS

## 2024-06-14 MED ORDER — ONDANSETRON HCL 4 MG/2ML IJ SOLN
INTRAMUSCULAR | Status: AC
Start: 2024-06-14 — End: 2024-06-14
  Filled 2024-06-14: qty 2

## 2024-06-14 SURGICAL SUPPLY — 41 items
BAG COUNTER SPONGE SURGICOUNT (BAG) ×2 IMPLANT
BALLOON PULM 15 16.5 18X75 (BALLOONS) IMPLANT
CANISTER SUCTION 3000ML PPV (SUCTIONS) ×2 IMPLANT
CATH ROBINSON RED A/P 10FR (CATHETERS) IMPLANT
CLEANER TIP ELECTROSURG 2X2 (MISCELLANEOUS) ×2 IMPLANT
CNTNR URN SCR LID CUP LEK RST (MISCELLANEOUS) IMPLANT
COAGULATOR SUCT SWTCH 10FR 6 (ELECTROSURGICAL) ×2 IMPLANT
COVER BACK TABLE 60X90IN (DRAPES) ×2 IMPLANT
COVER MAYO STAND STRL (DRAPES) ×2 IMPLANT
DRAPE HALF SHEET 40X57 (DRAPES) ×2 IMPLANT
DRSG TELFA 3X8 NADH STRL (GAUZE/BANDAGES/DRESSINGS) ×2 IMPLANT
ELECT COATED BLADE 2.86 ST (ELECTRODE) ×2 IMPLANT
ELECTRODE REM PT RETRN 9FT PED (ELECTROSURGICAL) IMPLANT
ELECTRODE REM PT RTRN 9FT ADLT (ELECTROSURGICAL) IMPLANT
GAUZE 4X4 16PLY ~~LOC~~+RFID DBL (SPONGE) ×2 IMPLANT
GLOVE BIO SURGEON STRL SZ 6.5 (GLOVE) ×2 IMPLANT
GOWN STRL REUS W/ TWL LRG LVL3 (GOWN DISPOSABLE) ×4 IMPLANT
GUARD TEETH (MISCELLANEOUS) ×2 IMPLANT
KIT BASIN OR (CUSTOM PROCEDURE TRAY) ×2 IMPLANT
KIT TURNOVER KIT B (KITS) ×2 IMPLANT
NDL HYPO 25GX1X1/2 BEV (NEEDLE) IMPLANT
NDL TRANS ORAL INJECTION (NEEDLE) IMPLANT
NEEDLE HYPO 25GX1X1/2 BEV (NEEDLE) IMPLANT
NEEDLE TRANS ORAL INJECTION (NEEDLE) IMPLANT
PACK SRG BSC III STRL LF ECLPS (CUSTOM PROCEDURE TRAY) ×2 IMPLANT
PAD ARMBOARD POSITIONER FOAM (MISCELLANEOUS) ×4 IMPLANT
PATTIES SURGICAL .5X1.5 (GAUZE/BANDAGES/DRESSINGS) ×2 IMPLANT
PENCIL SMOKE EVACUATOR (MISCELLANEOUS) ×2 IMPLANT
POSITIONER HEAD DONUT 9IN (MISCELLANEOUS) ×2 IMPLANT
SOLN 0.9% NACL POUR BTL 1000ML (IV SOLUTION) ×2 IMPLANT
SOLUTION ANTFG W/FOAM PAD STRL (MISCELLANEOUS) IMPLANT
SPONGE SURGIFOAM ABS GEL 100 (HEMOSTASIS) IMPLANT
SPONGE TONSIL 1.25 RF SGL STRG (GAUZE/BANDAGES/DRESSINGS) ×2 IMPLANT
SURGILUBE 2OZ TUBE FLIPTOP (MISCELLANEOUS) IMPLANT
SUT VIC AB 3-0 PS2 18XBRD (SUTURE) IMPLANT
SUT VIC AB 3-0 SH 8-18 (SUTURE) IMPLANT
SYR BULB EAR ULCER 3OZ GRN STR (SYRINGE) ×2 IMPLANT
TOWEL GREEN STERILE FF (TOWEL DISPOSABLE) ×4 IMPLANT
TUBE CONNECTING 12X1/4 (SUCTIONS) ×2 IMPLANT
TUBE SALEM SUMP 16F (TUBING) ×2 IMPLANT
YANKAUER SUCT BULB TIP NO VENT (SUCTIONS) ×2 IMPLANT

## 2024-06-14 NOTE — Discharge Instructions (Addendum)
 Tonsillectomy Discharge Instructions (Dr. Tobie) ENT Office Contact Info: Otolaryngology Nursing Triage (Monday-Friday daytime working hours or for emergencies after hours) [phone]  Effects of Anesthesia Patients may be quite irritable for several hours after surgery. If sedatives were given, some patients will remain sleepy for much of the day. Nausea and vomiting is occasionally seen, and usually resolves by the evening of surgery - even without additional medications.  Medications Tonsillectomy is a painful procedure. Pain medications help but do not completely alleviate the discomfort.  For pain, ALTERNATE between Tylenol  and Motrin and give a dose every 3 hours (i.e. Tylenol  given at 12pm, then Motrin at 3pm then Tylenol  at 6pm). It is fine to use generic store brands instead of brand name. You do not need to wait to complain of pain to give them medication, scheduled dosing of medications will control the pain more effectively. Please take 1000 mg of Tylenol  and 400 mg of Ibuprofen every 6 hours. For children over 44 years of age or adults, we will also prescribe a narcotic pain medication - take 5 mg of oxycodone  every 4 hours. Please do not mix with any other narcotic medications or drive while taking narcotics. For adults, do not take more than 4000mg  Tylenol  over a 24 hour period. If you have liver disease, this amount should be less than 2000mg   DO NOT TAKE YOUR ASPIRIN for 10 days after surgery. Then you can resume  Activity  Vigorous exercise should be avoided for 14 days after surgery. Baths and showers are fine. Many patients have reduced energy levels until their pain decrease.  You should not travel out of the local area for a full 2 weeks after surgery in case you experience bleeding after surgery.   Eating & Drinking Dehydration is the biggest enemy in the recovery period. It will increase the pain, increase the risk of bleeding and delay the healing. It usually happens  because the pain of swallowing keeps the patient from drinking enough liquids. The only drinks to avoid are citrus like orange and grapefruit juices because they will burn the back of the throat. Some patients will have a small amount of liquid come out of their nose when they drink after surgery, this should stop within a few weeks after surgery. Although drinking is more important, eating is fine even the day of surgery but avoid foods that are crunchy or have sharp edges for 10 days. Dairy products may be taken, if desired. You should avoid acidic, salty and spicy foods (especially tomato sauces). Almost everyone loses some weight after tonsillectomy (which is usually regained in the 2nd or 3rd week after surgery).  Drinking is far more important that eating in the first 14 days after surgery, so concentrate on that first and foremost. Adequate liquid intake probably speeds recovery.  Other things.  If the tonsils and adenoids are very large, the patient's voice may change after surgery.  The recovery from tonsillectomy is a very painful period, often the worst pain people can recall, so please be understanding and patient with yourself, or the patient you are caring for. It is helpful to take pain medicine during the night if the patient awakens-- the worst pain is usually in the morning. The pain may seem to increase 2-5 days after surgery -this is normal when inflammation sets in. Please be aware that no combination of medicines will eliminate the pain - the patient will need to continue eating/drinking in spite of the remaining discomfort.  What should  we expect after surgery? As previously mentioned, most patients have a significant amount of pain after tonsillectomy, with pain resolving 7-14 days after surgery. Older children and adults seem to have more discomfort. Most patients can go home the day of surgery.  Ear pain: Many people will complain of earaches after tonsillectomy. This is normal and  should resolve. Give pain medications and encourage liquid intake.  Fever: Many patients have a low-grade fever after tonsillectomy - up to 101.5 degrees (380 C.) for several days. Higher prolonged fever should be reported to your surgeon.  Bad looking (and bad smelling) throat: After surgery, the place where the tonsils were removed is covered with a white film, which is a moist scab. This usually develops 3-5 days after surgery and falls off 10-14 days after surgery and usually causes bad breath. There will be some redness and swelling as well. The uvula (the part of the throat that hangs down in the middle between the tonsils) is usually swollen for several days after surgery.  Sore/bruised feeling of Tongue: This is common for the first few days after surgery because the tongue is pushed out of the way to take out the tonsils in surgery.  When should we call the doctor?  Nausea/Vomiting: This is a common side effect from General Anesthesia and can last up to 24-36 hours after surgery. Try giving sips of clear liquids like Sprite, water or apple juice then gradually increase fluid intake. If the nausea or vomiting continues beyond this time frame, call the doctor's office for medications that will help relieve the nausea and vomiting.  Bleeding: Significant bleeding is rare, but it happens to about 5% of patients who have tonsillectomy. It may come from the nose, the mouth, or be vomited or coughed up. Ice water mouthwashes may help stop or reduce bleeding. Please call our office or go to the ED for any significant bleeding from nose or mout.  Dehydration: If there has been little or no liquids intake for 24 hours, the patient may need to come to the hospital for IV fluids. Signs of dehydration include lethargy, the lack of tears when crying, and reduced or very concentrated urine output.  High Fever: If the patient has a consistent temperatures greater than 102, or when accompanied by cough or  difficulty breathing, you should call the doctor's office.

## 2024-06-14 NOTE — Transfer of Care (Signed)
 Immediate Anesthesia Transfer of Care Note  Patient: Howard Simmons  Procedure(s) Performed: LARYNGOSCOPY, DIRECT,  WITH BIOPSY (Throat) TONSILLECTOMY (Bilateral: Throat)  Patient Location: PACU  Anesthesia Type:General  Level of Consciousness: awake and drowsy  Airway & Oxygen Therapy: Patient Spontanous Breathing  Post-op Assessment: Report given to RN, Post -op Vital signs reviewed and stable, and Patient moving all extremities  Post vital signs: Reviewed and stable  Last Vitals:  Vitals Value Taken Time  BP 120/67 06/14/24 18:15  Temp 36.5 C 06/14/24 18:15  Pulse 83 06/14/24 18:16  Resp 13 06/14/24 18:16  SpO2 92 % 06/14/24 18:16  Vitals shown include unfiled device data.  Last Pain:  Vitals:   06/14/24 1311  TempSrc:   PainSc: 0-No pain         Complications: No notable events documented.

## 2024-06-14 NOTE — H&P (Signed)
 Pre-Operative H&P - Day Of Surgery Patient Name: Howard Simmons Date:   06/14/2024  HPI: Howard Simmons is a 73 y.o. male who presents today for operative treatment of squamous cell carcinoma of unknown primary of head and neck. Patient denies recent significant changes to health or significant new medications or physiologic change in condition which would immediately impact plans. No new types of therapy has been initiated that would change the plan or the appropriateness of the plan.   ROS:  A complete review of systems was obtained and is otherwise negative.   PMH:  Past Medical History:  Diagnosis Date   Anxiety 04/13/2024   Cancer (HCC) 05/2024   squamous cell of head and neck region, unknown primary   COPD (chronic obstructive pulmonary disease) (HCC) 04/07/2022   Depression 04/28/2024   Diabetes mellitus without complication (HCC) 01/13/2024   type 2   ED (erectile dysfunction)    on cialis   GERD (gastroesophageal reflux disease)    Hyperlipidemia    Hypertension    Pneumonia 10/29/2021   right lower lobe   Seasonal allergies    Sleep apnea    mild , does not use cpap    PSH:  Past Surgical History:  Procedure Laterality Date   COLON SURGERY     COLONOSCOPY WITH PROPOFOL  N/A 12/02/2015   Procedure: COLONOSCOPY WITH PROPOFOL ;  Surgeon: Gladis MARLA Louder, MD;  Location: [facility];  Service: Endoscopy;  Laterality: N/A;   EYE SURGERY Bilateral    cataracts   IR US  GUIDE BX ASP/DRAIN  05/31/2024    MEDS:   Current Facility-Administered Medications:    acetaminophen  (TYLENOL ) 500 MG tablet, , , ,    chlorhexidine (PERIDEX) 0.12 % solution 15 mL, 15 mL, Mouth/Throat, Once **OR** Oral care mouth rinse, 15 mL, Mouth Rinse, Once, Hollis, Kevin D, MD   lactated ringers  infusion, , Intravenous, Continuous, Hollis, Kevin D, MD  ALLERGIES: Lisinopril and Ozempic (0.25 or 0.5 mg-dose) [semaglutide(0.25 or 0.5mg -dos)]  EXAM: Vitals: BP 135/72 (BP Location: Right Arm)   Pulse  79   Temp 97.7 F (36.5 C) (Oral)   Resp 19   Ht 6' (1.829 m)   Wt 100.7 kg   SpO2 96%   BMI 30.11 kg/m   General Awake, at baseline alertness.   HEENT No scleral icterus or conjunctival hemorrhage. Globe position appears normal. External ears  normal. Nose patent without rhinorrhea. No lymphadenopathy. No thyromegaly  Cardiovascular No cyanosis.  Pulmonary No audible stridor. Breathing easily with no labor.  Neuro Symmetric facial movement.   Psychiatry Appropriate affect and mood.  Skin No scars or lesions on face or neck.  Extermities Moves all extremities with normal range of motion.   Other Findings None.   Assessment & Plan: Howard Simmons has diagnoses of squamous cell carcinoma of unknown primary of head and neck and will go to the OR today for direct laryngoscopy with biopsy, possible bilateral tonsillectomy. Informed consent was obtained and available in EMR today. All questions have been answered, and risks/benefits/alternatives of procedure as noted in the consent were discussed in a quiet area. Questions were invited and answered. The patient expressed understanding, provided consent and wished to proceed despite risks.  Howard Simmons Howard Simmons 06/14/2024 2:57 PM

## 2024-06-14 NOTE — Op Note (Signed)
 Otolaryngology Operative note  RAYKWON HOBBS Date/Time of Admission: 06/14/2024 12:26 PM  CSN: [id number];MRN:[id number]  DOB: [birth year] Age: 73 y.o. 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  Post-Op Diagnosis: Same  Procedure: Bilateral tonsillectomy age > 6 (CPT [phone]) Direct Laryngoscopy with Biopsy with use of operating telescope (CPT [phone])  Surgeon: Eldora Blanch, MD  Anesthesia type:  General  Anesthesiologist: Anesthesiologist: Tilford Franky BIRCH, MD; Leopoldo Bruckner, MD CRNA: Jerl Donald LABOR, CRNA   Staff: Circulator: Bari Birmingham, RN Relief Circulator: Gwenn Mardy SQUIBB, RN Relief Scrub: Perez-Vasquez, Tiffany Scrub Person: Jane Rona SHERRYLL Dannielle Grayce JONELLE Circulator Assistant: Primus Leita CHRISTELLA, RN; Bertrand Bethena CROME, RN  Implants: None  Specimens: ID Type Source Tests Collected by Time Destination  1 : left glossotonsillar sulcus Tissue PATH ENT biopsy SURGICAL PATHOLOGY Blanch Eldora NOVAK, MD 06/14/2024 1542   2 : #2 left glossotonsillar sulcus Tissue PATH ENT biopsy SURGICAL PATHOLOGY Blanch Eldora NOVAK, MD 06/14/2024 1613   3 : #3 left glossotonsillar sulcus Tissue PATH ENT biopsy SURGICAL PATHOLOGY Blanch Eldora NOVAK, MD 06/14/2024 1634   4 : left lingual tonsil Tissue PATH ENT biopsy SURGICAL PATHOLOGY Blanch Eldora NOVAK, MD 06/14/2024 1635   5 : left tonsil Tissue PATH ENT biopsy SURGICAL PATHOLOGY Blanch Eldora NOVAK, MD 06/14/2024 1658   6 : right tonsil Tissue PATH ENT biopsy SURGICAL PATHOLOGY Blanch Eldora NOVAK, MD 06/14/2024 1658     EBL:  100 mL  Drains: None  Post-op disposition and condition: PACU, hemodynamically stable  Findings:  Complications: None apparent  Indications and consent:  Howard Simmons is a 73 y.o. male with diagnoses above. The patient's options were discussed, including risks/benefits/alternatives for each option. Patient expressed understanding, and despite these risks,  consented and decided to proceed with above procedures. Informed consent was signed before proceeding.  Procedure: The patient was identified in the preoperative area, consent confirmed, transported to the operating suite. They were transferred to the operating room table and placed in a supine position. After induction of general endotracheal anesthesia and establishment of airway, a surgeon initiated time out was performed.  The bed was rotated 90 degrees. The patient was then padded and draped appropriately.     The oral cavity was first examined and no abnormalities were identified. We then proceeded with our direct laryngoscopy. A tooth guard was placed to protect the maxillary gingiva. The Dedo laryngoscope was inserted into the oral cavity and advanced distally. Examination of the oral cavity, oropharynx, hypopharynx and larynx was performed in a sequential manner at that time. It revealed findings above. The operating telescope (0 degree) was used to examine the areas described above and photos obtained. The left glossotonsillar sulcus was somewhat firm and this was biopsied with cup forceps x2 and frozen sections sent twice, all negative of carcinoma. Then, cup biopsies were used to obtain biopsies of lingual tonsil and specimen passed off the field, which did not appear firm or worrisome Hemostasis was achieved with afrin pledgets. The hypopharynx and oropharynx were suctioned. All instruments were removed.  Given lack of positive primary diagnosis, decision was then made to perform tonsillectomies. The mouth was held open in suspension using a Crowe-Davis mouth gag and Mayo stand. There was no evidence of submucosal cleft palate. The left tonsil was grasped using an Allis and removed in a capsular plane using the protected spatula tip Bovie (15 cut, 15 coag). The right tonsil was removed in a similar  fashion. Small areas of bleeding and visible blood vessels were coagulated to achieve hemostasis.  The operative field was then irrigated and meticulously checked for hemostasis. The Crowe-Davis was released and a flexible suction was used to remove stomach and oropharynx contents. The patient was resuspended and the tonsillar fossae were rechecked for hemostasis. There was no bleeding.   With the surgical portion of the procedure complete, all instrumentation was then removed from the operative field. The patient's skin was cleaned.  He was returned to the care of the anesthesia team.  He was then weaned from his anesthetic and transported to the PACU in stable condition.  Follow up: 1 week   Drue Harr B Rozanna Cormany

## 2024-06-14 NOTE — Anesthesia Procedure Notes (Signed)
 Procedure Name: Intubation Date/Time: 06/14/2024 3:31 PM  Performed by: Jerl Donald LABOR, CRNAPre-anesthesia Checklist: Patient identified, Emergency Drugs available, Suction available and Patient being monitored Patient Re-evaluated:Patient Re-evaluated prior to induction Oxygen Delivery Method: Circle System Utilized Preoxygenation: Pre-oxygenation with 100% oxygen Induction Type: IV induction Ventilation: Mask ventilation without difficulty and Oral airway inserted - appropriate to patient size Laryngoscope Size: Mac and 4 Grade View: Grade I Tube type: Oral Tube size: 6.0 mm Number of attempts: 1 Airway Equipment and Method: Stylet and Oral airway Placement Confirmation: ETT inserted through vocal cords under direct vision, positive ETCO2 and breath sounds checked- equal and bilateral Secured at: 22 cm Tube secured with: Tape Dental Injury: Teeth and Oropharynx as per pre-operative assessment

## 2024-06-15 ENCOUNTER — Encounter (HOSPITAL_COMMUNITY): Payer: Self-pay | Admitting: Otolaryngology

## 2024-06-16 ENCOUNTER — Telehealth (INDEPENDENT_AMBULATORY_CARE_PROVIDER_SITE_OTHER): Payer: Self-pay

## 2024-06-16 LAB — SURGICAL PATHOLOGY

## 2024-06-16 NOTE — Telephone Encounter (Signed)
 Pt wife called asking if you could call her back she had questions for you following the surgery he just had done

## 2024-06-19 DIAGNOSIS — K141 Geographic tongue: Secondary | ICD-10-CM | POA: Diagnosis not present

## 2024-06-19 DIAGNOSIS — R590 Localized enlarged lymph nodes: Secondary | ICD-10-CM | POA: Diagnosis not present

## 2024-06-19 DIAGNOSIS — Z91843 Risk for dental caries, high: Secondary | ICD-10-CM | POA: Diagnosis not present

## 2024-06-19 DIAGNOSIS — K08409 Partial loss of teeth, unspecified cause, unspecified class: Secondary | ICD-10-CM | POA: Diagnosis not present

## 2024-06-19 DIAGNOSIS — C760 Malignant neoplasm of head, face and neck: Secondary | ICD-10-CM | POA: Diagnosis not present

## 2024-06-19 DIAGNOSIS — K029 Dental caries, unspecified: Secondary | ICD-10-CM | POA: Diagnosis not present

## 2024-06-19 DIAGNOSIS — K140 Glossitis: Secondary | ICD-10-CM | POA: Diagnosis not present

## 2024-06-19 DIAGNOSIS — B370 Candidal stomatitis: Secondary | ICD-10-CM | POA: Diagnosis not present

## 2024-06-19 DIAGNOSIS — K121 Other forms of stomatitis: Secondary | ICD-10-CM | POA: Diagnosis not present

## 2024-06-20 ENCOUNTER — Other Ambulatory Visit (INDEPENDENT_AMBULATORY_CARE_PROVIDER_SITE_OTHER): Payer: Self-pay | Admitting: Physician Assistant

## 2024-06-20 ENCOUNTER — Telehealth (INDEPENDENT_AMBULATORY_CARE_PROVIDER_SITE_OTHER): Payer: Self-pay | Admitting: Otolaryngology

## 2024-06-20 ENCOUNTER — Telehealth (INDEPENDENT_AMBULATORY_CARE_PROVIDER_SITE_OTHER): Payer: Self-pay

## 2024-06-20 MED ORDER — OXYCODONE HCL 5 MG PO TABS: 5.0000 mg | ORAL_TABLET | Freq: Four times a day (QID) | ORAL | 0 refills | Status: AC | PRN

## 2024-06-20 NOTE — Progress Notes (Signed)
 Post op medications sent for ongoing post op pain

## 2024-06-20 NOTE — Telephone Encounter (Signed)
-----   Message from Nurse Delon HERO sent at 06/20/2024  1:52 PM EST ----- Regarding: RE: pain medicine question Thanks Dr. Tobie :) ----- Message ----- From: Tobie Eldora NOVAK, MD Sent: 06/20/2024   1:13 PM EST To: Delon LITTIE Jefferson, RN; Hasina Kreager A Kallan Bischoff,# Subject: RE: pain medicine question                     Erskin Delon, I will extend his medication -- refilled 5mg  oxycodone  for another 20 tablets. Would you mind letting them know? I'm seeing them in ~3 days anyway. He can take it every 4 hours as needed Thanks! KP ----- Message ----- From: Jefferson Delon LITTIE, RN Sent: 06/20/2024   8:47 AM EST To: Eldora NOVAK Tobie, MD Subject: pain medicine question                         Hi Dr. Tobie-  I just got a call from Mr. Snarski and his wife. He is having a lot of pain after his surgery and his dental appointment with Dr. Danial yesterday. It sounds like Dr. Danial prescribed nystatin for yeast for him to take. They are concerned about his pain level and ask if you are able to extend his pain medicine prescription and increase the dosage? I'm happy to let them know what you are willing to do.   Thanks, [employer]

## 2024-06-20 NOTE — Telephone Encounter (Signed)
Informed patient of provider's message.Patient verbalizes understanding.

## 2024-06-20 NOTE — Telephone Encounter (Signed)
-----   Message from Nurse Delon HERO sent at 06/20/2024  8:44 AM EST ----- Regarding: pain medicine question Hi Dr. Tobie-  I just got a call from Mr. Applegate and his wife. He is having a lot of pain after his surgery and his dental appointment with Dr. Danial yesterday. It sounds like Dr. Danial prescribed nystatin for yeast for him to take. They are concerned about his pain level and ask if you are able to extend his pain medicine prescription and increase the dosage? I'm happy to let them know what you are willing to do.   Thanks, [employer]

## 2024-06-20 NOTE — Telephone Encounter (Signed)
 Due to pain, refilled oxycodone . Howard Simmons B Nina Hoar

## 2024-06-22 ENCOUNTER — Ambulatory Visit (INDEPENDENT_AMBULATORY_CARE_PROVIDER_SITE_OTHER): Admitting: Otolaryngology

## 2024-06-22 DIAGNOSIS — C4442 Squamous cell carcinoma of skin of scalp and neck: Secondary | ICD-10-CM

## 2024-06-22 NOTE — Anesthesia Postprocedure Evaluation (Signed)
 Anesthesia Post Note  Patient: Howard Simmons  Procedure(s) Performed: LARYNGOSCOPY, DIRECT,  WITH BIOPSY (Throat) TONSILLECTOMY (Bilateral: Throat)     Patient location during evaluation: PACU Anesthesia Type: General Level of consciousness: awake and alert Pain management: pain level controlled Vital Signs Assessment: post-procedure vital signs reviewed and stable Respiratory status: spontaneous breathing, nonlabored ventilation and respiratory function stable Cardiovascular status: blood pressure returned to baseline and stable Postop Assessment: no apparent nausea or vomiting Anesthetic complications: no   No notable events documented.                 Arnette Driggs

## 2024-06-24 DIAGNOSIS — Z9089 Acquired absence of other organs: Secondary | ICD-10-CM | POA: Diagnosis not present

## 2024-06-24 DIAGNOSIS — J029 Acute pharyngitis, unspecified: Secondary | ICD-10-CM | POA: Diagnosis not present

## 2024-06-27 ENCOUNTER — Encounter (INDEPENDENT_AMBULATORY_CARE_PROVIDER_SITE_OTHER): Payer: Self-pay | Admitting: Otolaryngology

## 2024-06-27 ENCOUNTER — Ambulatory Visit (INDEPENDENT_AMBULATORY_CARE_PROVIDER_SITE_OTHER): Admitting: Otolaryngology

## 2024-06-27 VITALS — BP 101/69 | HR 107 | Ht 72.0 in | Wt 201.0 lb

## 2024-06-27 DIAGNOSIS — C4442 Squamous cell carcinoma of skin of scalp and neck: Secondary | ICD-10-CM

## 2024-06-28 ENCOUNTER — Telehealth (INDEPENDENT_AMBULATORY_CARE_PROVIDER_SITE_OTHER): Payer: Self-pay | Admitting: Otolaryngology

## 2024-06-28 DIAGNOSIS — C4442 Squamous cell carcinoma of skin of scalp and neck: Secondary | ICD-10-CM

## 2024-06-28 NOTE — Telephone Encounter (Signed)
 Called patient. He wishes to proceed with left neck dissection in hopes of avoiding chemotherapy. He understands the possibility of ENE although the node did appear to be mobile yesterday. He also understands that he may need Chemotherapy despite this and will need radiation post-op. We also discussed R/B/A for surgery, including pain, bleeding, infection, numbness, risks to major vascular structures and life threatening bleed, tongue or lip weakness, chyle leak, injury to vagus or phrenic nerve, change in speech or swallowing, need for further procedures or treatment. We also discussed post-op management and expectations including possible need for drain. She understands all of this and is anxious to proceed   Xayvion Shirah B Keina Mutch

## 2024-06-28 NOTE — Telephone Encounter (Signed)
 Mrs.Chiem called in wanting to know if they should keep the appt for 07/03/24 w/Dr.Sandhu. Pt is having a procedure on 11/19 w/[facility]. Pt's wife is requesting an update.

## 2024-06-28 NOTE — Progress Notes (Signed)
 Unable to reach patient. He has formal follow up next week so will see him then

## 2024-07-01 NOTE — Progress Notes (Signed)
 Dear Dr. Teresa, Here is my assessment for our mutual patient, Howard Simmons. Thank you for allowing me the opportunity to care for your patient. Please do not hesitate to contact me should you have any other questions. Sincerely, Dr. Eldora Blanch  Otolaryngology Clinic Note Referring provider: Dr. Teresa HPI:  Initial visit (05/2024) Discussed the use of AI scribe software for clinical note transcription with the patient, who gave verbal consent to proceed.  History of Present Illness Howard Simmons is a 73 year old male who presents for left neck mass/squamous cell carcinoma of left nek. He is accompanied by wife and daughter.  He has a neck mass, noted otherwise incidentally. Overall painless. He feels like it has grown. There is no significant pain at the mass, dysphagia, odynophagia, otalgia, weight loss, hemoptysis, or significant dyspnea, other neck masses, dysphonia. He was seen by oncology, underwent a CT scan, PET scan, and biopsy. Amoxicillin for five days seemed to reduce the size of mass slightly. He has a past smoking history of 10-15 years at two and a half packs per day, quitting 30 years ago.   --------------------------------------------------------- 06/27/2024 Now s/p targeted DL/Bx. We had a long discussion about his pathology and options again. Throat is very sore, but slowly improving. No bleeding. No change in size of neck mass subjectively   H&N Surgery: denies Personal or FHx of bleeding dz or anesthesia difficulty: no  GLP-1: no AP/AC: no  Tobacco: prior, quit (30 pack year)  Independent Review of Additional Tests or Records:  PET/CT and CT Neck independently interpreted: agree with read - left neck mass level 2; PET avid. Some asymmetry glossotonsillar sulcus; b/l tonsil stones(?); Unable to see any obviously apparent primary lesions; significant compressino left IJV IMPRESSION CT: 1. 4.5 x 3.4 cm ovoid soft lesion within the left upper neck (at the level 2  station). This is most suspicious for an abnormal, enlarged lymph node (such as from nodal metastatic disease). A primary mass is possible, but not favored. Direct tissue sampling should be considered. Additionally, consider a whole-body PET-CT to assess for a primary mass and abnormal lymph nodes elsewhere. 2. The soft tissue lesion completely effaces the left internal jugular vein within the mid/upper neck. 3. Additional non-acute findings as described within the body of the report. IMPRESSION PET: 1. Hypermetabolic left level IIA neck mass/node (2.8 x 3.5 cm, SUV max 14.1), suspicious for malignancy. 2. No additional hypermetabolic focus in the neck to suggest a separate primary site. Path (Left Neck Bx) - 05/31/2024: SCCa; p16+ CBC and CMP 05/22/2024: WBC 7.4; BUN/Cr 11/0.99 Kiatlyn Walisiewicz notes 05/22/2024: noted left LAD - reviewed CT/PET; rec core bx, refe to ENT Path  06/14/2024: all biopsies/tonsillectomy negative for carcinoma PMH/Meds/All/SocHx/FamHx/ROS:   Past Medical History:  Diagnosis Date   Anxiety 04/13/2024   Cancer (HCC) 05/2024   squamous cell of head and neck region, unknown primary   COPD (chronic obstructive pulmonary disease) (HCC) 04/07/2022   Depression 04/28/2024   Diabetes mellitus without complication (HCC) 01/13/2024   type 2   ED (erectile dysfunction)    on cialis   GERD (gastroesophageal reflux disease)    Hyperlipidemia    Hypertension    Pneumonia 10/29/2021   right lower lobe   Seasonal allergies    Sleep apnea    mild , does not use cpap     Past Surgical History:  Procedure Laterality Date   COLON SURGERY     COLONOSCOPY WITH PROPOFOL  N/A 12/02/2015   Procedure:  COLONOSCOPY WITH PROPOFOL ;  Surgeon: Gladis MARLA Louder, MD;  Location: [facility];  Service: Endoscopy;  Laterality: N/A;   DIRECT LARYNGOSCOPY N/A 06/14/2024   Procedure: LARYNGOSCOPY, DIRECT,  WITH BIOPSY;  Surgeon: Tobie Eldora NOVAK, MD;  Location: [facility] OR;  Service:  ENT;  Laterality: N/A;   EYE SURGERY Bilateral    cataracts   IR US  GUIDE BX ASP/DRAIN  05/31/2024   TONSILLECTOMY Bilateral 06/14/2024   Procedure: TONSILLECTOMY;  Surgeon: Tobie Eldora NOVAK, MD;  Location: [facility] OR;  Service: ENT;  Laterality: Bilateral;    Family History  Problem Relation Age of Onset   Heart disease Mother    Stroke Mother    Cancer Father    Cancer Sister    COPD Brother    Heart disease Sister    COPD Sister      Social Connections: Not on file      Current Outpatient Medications:    acetaminophen  (TYLENOL ) 500 MG tablet, Take 2 tablets (1,000 mg total) by mouth every 6 (six) hours., Disp: 100 tablet, Rfl: 2   albuterol  (VENTOLIN  HFA) 108 (90 Base) MCG/ACT inhaler, Inhale 1-2 puffs into the lungs every 6 (six) hours as needed for wheezing or shortness of breath., Disp: , Rfl:    amoxicillin (AMOXIL) 875 MG tablet, 1 tablet Orally Twice a day; Duration: 7 days, Disp: , Rfl:    aspirin EC 81 MG tablet, Take 1 tablet (81 mg total) by mouth daily., Disp: , Rfl:    atorvastatin  (LIPITOR) 20 MG tablet, Take 20 mg by mouth daily., Disp: , Rfl:    cetirizine (ZYRTEC) 10 MG tablet, Take 10 mg by mouth daily., Disp: , Rfl:    Cholecalciferol  (VITAMIN D3) 50 MCG (2000 UT) CHEW, Chew 2,000 Units by mouth daily., Disp: , Rfl:    DULoxetine  (CYMBALTA ) 60 MG capsule, Take 60 mg by mouth daily., Disp: , Rfl:    FARXIGA 10 MG TABS tablet, Take 10 mg by mouth daily., Disp: , Rfl:    fluticasone  (FLONASE ) 50 MCG/ACT nasal spray, Place 1 spray into both nostrils daily., Disp: , Rfl:    ibuprofen (ADVIL) 200 MG tablet, Take 2 tablets (400 mg total) by mouth every 6 (six) hours as needed for mild pain (pain score 1-3)., Disp: 30 tablet, Rfl: 0   lidocaine  (XYLOCAINE ) 2 % solution, 10ml gargle/swallow every 4-6 hours as needed; Duration: 7 days, Disp: , Rfl:    losartan  (COZAAR ) 50 MG tablet, Take 50 mg by mouth daily., Disp: , Rfl:    metFORMIN (GLUCOPHAGE-XR) 500 MG 24 hr tablet,  Take 1,000 mg by mouth 2 (two) times daily., Disp: , Rfl:    nystatin (MYCOSTATIN) 100000 UNIT/ML suspension, Swish 5 ml for 5 minutes and spit out three times a day, Disp: , Rfl:    omeprazole  (PRILOSEC ) 20 MG capsule, Take 20 mg by mouth daily., Disp: , Rfl:    sodium fluoride (FLUORISHIELD) 1.1 % GEL dental gel, After completion of HNRT, start using with fluoride trays nightly. Wear for 20 minutes, Disp: , Rfl:    tadalafil (CIALIS) 20 MG tablet, Take 20 mg by mouth daily as needed for erectile dysfunction., Disp: , Rfl:    Physical Exam:   BP 101/69 (BP Location: Left Arm, Patient Position: Sitting, Cuff Size: Large)   Pulse (!) 107   Ht 6' (1.829 m)   Wt 201 lb (91.2 kg)   SpO2 95%   BMI 27.26 kg/m   Salient findings:  CN II-XII intact  Bilateral EAC clear and TM intact with well pneumatized middle ear spaces Anterior rhinoscopy: Septum intact; bilateral inferior turbinates without significant hypertrophy No lesions of oral cavity/oropharynx; tonsillar fossae without bleeding, healing appropriately Left level 2 mass - appears mobile but large, turns neck without tenderness No respiratory distress or stridor  Seprately Identifiable Procedures:  Prior to initiating any procedures, risks/benefits/alternatives were explained to the patient and verbal consent obtained. Procedure Note (Prior, not today) Pre-procedure diagnosis: Left neck squamous cell carcinoma and mass; Post-procedure diagnosis: Same Procedure: Transnasal Fiberoptic Laryngoscopy, CPT 31575 - Mod 25 Indication: see above Complications: None apparent EBL: 0 mL  The procedure was undertaken to further evaluate the patient's complaint above, with mirror exam inadequate for appropriate examination due to gag reflex and poor patient tolerance  Procedure:  Patient was identified as correct patient. Verbal consent was obtained. The nose was sprayed with oxymetazoline and 4% lidocaine . The The flexible laryngoscope was  passed through the nose to view the nasal cavity, pharynx (oropharynx, hypopharynx) and larynx.  The larynx was examined at rest and during multiple phonatory tasks. Documentation was obtained and reviewed with patient. The scope was removed. The patient tolerated the procedure well.  Findings: The nasal cavity and nasopharynx did not reveal any masses or lesions, mucosa appeared to be without obvious lesions. The tongue base, pharyngeal walls, piriform sinuses, vallecula, epiglottis and postcricoid region are normal in appearance EXCEPT - slightly more erythematous, potentially raised area left GT sulcus The visualized portion of the subglottis and proximal trachea is widely patent. The vocal folds are mobile bilaterally. There are no lesions on the free edge of the vocal folds nor elsewhere in the larynx worrisome for malignancy.    Electronically signed by: [name], MD 07/01/2024 8:42 AM   Impression & Plans:  Howard Simmons is a 73 y.o. male with:  1. Squamous cell carcinoma of neck    SCCa Neck of unknown primary with 1+ LN that is known. Now s/p DL/Bx/Tonsillectomy, still with unknown primary.  He is having a lot of discomfort after tonsillectomy but improving.  We had a long discussion about options including recommendations after TB:  1. Chemo/RT 2. Left neck dissection with final pathology (if favorable), then just RT 3.  Bilateral Neck dissection and then RT, but at lower doses to neck --- per discussion with Rad/Onc, would recommend only left Neck 4.  Left Neck dissection + CRT --- if ENE noted during final path  I had a very long discussion about options. Patient really wishes to have the mass removed and would like to proceed with neck dissection. I explained that given imaging findings, we may have to sacrifice his jugular vein to do this, and then even then if ENE he will need chemotherapy. We also discussed R/B/A for surgery, including pain, bleeding, infection, numbness,  risks to major vascular structures and life threatening bleed, death, stroke, tongue or lip weakness, chyle leak, injury to vagus or phrenic nerve, change in speech or swallowing, need for further procedures or treatment. We also discussed post-op management and expectations including need for drain and overnight observation.  He still wishes to pursue neck dissection despite knowing the above risks. I advised him to think about this and I will call him tomorrow to know his final decision.   See below regarding exact medications prescribed this encounter including dosages and route: No orders of the defined types were placed in this encounter.     Thank you for allowing me the  opportunity to care for your patient. Please do not hesitate to contact me should you have any other questions.  Sincerely, Eldora Blanch, MD Otolaryngologist (ENT), [facility] Phone: [phone] Fax: [phone]  07/01/2024, 8:42 AM   I have personally spent 45 minutes involved in face-to-face and non-face-to-face activities for this patient on the day of the visit.  Professional time spent excludes any procedures performed but includes the following activities, in addition to those noted in the documentation: preparing to see the patient (review of outside documentation and results), performing a medically appropriate examination, extensive counseling, documenting in the electronic health record

## 2024-07-04 ENCOUNTER — Encounter (HOSPITAL_COMMUNITY): Payer: Self-pay | Admitting: *Deleted

## 2024-07-04 ENCOUNTER — Other Ambulatory Visit: Payer: Self-pay

## 2024-07-04 NOTE — Pre-Procedure Instructions (Signed)
-------------    SDW INSTRUCTIONS given:  Your procedure is scheduled on 11/19.  Report to [facility]trance A at 1:15 P.M., and check in at the Admitting office.  Any questions or running late day of surgery: call [phone]    Remember:  Do not eat or drink after midnight the night before your surgery      Take these medicines the morning of surgery with A SIP OF WATER  tylenol , atorvastatin , zyrtec, cymbalta , flonase , omeprazole         May take these medicines IF NEEDED: Albuterol - bring inhaler with you    Follow your surgeon's instructions on when to stop Aspirin.  If no instructions were given by your surgeon then you will need to call the office to get those instructions.    As of today, STOP taking any Aleve, Naproxen, Ibuprofen, Motrin, Advil, Goody's, BC's, all herbal medications, fish oil, and all vitamins.   Do NOT Smoke (Tobacco/Vaping) 24 hours prior to your procedure  If you use a CPAP at night, you may bring all equipment for your overnight stay.     You will be asked to remove any contacts, glasses, piercing's, hearing aid's, dentures/partials prior to surgery. Please bring cases for these items if needed.     Patients discharged the day of surgery will not be allowed to drive home, and someone needs to stay with them for 24 hours.  SURGICAL WAITING ROOM VISITATION Patients may have no more than 2 support people in the waiting area - these visitors may rotate.   Pre-op nurse will coordinate an appropriate time for 1 ADULT support person, who may not rotate, to accompany patient in pre-op.  Children under the age of 62 must have an adult with them who is not the patient and must remain in the main waiting area with an adult.  If the patient needs to stay at the hospital during part of their recovery, the visitor guidelines for inpatient rooms apply.  Please refer to the [facility] website for the visitor guidelines for any additional  information.   Special instructions:   [place]- Preparing For Surgery   Please follow these instructions carefully.   Shower the NIGHT BEFORE SURGERY and the MORNING OF SURGERY with DIAL Soap.   Pat yourself dry with a CLEAN TOWEL.  Wear CLEAN PAJAMAS to bed the night before surgery  Place CLEAN SHEETS on your bed the night of your first shower and DO NOT SLEEP WITH PETS.   Additional instructions for the day of surgery: DO NOT APPLY any lotions, deodorants, cologne, or perfumes.   Do not wear jewelry or makeup Do not wear nail polish, gel polish, artificial nails, or any other type of covering on natural nails (fingers and toes) Do not bring valuables to the hospital. [facility] is not responsible for valuables/personal belongings. Put on clean/comfortable clothes.  Please brush your teeth.  Ask your nurse before applying any prescription medications to the skin.

## 2024-07-04 NOTE — Progress Notes (Signed)
 PCP - Dr. Montie Pizza Cardiologist - denies  PPM/ICD - denies  Chest x-ray - 01/07/22 EKG - 06/14/24 Stress Test - denies ECHO - denies Cardiac Cath - denies  CPAP - OSA+, pt does not use CPAP  DM- pt does not check CBG at home and does not know fasting levels  Blood Thinner Instructions: n/a Aspirin Instructions: f/u with surgeon (pt has not taken ASA in about 1 month)  ERAS Protcol - no, NPO  COVID TEST- n/a  Anesthesia review: no  Patient verbally denies any shortness of breath, fever, cough and chest pain during phone call     Questions were answered. Patient verbalized understanding of instructions.

## 2024-07-05 ENCOUNTER — Other Ambulatory Visit: Payer: Self-pay

## 2024-07-05 ENCOUNTER — Inpatient Hospital Stay (HOSPITAL_COMMUNITY): Payer: Self-pay

## 2024-07-05 ENCOUNTER — Encounter: Admission: RE | Disposition: A | Payer: Self-pay | Source: Home / Self Care | Attending: Otolaryngology

## 2024-07-05 ENCOUNTER — Inpatient Hospital Stay (HOSPITAL_COMMUNITY)
Admission: RE | Admit: 2024-07-05 | Discharge: 2024-07-07 | DRG: 827 | Disposition: A | Discharge status: Home / Self Care | Attending: Otolaryngology | Admitting: Otolaryngology

## 2024-07-05 ENCOUNTER — Encounter (HOSPITAL_COMMUNITY): Payer: Self-pay

## 2024-07-05 DIAGNOSIS — C770 Secondary and unspecified malignant neoplasm of lymph nodes of head, face and neck: Secondary | ICD-10-CM | POA: Diagnosis present

## 2024-07-05 DIAGNOSIS — J449 Chronic obstructive pulmonary disease, unspecified: Secondary | ICD-10-CM | POA: Diagnosis not present

## 2024-07-05 DIAGNOSIS — M1990 Unspecified osteoarthritis, unspecified site: Secondary | ICD-10-CM | POA: Diagnosis not present

## 2024-07-05 DIAGNOSIS — C4442 Squamous cell carcinoma of skin of scalp and neck: Secondary | ICD-10-CM

## 2024-07-05 DIAGNOSIS — G4733 Obstructive sleep apnea (adult) (pediatric): Secondary | ICD-10-CM | POA: Diagnosis present

## 2024-07-05 DIAGNOSIS — C7989 Secondary malignant neoplasm of other specified sites: Secondary | ICD-10-CM | POA: Diagnosis not present

## 2024-07-05 DIAGNOSIS — Z8701 Personal history of pneumonia (recurrent): Secondary | ICD-10-CM

## 2024-07-05 DIAGNOSIS — C4492 Squamous cell carcinoma of skin, unspecified: Secondary | ICD-10-CM

## 2024-07-05 DIAGNOSIS — E785 Hyperlipidemia, unspecified: Secondary | ICD-10-CM | POA: Diagnosis not present

## 2024-07-05 DIAGNOSIS — C801 Malignant (primary) neoplasm, unspecified: Secondary | ICD-10-CM | POA: Diagnosis present

## 2024-07-05 DIAGNOSIS — E119 Type 2 diabetes mellitus without complications: Secondary | ICD-10-CM | POA: Diagnosis present

## 2024-07-05 DIAGNOSIS — Z87891 Personal history of nicotine dependence: Secondary | ICD-10-CM

## 2024-07-05 DIAGNOSIS — F418 Other specified anxiety disorders: Secondary | ICD-10-CM

## 2024-07-05 DIAGNOSIS — Z9889 Other specified postprocedural states: Principal | ICD-10-CM

## 2024-07-05 DIAGNOSIS — I10 Essential (primary) hypertension: Secondary | ICD-10-CM | POA: Diagnosis present

## 2024-07-05 DIAGNOSIS — C792 Secondary malignant neoplasm of skin: Principal | ICD-10-CM | POA: Diagnosis present

## 2024-07-05 HISTORY — PX: RADICAL NECK DISSECTION: SHX2284

## 2024-07-05 LAB — BASIC METABOLIC PANEL WITH GFR
Anion gap: 15 (ref 5–15)
BUN: 14 mg/dL (ref 8–23)
CO2: 19 mmol/L — ABNORMAL LOW (ref 22–32)
Calcium: 8.9 mg/dL (ref 8.9–10.3)
Chloride: 105 mmol/L (ref 98–111)
Creatinine, Ser: 0.84 mg/dL (ref 0.61–1.24)
GFR, Estimated: >60 mL/min (ref >60)
Glucose, Bld: 126 mg/dL — ABNORMAL HIGH (ref 70–99)
Potassium: 3.5 mmol/L (ref 3.5–5.1)
Sodium: 139 mmol/L (ref 135–145)

## 2024-07-05 LAB — GLUCOSE, CAPILLARY
Glucose-Capillary: 123 mg/dL — ABNORMAL HIGH (ref 70–99)
Glucose-Capillary: 96 mg/dL (ref 70–99)
Glucose-Capillary: 95 mg/dL (ref 70–99)
Glucose-Capillary: 109 mg/dL — ABNORMAL HIGH (ref 70–99)
Glucose-Capillary: 95 mg/dL (ref 70–99)

## 2024-07-05 LAB — CBC
HCT: 41.3 % (ref 39.0–52.0)
Hemoglobin: 13.7 g/dL (ref 13.0–17.0)
MCH: 30.3 pg (ref 26.0–34.0)
MCHC: 33.2 g/dL (ref 30.0–36.0)
MCV: 91.4 fL (ref 80.0–100.0)
Platelets: 349 K/uL (ref 150–400)
RBC: 4.52 MIL/uL (ref 4.22–5.81)
RDW: 13.5 % (ref 11.5–15.5)
WBC: 7 K/uL (ref 4.0–10.5)
nRBC: 0 % (ref 0.0–0.2)

## 2024-07-05 SURGERY — DISSECTION, NECK, RADICAL
Anesthesia: General | Laterality: Left

## 2024-07-05 MED ORDER — REMIFENTANIL HCL 2 MG IV SOLR
INTRAVENOUS | Status: AC
  Filled 2024-07-05: qty 2000

## 2024-07-05 MED ORDER — HYDROMORPHONE HCL 1 MG/ML IJ SOLN
INTRAMUSCULAR | Status: AC
  Filled 2024-07-05: qty 1

## 2024-07-05 MED ORDER — ACETAMINOPHEN 10 MG/ML IV SOLN
INTRAVENOUS | Status: AC
  Filled 2024-07-05: qty 100

## 2024-07-05 MED ORDER — SUCCINYLCHOLINE CHLORIDE 200 MG/10ML IV SOSY
PREFILLED_SYRINGE | INTRAVENOUS | Status: DC | PRN
  Administered 2024-07-05: 120 mg via INTRAVENOUS

## 2024-07-05 MED ORDER — ONDANSETRON HCL 4 MG/2ML IJ SOLN: 4.0000 mg | Freq: Once | INTRAMUSCULAR | Status: DC | PRN

## 2024-07-05 MED ORDER — FENTANYL CITRATE (PF) 100 MCG/2ML IJ SOLN
INTRAMUSCULAR | Status: AC
  Filled 2024-07-05: qty 2

## 2024-07-05 MED ORDER — ACETAMINOPHEN 500 MG PO TABS
1000.0000 mg | ORAL_TABLET | Freq: Once | ORAL | Status: AC
  Administered 2024-07-05: 1000 mg via ORAL
  Filled 2024-07-05: qty 2

## 2024-07-05 MED ORDER — FENTANYL CITRATE (PF) 100 MCG/2ML IJ SOLN
25.0000 ug | INTRAMUSCULAR | Status: DC | PRN
  Administered 2024-07-05: 50 ug via INTRAVENOUS

## 2024-07-05 MED ORDER — INSULIN ASPART 100 UNIT/ML IJ SOLN: 0.0000 [IU] | INTRAMUSCULAR | Status: DC | PRN

## 2024-07-05 MED ORDER — CEFAZOLIN SODIUM 1 G IJ SOLR
INTRAMUSCULAR | Status: AC
  Filled 2024-07-05: qty 10

## 2024-07-05 MED ORDER — BACITRACIN ZINC 500 UNIT/GM EX OINT
TOPICAL_OINTMENT | CUTANEOUS | Status: AC
Start: 2024-07-05 — End: 2024-07-05
  Filled 2024-07-05: qty 28.35

## 2024-07-05 MED ORDER — ACETAMINOPHEN 10 MG/ML IV SOLN
1000.0000 mg | Freq: Once | INTRAVENOUS | Status: AC
  Administered 2024-07-05: 1000 mg via INTRAVENOUS

## 2024-07-05 MED ORDER — PHENYLEPHRINE HCL (PRESSORS) 10 MG/ML IV SOLN
INTRAVENOUS | Status: DC | PRN
  Administered 2024-07-05 (×3): 80 ug via INTRAVENOUS

## 2024-07-05 MED ORDER — CHLORHEXIDINE GLUCONATE 0.12 % MT SOLN
OROMUCOSAL | Status: AC
Start: 2024-07-05 — End: 2024-07-05
  Administered 2024-07-05: 15 mL via OROMUCOSAL
  Filled 2024-07-05: qty 15

## 2024-07-05 MED ORDER — 0.9 % SODIUM CHLORIDE (POUR BTL) OPTIME
TOPICAL | Status: DC | PRN
  Administered 2024-07-05: 1000 mL

## 2024-07-05 MED ORDER — FENTANYL CITRATE (PF) 250 MCG/5ML IJ SOLN
INTRAMUSCULAR | Status: DC | PRN
  Administered 2024-07-05: 100 ug via INTRAVENOUS

## 2024-07-05 MED ORDER — PHENYLEPHRINE HCL-NACL 20-0.9 MG/250ML-% IV SOLN
INTRAVENOUS | Status: DC | PRN
  Administered 2024-07-05: 40 ug/min via INTRAVENOUS

## 2024-07-05 MED ORDER — BACITRACIN ZINC 500 UNIT/GM EX OINT
TOPICAL_OINTMENT | CUTANEOUS | Status: DC | PRN
  Administered 2024-07-05: 1 via TOPICAL

## 2024-07-05 MED ORDER — LIDOCAINE-EPINEPHRINE 1 %-1:100000 IJ SOLN
INTRAMUSCULAR | Status: AC
  Filled 2024-07-05: qty 1

## 2024-07-05 MED ORDER — LIDOCAINE-EPINEPHRINE 1 %-1:100000 IJ SOLN
INTRAMUSCULAR | Status: DC | PRN
  Administered 2024-07-05: 7 mL

## 2024-07-05 MED ORDER — PROPOFOL 10 MG/ML IV BOLUS
INTRAVENOUS | Status: DC | PRN
  Administered 2024-07-05: 120 mg via INTRAVENOUS
  Administered 2024-07-05: 120 ug/kg/min via INTRAVENOUS

## 2024-07-05 MED ORDER — LACTATED RINGERS IV SOLN: INTRAVENOUS | Status: DC | PRN

## 2024-07-05 MED ORDER — HYDROMORPHONE HCL 1 MG/ML IJ SOLN
0.5000 mg | INTRAMUSCULAR | Status: DC | PRN
  Administered 2024-07-05 (×3): 0.5 mg via INTRAVENOUS

## 2024-07-05 MED ORDER — EPHEDRINE SULFATE (PRESSORS) 25 MG/5ML IV SOSY
PREFILLED_SYRINGE | INTRAVENOUS | Status: DC | PRN
  Administered 2024-07-05: 5 mg via INTRAVENOUS

## 2024-07-05 MED ORDER — LACTATED RINGERS IV SOLN: INTRAVENOUS | Status: DC

## 2024-07-05 MED ORDER — FENTANYL CITRATE (PF) 250 MCG/5ML IJ SOLN
INTRAMUSCULAR | Status: AC
  Filled 2024-07-05: qty 5

## 2024-07-05 MED ORDER — SODIUM CHLORIDE 0.9 % IV SOLN
INTRAVENOUS | Status: DC | PRN
  Administered 2024-07-05: .1 ug/kg/min via INTRAVENOUS
  Administered 2024-07-05: .2 ug/kg/min via INTRAVENOUS

## 2024-07-05 MED ORDER — CEFAZOLIN SODIUM-DEXTROSE 2-3 GM-%(50ML) IV SOLR
INTRAVENOUS | Status: DC | PRN
  Administered 2024-07-05: 2 g via INTRAVENOUS

## 2024-07-05 MED ORDER — LIDOCAINE 2% (20 MG/ML) 5 ML SYRINGE
INTRAMUSCULAR | Status: DC | PRN
  Administered 2024-07-05: 80 mg via INTRAVENOUS

## 2024-07-05 MED ORDER — CHLORHEXIDINE GLUCONATE 0.12 % MT SOLN: 15.0000 mL | Freq: Once | OROMUCOSAL | Status: AC

## 2024-07-05 MED ORDER — ORAL CARE MOUTH RINSE: 15.0000 mL | Freq: Once | OROMUCOSAL | Status: AC

## 2024-07-05 MED ORDER — ONDANSETRON HCL 4 MG/2ML IJ SOLN
INTRAMUSCULAR | Status: DC | PRN
  Administered 2024-07-05: 4 mg via INTRAVENOUS

## 2024-07-05 SURGICAL SUPPLY — 61 items
BAG COUNTER SPONGE SURGICOUNT (BAG) ×1 IMPLANT
BLADE SURG 15 STRL LF DISP TIS (BLADE) ×1 IMPLANT
CANISTER SUCTION 3000ML PPV (SUCTIONS) ×1 IMPLANT
CLIP TI LARGE 6 (CLIP) IMPLANT
CLIP TI MEDIUM 24 (CLIP) ×1 IMPLANT
CLIP TI WIDE RED SMALL 24 (CLIP) ×1 IMPLANT
CLIP TI WIDE RED SMALL 6 (CLIP) IMPLANT
CORD BIPOLAR FORCEPS 12FT (ELECTRODE) ×1 IMPLANT
COVER SURGICAL LIGHT HANDLE (MISCELLANEOUS) ×1 IMPLANT
DRAIN 1/8 RD END PERF LFSIL ST (DRAIN) IMPLANT
DRAIN CHANNEL 19F RND (DRAIN) IMPLANT
DRAIN PENROSE 0.25X18 (DRAIN) IMPLANT
DRAIN WND 3/16 RDEND PERF LF (DRAIN) IMPLANT
DRAPE INCISE IOBAN 66X45 STRL (DRAPES) ×1 IMPLANT
DRAPE UTILITY XL STRL (DRAPES) IMPLANT
DRSG TEGADERM 2-3/8X2-3/4 SM (GAUZE/BANDAGES/DRESSINGS) IMPLANT
DRSG TEGADERM 4X4.75 (GAUZE/BANDAGES/DRESSINGS) IMPLANT
ELECT COATED BLADE 2.86 ST (ELECTRODE) ×1 IMPLANT
ELECTRODE REM PT RTRN 9FT ADLT (ELECTROSURGICAL) ×1 IMPLANT
EVACUATOR SILICONE 100CC (DRAIN) IMPLANT
FILTER STRAW FLUID ASPIR (MISCELLANEOUS) IMPLANT
GAUZE 4X4 16PLY ~~LOC~~+RFID DBL (SPONGE) IMPLANT
GLOVE BIO SURGEON STRL SZ 6.5 (GLOVE) ×1 IMPLANT
GLOVE BIO SURGEON STRL SZ7.5 (GLOVE) ×1 IMPLANT
GLOVE SURG SS PI 7.5 STRL IVOR (GLOVE) IMPLANT
GOWN STRL REUS W/ TWL LRG LVL3 (GOWN DISPOSABLE) ×1 IMPLANT
GOWN STRL REUS W/ TWL XL LVL3 (GOWN DISPOSABLE) ×1 IMPLANT
GOWN STRL REUS W/TWL 2XL LVL3 (GOWN DISPOSABLE) IMPLANT
HEMOSTAT SNOW SURGICEL 2X4 (HEMOSTASIS) IMPLANT
HOOK RETRACT STAY BLUNT 12 (MISCELLANEOUS) IMPLANT
KIT BASIN OR (CUSTOM PROCEDURE TRAY) ×1 IMPLANT
KIT TURNOVER KIT B (KITS) ×1 IMPLANT
LOCATOR NERVE 3 VOLT (DISPOSABLE) IMPLANT
MARKER SKIN DUAL TIP RULER LAB (MISCELLANEOUS) ×1 IMPLANT
NDL HYPO 25GX1X1/2 BEV (NEEDLE) ×1 IMPLANT
NEEDLE HYPO 25GX1X1/2 BEV (NEEDLE) ×2 IMPLANT
PAD ARMBOARD POSITIONER FOAM (MISCELLANEOUS) ×2 IMPLANT
PAD MAGNETIC INSTR ST 16X20 (MISCELLANEOUS) IMPLANT
PENCIL SMOKE EVACUATOR (MISCELLANEOUS) ×1 IMPLANT
POSITIONER HEAD DONUT 9IN (MISCELLANEOUS) IMPLANT
RETRACTOR STAY HOOK 5MM (MISCELLANEOUS) IMPLANT
SEALANT PATCH FIBRIN 2X4IN (MISCELLANEOUS) IMPLANT
SET WALTER ACTIVATION W/DRAPE (SET/KITS/TRAYS/PACK) IMPLANT
SOLN 0.9% NACL POUR BTL 1000ML (IV SOLUTION) ×1 IMPLANT
SOLN STERILE WATER BTL 1000 ML (IV SOLUTION) ×1 IMPLANT
SPONGE INTESTINAL PEANUT (DISPOSABLE) ×1 IMPLANT
STAPLER SKIN PROX 35W (STAPLE) IMPLANT
SUT ETHILON 5 0 PS 2 18 (SUTURE) IMPLANT
SUT MNCRL AB 4-0 PS2 18 (SUTURE) IMPLANT
SUT PROLENE 5 0 C 1 36 (SUTURE) IMPLANT
SUT SILK 2 0 PERMA HAND 18 BK (SUTURE) IMPLANT
SUT SILK 2 0 TIES 10X30 (SUTURE) ×1 IMPLANT
SUT SILK 3 0 TIES 10X30 (SUTURE) ×1 IMPLANT
SUT VIC AB 3-0 SH 8-18 (SUTURE) IMPLANT
SUT VIC AB 4-0 PS2 18 (SUTURE) IMPLANT
SUT VIC AB 4-0 RB1 18 (SUTURE) IMPLANT
SUT VIC AB 5-0 PS2 18 (SUTURE) IMPLANT
SYR CONTROL 10ML LL (SYRINGE) IMPLANT
TOWEL GREEN STERILE FF (TOWEL DISPOSABLE) ×1 IMPLANT
TRAY ENT MC OR (CUSTOM PROCEDURE TRAY) ×1 IMPLANT
TRAY FOLEY MTR SLVR 14FR STAT (SET/KITS/TRAYS/PACK) IMPLANT

## 2024-07-05 NOTE — Transfer of Care (Signed)
 Immediate Anesthesia Transfer of Care Note  Patient: Howard Simmons  Procedure(s) Performed: DISSECTION, NECK, RADICAL (Left)  Patient Location: PACU  Anesthesia Type:General  Level of Consciousness: awake, alert , and oriented  Airway & Oxygen Therapy: Patient Spontanous Breathing and Patient connected to face mask oxygen  Post-op Assessment: Report given to RN and Post -op Vital signs reviewed and stable  Post vital signs: Reviewed and stable  Last Vitals:  Vitals Value Taken Time  BP 123/69 07/05/24 22:31  Temp 36.8 C 07/05/24 22:31  Pulse 112 07/05/24 22:35  Resp 23 07/05/24 22:35  SpO2 92 % 07/05/24 22:35  Vitals shown include unfiled device data.  Last Pain:  Vitals:   07/05/24 1334  TempSrc:   PainSc: 0-No pain         Complications: No notable events documented.

## 2024-07-05 NOTE — H&P (Signed)
 Pre-Operative H&P - Day Of Surgery Patient Name: Howard Simmons Date:   07/05/2024  HPI: Howard Simmons is a 73 y.o. male who presents today for operative treatment of left neck squamous cell carcinoma of unknown primary. Patient denies recent significant changes to health or significant new medications or physiologic change in condition which would immediately impact plans. No new types of therapy has been initiated that would change the plan or the appropriateness of the plan.   ROS:  A complete review of systems was obtained and is otherwise negative.   PMH:  Past Medical History:  Diagnosis Date   Anxiety 04/13/2024   Cancer (HCC) 05/2024   squamous cell of head and neck region, unknown primary   COPD (chronic obstructive pulmonary disease) (HCC) 04/07/2022   Depression 04/28/2024   Diabetes mellitus without complication (HCC) 01/13/2024   type 2   ED (erectile dysfunction)    on cialis   GERD (gastroesophageal reflux disease)    Hyperlipidemia    Hypertension    Pneumonia 10/29/2021   right lower lobe   Seasonal allergies    Sleep apnea    mild , does not use cpap    PSH:  Past Surgical History:  Procedure Laterality Date   COLON SURGERY     COLONOSCOPY WITH PROPOFOL  N/A 12/02/2015   Procedure: COLONOSCOPY WITH PROPOFOL ;  Surgeon: Gladis MARLA Louder, MD;  Location: [facility];  Service: Endoscopy;  Laterality: N/A;   DIRECT LARYNGOSCOPY N/A 06/14/2024   Procedure: LARYNGOSCOPY, DIRECT,  WITH BIOPSY;  Surgeon: Tobie Eldora NOVAK, MD;  Location: [facility] OR;  Service: ENT;  Laterality: N/A;   EYE SURGERY Bilateral    cataracts   IR US  GUIDE BX ASP/DRAIN  05/31/2024   TONSILLECTOMY Bilateral 06/14/2024   Procedure: TONSILLECTOMY;  Surgeon: Tobie Eldora NOVAK, MD;  Location: [facility] OR;  Service: ENT;  Laterality: Bilateral;    MEDS:   Current Facility-Administered Medications:    acetaminophen  (TYLENOL ) tablet 1,000 mg, 1,000 mg, Oral, Once, Corinne Garnette BRAVO, MD   insulin  aspart (novoLOG )  injection 0-7 Units, 0-7 Units, Subcutaneous, Q2H PRN, Jerrye Sharper, MD   lactated ringers  infusion, , Intravenous, Continuous, Jerrye Sharper, MD, Last Rate: 10 mL/hr at 07/05/24 1336, New Bag at 07/05/24 1336  ALLERGIES: Lisinopril and Ozempic (0.25 or 0.5 mg-dose) [semaglutide(0.25 or 0.5mg -dos)]  EXAM: Vitals: BP 127/79   Pulse 96   Temp 98.8 F (37.1 C) (Oral)   Resp 20   Ht 6' (1.829 m)   Wt 99.8 kg   SpO2 95%   BMI 29.84 kg/m   General Awake, at baseline alertness.   HEENT No scleral icterus or conjunctival hemorrhage. Globe position appears normal. External ears  normal. Nose patent without rhinorrhea. Left neck mass -- does not appear fixed at this point -- level 2. No thyromegaly  Cardiovascular No cyanosis.  Pulmonary No audible stridor. Breathing easily with no labor.  Neuro Symmetric facial movement.   Psychiatry Appropriate affect and mood.  Skin No scars or lesions on face.   Extermities Moves all extremities with normal range of motion.   Other Findings None.   Assessment & Plan: Howard Simmons has diagnoses of  left neck squamous cell carcinoma of unknown primary and will go to the OR today for left neck dissection. Informed consent was obtained and available in EMR today. All questions have been answered, and risks/benefits/alternatives of procedure as noted in the consent were discussed in a quiet area. Questions were invited and answered. The patient expressed understanding, provided  consent and wished to proceed despite risks.  We also discussed R/Howard/A for surgery, including pain, bleeding, infection, numbness, risks to major vascular structures and life threatening bleed, tongue or lip weakness, chyle leak, injury to vagus or phrenic nerve, change in speech or swallowing, inability to resect, need for further procedures or treatment. We also discussed post-op management and expectations including possible need for drain.   Howard Simmons Howard Simmons 07/05/2024 3:15 PM

## 2024-07-05 NOTE — Anesthesia Procedure Notes (Signed)
 Procedure Name: Intubation Date/Time: 07/05/2024 4:25 PM  Performed by: Bess Josette ORN, CRNAPre-anesthesia Checklist: Patient identified, Emergency Drugs available, Suction available and Patient being monitored Patient Re-evaluated:Patient Re-evaluated prior to induction Oxygen Delivery Method: Circle System Utilized Preoxygenation: Pre-oxygenation with 100% oxygen Induction Type: IV induction Ventilation: Mask ventilation without difficulty Laryngoscope Size: McGrath and 3 Grade View: Grade I Tube type: Oral Tube size: 7.5 mm Number of attempts: 1 Airway Equipment and Method: Stylet Placement Confirmation: ETT inserted through vocal cords under direct vision, positive ETCO2 and breath sounds checked- equal and bilateral Secured at: 23 cm Tube secured with: Tape Dental Injury: Teeth and Oropharynx as per pre-operative assessment

## 2024-07-05 NOTE — Anesthesia Preprocedure Evaluation (Addendum)
 Anesthesia Evaluation  Patient identified by MRN, date of birth, ID band Patient awake    Reviewed: Allergy & Precautions, NPO status , Patient's Chart, lab work & pertinent test results  Airway Mallampati: III  TM Distance: <3 FB Neck ROM: Full    Dental  (+) Teeth Intact, Dental Advisory Given, Caps,    Pulmonary sleep apnea , COPD,  COPD inhaler, former smoker   Pulmonary exam normal breath sounds clear to auscultation       Cardiovascular hypertension, Pt. on medications (-) angina (-) Past MI Normal cardiovascular exam Rhythm:Regular Rate:Normal     Neuro/Psych  PSYCHIATRIC DISORDERS Anxiety Depression    negative neurological ROS     GI/Hepatic Neg liver ROS,GERD  Medicated and Controlled,,  Endo/Other  diabetes, Type 2, Oral Hypoglycemic Agents    Renal/GU negative Renal ROS     Musculoskeletal  (+) Arthritis ,    Abdominal   Peds  Hematology negative hematology ROS (+)   Anesthesia Other Findings Day of surgery medications reviewed with the patient.  Squamous cell carcinoma of neck  Reproductive/Obstetrics                              Anesthesia Physical Anesthesia Plan  ASA: 3  Anesthesia Plan: General   Post-op Pain Management: Tylenol  PO (pre-op)*   Induction: Intravenous  PONV Risk Score and Plan: 2 and Midazolam, Dexamethasone, Ondansetron  and TIVA  Airway Management Planned: Oral ETT  Additional Equipment:   Intra-op Plan:   Post-operative Plan: Extubation in OR  Informed Consent: I have reviewed the patients History and Physical, chart, labs and discussed the procedure including the risks, benefits and alternatives for the proposed anesthesia with the patient or authorized representative who has indicated his/her understanding and acceptance.     Dental advisory given  Plan Discussed with: CRNA  Anesthesia Plan Comments: (2nd PIV after induction )          Anesthesia Quick Evaluation

## 2024-07-06 ENCOUNTER — Encounter (HOSPITAL_COMMUNITY): Payer: Self-pay | Admitting: Otolaryngology

## 2024-07-06 DIAGNOSIS — C4492 Squamous cell carcinoma of skin, unspecified: Secondary | ICD-10-CM

## 2024-07-06 MED ORDER — ENOXAPARIN SODIUM 40 MG/0.4ML IJ SOSY
40.0000 mg | PREFILLED_SYRINGE | INTRAMUSCULAR | Status: DC
  Administered 2024-07-06 – 2024-07-07 (×2): 40 mg via SUBCUTANEOUS
  Filled 2024-07-06 (×2): qty 0.4

## 2024-07-06 MED ORDER — SENNA 8.6 MG PO TABS
1.0000 | ORAL_TABLET | Freq: Two times a day (BID) | ORAL | Status: DC
  Administered 2024-07-06 – 2024-07-07 (×4): 8.6 mg via ORAL
  Filled 2024-07-06 (×4): qty 1

## 2024-07-06 MED ORDER — LOSARTAN POTASSIUM 50 MG PO TABS
50.0000 mg | ORAL_TABLET | Freq: Every day | ORAL | Status: DC
  Administered 2024-07-06 – 2024-07-07 (×2): 50 mg via ORAL
  Filled 2024-07-06 (×2): qty 1

## 2024-07-06 MED ORDER — ONDANSETRON HCL 4 MG PO TABS: 4.0000 mg | ORAL_TABLET | ORAL | Status: DC | PRN

## 2024-07-06 MED ORDER — OXYCODONE HCL 5 MG/5ML PO SOLN
5.0000 mg | ORAL | Status: DC | PRN
  Administered 2024-07-06 – 2024-07-07 (×5): 5 mg via ORAL
  Filled 2024-07-06 (×6): qty 5

## 2024-07-06 MED ORDER — ACETAMINOPHEN 160 MG/5ML PO SOLN
650.0000 mg | ORAL | Status: DC
  Administered 2024-07-06 – 2024-07-07 (×6): 650 mg via ORAL
  Filled 2024-07-06 (×7): qty 20.3

## 2024-07-06 MED ORDER — DOCUSATE SODIUM 100 MG PO CAPS
100.0000 mg | ORAL_CAPSULE | Freq: Two times a day (BID) | ORAL | Status: DC
  Administered 2024-07-06 – 2024-07-07 (×4): 100 mg via ORAL
  Filled 2024-07-06 (×4): qty 1

## 2024-07-06 MED ORDER — CELECOXIB 100 MG PO CAPS
100.0000 mg | ORAL_CAPSULE | Freq: Two times a day (BID) | ORAL | Status: DC
  Administered 2024-07-06 – 2024-07-07 (×4): 100 mg via ORAL
  Filled 2024-07-06 (×4): qty 1

## 2024-07-06 MED ORDER — ACETAMINOPHEN 650 MG RE SUPP
650.0000 mg | RECTAL | Status: DC
  Filled 2024-07-06: qty 1

## 2024-07-06 MED ORDER — PANTOPRAZOLE SODIUM 40 MG PO TBEC
40.0000 mg | DELAYED_RELEASE_TABLET | Freq: Every day | ORAL | Status: DC
  Administered 2024-07-06 – 2024-07-07 (×2): 40 mg via ORAL
  Filled 2024-07-06 (×2): qty 1

## 2024-07-06 MED ORDER — DULOXETINE HCL 60 MG PO CPEP
60.0000 mg | ORAL_CAPSULE | Freq: Every day | ORAL | Status: DC
  Administered 2024-07-06 – 2024-07-07 (×2): 60 mg via ORAL
  Filled 2024-07-06 (×2): qty 1

## 2024-07-06 MED ORDER — OXYCODONE HCL 5 MG/5ML PO SOLN
5.0000 mg | Freq: Once | ORAL | Status: AC
  Administered 2024-07-06: 5 mg via ORAL

## 2024-07-06 MED ORDER — BACITRACIN ZINC 500 UNIT/GM EX OINT
1.0000 | TOPICAL_OINTMENT | Freq: Two times a day (BID) | CUTANEOUS | Status: DC
  Administered 2024-07-06 – 2024-07-07 (×3): 1 via TOPICAL
  Filled 2024-07-06: qty 28.35

## 2024-07-06 MED ORDER — ONDANSETRON HCL 4 MG/2ML IJ SOLN
4.0000 mg | INTRAMUSCULAR | Status: DC | PRN
Start: 2024-07-06 — End: 2024-07-07

## 2024-07-06 NOTE — Plan of Care (Signed)
  Problem: Education: Goal: Knowledge of General Education information will improve Description: Including pain rating scale, medication(s)/side effects and non-pharmacologic comfort measures Outcome: Progressing   Problem: Health Behavior/Discharge Planning: Goal: Ability to manage health-related needs will improve Outcome: Progressing   Problem: Clinical Measurements: Goal: Ability to maintain clinical measurements within normal limits will improve Outcome: Progressing Goal: Will remain free from infection Outcome: Progressing Goal: Diagnostic test results will improve Outcome: Progressing Goal: Respiratory complications will improve Outcome: Progressing Goal: Cardiovascular complication will be avoided Outcome: Progressing   Problem: Activity: Goal: Risk for activity intolerance will decrease Outcome: Progressing   Problem: Nutrition: Goal: Adequate nutrition will be maintained Outcome: Progressing   Problem: Coping: Goal: Level of anxiety will decrease Outcome: Progressing   Problem: Elimination: Goal: Will not experience complications related to bowel motility Outcome: Progressing Goal: Will not experience complications related to urinary retention Outcome: Progressing   Problem: Pain Managment: Goal: General experience of comfort will improve and/or be controlled Outcome: Progressing   Problem: Safety: Goal: Ability to remain free from injury will improve Outcome: Progressing   Problem: Skin Integrity: Goal: Risk for impaired skin integrity will decrease Outcome: Progressing   Problem: Education: Goal: Knowledge of the prescribed therapeutic regimen will improve Outcome: Progressing   Problem: Activity: Goal: Ability to tolerate increased activity will improve Outcome: Progressing   Problem: Health Behavior/Discharge Planning: Goal: Identification of resources available to assist in meeting health care needs will improve Outcome: Progressing    Problem: Nutrition: Goal: Maintenance of adequate nutrition will improve Outcome: Progressing   Problem: Clinical Measurements: Goal: Complications related to the disease process, condition or treatment will be avoided or minimized Outcome: Progressing   Problem: Respiratory: Goal: Will regain and/or maintain adequate ventilation Outcome: Progressing   Problem: Skin Integrity: Goal: Demonstration of wound healing without infection will improve Outcome: Progressing   Problem: Education: Goal: Knowledge of medication regimen will be met for pain relief regimen by discharge Outcome: Progressing Goal: Understanding of ways to prevent infection will improve by discharge Outcome: Progressing   Problem: Pain Management: Goal: Satisfaction with pain management regimen will be met by discharge Outcome: Progressing

## 2024-07-06 NOTE — Anesthesia Postprocedure Evaluation (Signed)
 Anesthesia Post Note  Patient: Howard Simmons  Procedure(s) Performed: DISSECTION, NECK, RADICAL (Left)     Patient location during evaluation: PACU Anesthesia Type: General Level of consciousness: awake and alert Pain management: pain level controlled Vital Signs Assessment: post-procedure vital signs reviewed and stable Respiratory status: spontaneous breathing, nonlabored ventilation, respiratory function stable and patient connected to nasal cannula oxygen Cardiovascular status: blood pressure returned to baseline and stable Postop Assessment: no apparent nausea or vomiting Anesthetic complications: no   No notable events documented.  Last Vitals:  Vitals:   07/05/24 2345 07/06/24 0000  BP:  120/60  Pulse:  (!) 101  Resp:  16  Temp: 36.7 C 36.5 C  SpO2:  94%    Last Pain:  Vitals:   07/06/24 0000  TempSrc: Oral  PainSc:                  [name]

## 2024-07-06 NOTE — Progress Notes (Signed)
 ENT Progress note: S: NAEON. Expected left neck pain. Otherwise has voided. Drinking fairly well. No tongue or shoulder weakness on shrug and able to lift arm almost completely over his head with some pain O: Today's Vitals   07/06/24 1718 07/06/24 1839 07/06/24 1907 07/06/24 2039  BP:  (!) 102/55  (!) 104/55  Pulse:  97  95  Resp:  16  18  Temp:  98 F (36.7 C)  97.8 F (36.6 C)  TempSrc:    Oral  SpO2:  98%  95%  Weight:      Height:      PainSc: 8   8    Neck c/d/I, slight blanching erythema/rudiness; drain in place with ss output, no evidence of hematoma; Left marginal mandibular nerve slightly weak, otherwise tongue and shoulder movement as above  A/P: 73 yo with SCCa unknown primary s/p L ND 07/05/2024 Recovering Will keep him today and reassess discharge readiness tomorrow Regular diet; OOB as tolerated Home meds as appropriate; pain control with tylenol , celebrex and oxycodone   Howard Simmons

## 2024-07-06 NOTE — Discharge Instructions (Signed)
 Surgery Discharge Instructions:  Call clinic or return to ED if you: - develop a fever greater than 101.4 - have shaking chills or are feeling ill - become short of breath - have uncontrollable nausea or vomiting - can't hold down food or liquids or feel as though you are getting dehydrated - have leakage or drainage from wound - urine output of less than 30cc/hr for 12 hours - develop significant redness, pain at incision(s) or wound opens up/separates - any other acute events, problems, or concerns  Wound Care/Dressings/Drain Instructions:  - To take care of your incision/cut:  - Apply bacitracin ointment to the incision twice per day for 7 days. Then switch to vaseline. - It is ok to shower after the drain comes out. Until then, only shower below the neck and do not get the area wet - Take care of the drain and count how much comes out over each 24 hour period.   Medications: - Resume your regular home medications except as detailed in the medication reconciliation -- do not take the aspirin or any blood thinner for 7 total days after surgery - For pain, take tylenol  1000mg  every 6 hours alternating and Ibuprofen 400mg  every 6 hours. If that is not sufficient, a stronger pain medication has been prescribed to you - oxycodone  -- take 5mg  tablet every 4-6 hours as needed. Do not mix with any other narcotic medication. - To prevent infection, take augmentin one tablet two times per day for 7 days  Follow Up:  - A follow up appointment should be scheduled for you after discharge. Please let us  know if you do not know this  Activity/Restrictions:  - Resume your regular activities, as tolerated. Avoid heavy lifting or straining (more than 5 lbs) for 10 days.  Diet: - Resume your regular diet, as tolerated  Additional Instructions: - Please take an over the counter stool softener while taking narcotic pain medication - DO NOT MIX NARCOTIC PAIN MEDICATIONS OR TAKE NARCOTIC  PRESCRIPTIONS AT THE SAME TIME - DO NOT DRIVE OR OPERATE HEAVY MACHINERY WHILE ON NARCOTICS  - DO NOT TAKE MORE THAN 4 GRAMS (4000mg ) OF TYLENOL  (ACETAMINOPHEN ) IN 24 HOURS

## 2024-07-06 NOTE — Op Note (Signed)
 Otolaryngology Operative note  Howard Simmons Date/Time of Admission: 07/05/2024  1:01 PM  CSN: [id number];MRN:[id number]  DOB: Feb 02, 1951 Age: 73 y.o. 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  Post-Op Diagnosis: See above  Procedure: Left Modified Radical Neck Dissection - CPT 38724-LT  Surgeon: Eldora Blanch, MD  Anesthesia type:  General  Anesthesiologist: Anesthesiologist: Erma Thom SAUNDERS, MD; Corinne Garnette BRAVO, MD CRNA: Jerl Donald LABOR, CRNA; Caraher, Josette ORN, CRNA; Arvell Edsel CHRISTELLA, CRNA; Mannie Krystal LABOR, CRNA   Staff: Circulator: Bari Birmingham, RN Relief Circulator: Con Murriel CHRISTELLA, RN Relief Scrub: Evaline Riggs; Koleen Ripple D, CST Scrub Person: Shona No T, RN Circulator Assistant: Shona Jon POUR, RN  Implants: None  Specimens: ID Type Source Tests Collected by Time Destination  1 : Level 2, 3 and 4 left neck ENT Neck, Left SURGICAL PATHOLOGY Blanch Eldora NOVAK, MD 07/05/2024 2233     EBL:  250 mL  Drains: 19 Fr suction Drain  Post-op disposition and condition: PACU, hemodynamically stable  Findings: Very large left neck mass, with essentially complete compression of the internal jugular vein which was quite adherent but not invaded grossly by the mass. Removed, without evidence of gross extranodal extension. Level 2-4 neck dissection performed.  Complications: None apparent  Indications and consent:  Howard Simmons is a 73 y.o. male with diagnoses above. The patient's options were discussed, including risks/benefits/alternatives for each option. Patient expressed understanding, and despite these risks, consented and decided to proceed with above procedures. Informed consent was signed before proceeding. He understood he may still need chemotherapy despite neck dissection  Procedure:  The patient was identified in the preoperative area, consent confirmed, transported to the operating  suite. They were transferred to the operating room table and placed in a supine position. After induction of general endotracheal anesthesia and establishment of airway, a surgeon initiated time out was performed.  The patient was then padded and draped appropriately.  No paralytic was administered. Intra-operative antibiotics and steroids were administered.   The patient was prepped and draped in the usual fashion for this procedure. A marking pen was used to mark a incision in the left neck  in the direction of relaxed skin tension. The incision line was injected with 8 cc of 1% lidocaine  with 1:100,000 epinephrine .    A sharp knife was used to incise through the skin and dermis. Subplatysmal flaps were raised superiorly and inferiorly up to the mandibular angle and sternal notch.   The mass was readily palpable overlying right level 2. The left submandibular gland was identified and the facial vein was identified immediately posterior to the gland and was ligated. The posterior belly of the digastric muscle was then identified and followed posteriorly towards the mastoid, creating an alley/plane on the medial surface of the mass. The anterior border of the sternocleidomastoid was then skeletonized. The mass was very large, and occupying essentially the entire level 2. It was first separated off the sternocleidomastoid laterally using McCabe dissector and bipolar cautery. The spinal accessory nerve was then identified which the mass had displaced to be deeper, and then dissected gently and dissected off the mass. Superiorly, the mass was almost up to the level of C1 and using retraction, was gently dissected superiorly. The mass was then freed from the floor of the neck at the level of the deep cervical fascia. Cervical rootlets were identified. More inferiorly, the sternocleidomastoid was skeletonized and the omohyoid identified and  this inferior triangle dissected, and the omohyoid retracted. This marked  the inferior border of our dissection. Medially, the carotid artery and internal jugular vein were identified, and this marked the medial border of our dissection. The internal jugular vein was then dissected superiorly, but was very adherent though not fixed to the mass, most prominently more superiorly. This was attempted to be dissected off. During dissection, a small part was violated and thus given how thin the venous lumen appeared to be, decision was made to sacrifice the vein in order to avoid losing control of the vein more superiorly. The vein was first dissected more inferiorly and cross clamped and divided. It was tied with 2-0 silk sutures and several hemoclips. The mass was then dissected off the vein and freed superiorly and then circumferentially. With the mass freed, this was passed off the field as specimen.  The hypoglossal nerve was identified and not violated during the dissection, and so was the carotid artery. Next, the remaining nodal contents between the aforementioned borders were dissected off the jugular vein, floor of the neck and more laterally along the sternocleidomastoid and spinal accessory nerve. This packet was passed off en bloc. The spinal accessory nerve, carotid artery and vagus nerve were preserved. Hemostasis was achieved with bipolar cautery.   The neck compartments were then irrigated, suctioned, and valsalva performed. No chyle leak was identified. Hemostasis was adequate. Evarrest was placed in the wound bed. 19-Fr Suction drain were placed in the neck and brought out through the skin, securing it with silk suture. The neck was then closed in layers, using 3-0 Vicryl for the platysma, 4-0 Vicryl sutures for the dermis and staples for the skin.    All instruments were removed, skin cleansed and bacitracin  ointment applied. This concluded the procedure. Counts were correct at end of procedure. Care was then turned over to anesthesia team who extubated the patient and  transported to pacu in stable condition  [name]

## 2024-07-07 DIAGNOSIS — Z9889 Other specified postprocedural states: Secondary | ICD-10-CM | POA: Diagnosis not present

## 2024-07-07 MED ORDER — OXYCODONE HCL 5 MG PO TABS: 5.0000 mg | ORAL_TABLET | ORAL | 0 refills | Status: AC | PRN

## 2024-07-07 MED ORDER — BACITRACIN ZINC 500 UNIT/GM EX OINT: TOPICAL_OINTMENT | CUTANEOUS | 0 refills | Status: AC

## 2024-07-07 MED ORDER — AMOXICILLIN-POT CLAVULANATE 875-125 MG PO TABS: 1.0000 | ORAL_TABLET | Freq: Two times a day (BID) | ORAL | 0 refills | Status: AC

## 2024-07-07 NOTE — Progress Notes (Signed)
 Discharge Nurse Summary: DC order noted per MD. DC RN at bedside with patient. Patient agreeable with discharge plan, states family will arrive soon for pickup. AVS printed/reviewed. PIV removed, skin intact. No DME needs. No home/TOC meds. CP/Edu resolved. Telemonitor not present on assessment. All belongings accounted for including drain care supplies. Wound CDI w/o bleeding or drainage. See LDAs. Patient wheeled downstairs for discharge by private auto.   Rosario EMERSON Lund, RN

## 2024-07-07 NOTE — Discharge Summary (Signed)
 Physician Discharge Summary  Patient ID: Howard Simmons MRN: [id number] DOB/AGE: 72-Nov-1952 72 y.o.  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 male who was admitted 07/05/2024 after left neck dissection for squamous cell carcinoma of unknown primary. They were brought to the operating room on 07/05/2024 and underwent the above named procedures. He did fairly well after surgery and was monitored in PACU, and then monitored on the floor. He met all his postoperative milestones by post operative day 2 and joint decision was made to discharge with the drain.  Physical Exam:  General: Awake and alert, no acute distress Neck c/d/I, slight blanching erythema/rudiness; drain in place with ss output, no evidence of hematoma today; Left marginal mandibular nerve slightly weak, otherwise tongue and shoulder movement as above  Respiratory: Respiratory effort is normal. Lungs clear to auscultation.  Recent vital signs:  Vitals:   07/06/24 2039 07/07/24 0632  BP: (!) 104/55 (!) 126/58  Pulse: 95 79  Resp: 18 18  Temp: 97.8 F (36.6 C) 97.8 F (36.6 C)  SpO2: 95% 97%    Recent laboratory studies:  Results for orders placed or performed during the hospital encounter of 07/05/24  Glucose, capillary   Collection Time: 07/05/24  1:22 PM  Result Value Ref Range   Glucose-Capillary 123 (H) 70 - 99 mg/dL  Basic metabolic panel per protocol   Collection Time: 07/05/24  1:37 PM  Result Value Ref Range   Sodium 139 135 - 145 mmol/L   Potassium 3.5 3.5 - 5.1 mmol/L   Chloride 105 98 - 111 mmol/L   CO2 19 (L) 22 - 32  mmol/L   Glucose, Bld 126 (H) 70 - 99 mg/dL   BUN 14 8 - 23 mg/dL   Creatinine, Ser 9.15 0.61 - 1.24 mg/dL   Calcium  8.9 8.9 - 10.3 mg/dL   GFR, Estimated >39 >39 mL/min   Anion gap 15 5 - 15  CBC per protocol   Collection Time: 07/05/24  1:37 PM  Result Value Ref Range   WBC 7.0 4.0 - 10.5 K/uL   RBC 4.52 4.22 - 5.81 MIL/uL   Hemoglobin 13.7 13.0 - 17.0 g/dL   HCT 58.6 60.9 - 47.9 %   MCV 91.4 80.0 - 100.0 fL   MCH 30.3 26.0 - 34.0 pg   MCHC 33.2 30.0 - 36.0 g/dL   RDW 86.4 88.4 - 84.4 %   Platelets 349 150 - 400 K/uL   nRBC 0.0 0.0 - 0.2 %  Glucose, capillary   Collection Time: 07/05/24  3:24 PM  Result Value Ref Range   Glucose-Capillary 96 70 - 99 mg/dL  Glucose, capillary   Collection Time: 07/05/24  5:41 PM  Result Value Ref Range   Glucose-Capillary 95 70 - 99 mg/dL  Glucose, capillary   Collection Time: 07/05/24  7:56 PM  Result Value Ref Range   Glucose-Capillary 109 (H) 70 - 99 mg/dL  Glucose, capillary   Collection Time: 07/05/24 10:35 PM  Result Value Ref Range   Glucose-Capillary 95 70 - 99 mg/dL    Discharge Medications:   Allergies as of 07/07/2024  Reactions   Lisinopril Other (See Comments)   cough   Ozempic (0.25 Or 0.5 Mg-dose) [semaglutide(0.25 Or 0.5mg -dos)] Other (See Comments)   Profound fatigue        Medication List     STOP taking these medications    amoxicillin  875 MG tablet Commonly known as: AMOXIL        TAKE these medications    acetaminophen  500 MG tablet Commonly known as: TYLENOL  Take 2 tablets (1,000 mg total) by mouth every 6 (six) hours.   albuterol  108 (90 Base) MCG/ACT inhaler Commonly known as: VENTOLIN  HFA Inhale 1-2 puffs into the lungs every 6 (six) hours as needed for wheezing or shortness of breath.   amoxicillin -clavulanate 875-125 MG tablet Commonly known as: AUGMENTIN  Take 1 tablet by mouth 2 (two) times daily for 10 days.   aspirin EC 81 MG tablet Take 1 tablet (81 mg total) by mouth  daily. Start taking on: July 13, 2024 What changed: These instructions start on July 13, 2024. If you are unsure what to do until then, ask your doctor or other care provider.   atorvastatin  20 MG tablet Commonly known as: LIPITOR Take 20 mg by mouth daily.   bacitracin  ointment Apply to incision twice daily for 7 days, then switch to vaseline   cetirizine 10 MG tablet Commonly known as: ZYRTEC Take 10 mg by mouth daily.   DULoxetine  60 MG capsule Commonly known as: CYMBALTA  Take 60 mg by mouth daily.   Farxiga 10 MG Tabs tablet Generic drug: dapagliflozin propanediol Take 10 mg by mouth daily.   fluticasone  50 MCG/ACT nasal spray Commonly known as: FLONASE  Place 1 spray into both nostrils daily.   ibuprofen  200 MG tablet Commonly known as: ADVIL  Take 2 tablets (400 mg total) by mouth every 6 (six) hours as needed for mild pain (pain score 1-3).   lidocaine  2 % solution Commonly known as: XYLOCAINE  10ml gargle/swallow every 4-6 hours as needed; Duration: 7 days   losartan  50 MG tablet Commonly known as: COZAAR  Take 50 mg by mouth daily.   metFORMIN 500 MG 24 hr tablet Commonly known as: GLUCOPHAGE-XR Take 1,000 mg by mouth 2 (two) times daily.   nystatin 100000 UNIT/ML suspension Commonly known as: MYCOSTATIN Swish 5 ml for 5 minutes and spit out three times a day   omeprazole  20 MG capsule Commonly known as: PRILOSEC  Take 20 mg by mouth daily.   oxyCODONE  5 MG immediate release tablet Commonly known as: Roxicodone  Take 1 tablet (5 mg total) by mouth every 4 (four) hours as needed for up to 5 days.   sodium fluoride 1.1 % Gel dental gel Commonly known as: FLUORISHIELD After completion of HNRT, start using with fluoride trays nightly. Wear for 20 minutes   tadalafil 20 MG tablet Commonly known as: CIALIS Take 20 mg by mouth daily as needed for erectile dysfunction.   Vitamin D3 50 MCG (2000 UT) Chew Chew 2,000 Units by mouth daily.         Diagnostic Studies: No results found.  Disposition:  There are no questions and answers to display.             Signed: [name] 07/07/2024, 6:57 AM

## 2024-07-10 ENCOUNTER — Encounter (INDEPENDENT_AMBULATORY_CARE_PROVIDER_SITE_OTHER): Payer: Self-pay | Admitting: Physician Assistant

## 2024-07-10 ENCOUNTER — Ambulatory Visit (INDEPENDENT_AMBULATORY_CARE_PROVIDER_SITE_OTHER): Admitting: Physician Assistant

## 2024-07-10 VITALS — BP 119/73 | HR 82 | Temp 98.1°F

## 2024-07-10 DIAGNOSIS — Z08 Encounter for follow-up examination after completed treatment for malignant neoplasm: Secondary | ICD-10-CM

## 2024-07-10 DIAGNOSIS — C4442 Squamous cell carcinoma of skin of scalp and neck: Secondary | ICD-10-CM

## 2024-07-10 NOTE — Progress Notes (Unsigned)
 Dear Dr. Teresa, Here is my assessment for our mutual patient, Howard Simmons. Thank you for allowing me the opportunity to care for your patient. Please do not hesitate to contact me should you have any other questions. Sincerely, Chyrl Cohen PA-C  Otolaryngology Clinic Note Referring provider: Dr. Teresa HPI:  Howard Simmons is a 73 y.o. male kindly referred by Dr. Teresa   Discussed the use of AI scribe software for clinical note transcription with the patient, who gave verbal consent to proceed.  History of Present Illness    Howard Simmons is a 73 year old male who presents for follow-up after evaluation status post left modified radical neck dissection by Dr. Tobie on 07/05/2024.   He notes he is doing well, he notes some ongoing pain on the left lateral neck .He was discharged with a drain in place, which has been causing discomfort and pain, especially when attempting to dress or sleep. The drain has been producing approximately 20-25 cc of fluid per day, and he emptied it this morning.  He denies any fever, swelling, redness.  He experiences significant pain if he goes without his pain medication for an extended period. He wakes up in considerable pain, which improves after taking his medication. He is currently using oxycodone  for pain management.  He reports ongoing weakness with his left lower lip, as well as some minimal weakness with left shoulder abduction.  No fevers. He mentions that his voice feels different, though others have commented that he sounds better. He is practicing facial exercises to improve muscle function.    Independent Review of Additional Tests or Records:  Op note 07/05/2024  FINAL MICROSCOPIC DIAGNOSIS:   A. LYMPH NODES, LEVEL 2, 3 AND 4, LEFT NECK, EXCISION:  - Metastatic squamous cell carcinoma to 1 of 15 lymph nodes   PMH/Meds/All/SocHx/FamHx/ROS:   Past Medical History:  Diagnosis Date   Anxiety 04/13/2024   Cancer (HCC) 05/2024    squamous cell of head and neck region, unknown primary   COPD (chronic obstructive pulmonary disease) (HCC) 04/07/2022   Depression 04/28/2024   Diabetes mellitus without complication (HCC) 01/13/2024   type 2   ED (erectile dysfunction)    on cialis   GERD (gastroesophageal reflux disease)    Hyperlipidemia    Hypertension    Pneumonia 10/29/2021   right lower lobe   Seasonal allergies    Sleep apnea    mild , does not use cpap     Past Surgical History:  Procedure Laterality Date   COLON SURGERY     COLONOSCOPY WITH PROPOFOL  N/A 12/02/2015   Procedure: COLONOSCOPY WITH PROPOFOL ;  Surgeon: Gladis MARLA Louder, MD;  Location: [facility];  Service: Endoscopy;  Laterality: N/A;   DIRECT LARYNGOSCOPY N/A 06/14/2024   Procedure: LARYNGOSCOPY, DIRECT,  WITH BIOPSY;  Surgeon: Tobie Eldora NOVAK, MD;  Location: [facility] OR;  Service: ENT;  Laterality: N/A;   EYE SURGERY Bilateral    cataracts   IR US  GUIDE BX ASP/DRAIN  05/31/2024   RADICAL NECK DISSECTION Left 07/05/2024   Procedure: DISSECTION, NECK, RADICAL;  Surgeon: Tobie Eldora NOVAK, MD;  Location: [facility] OR;  Service: ENT;  Laterality: Left;   TONSILLECTOMY Bilateral 06/14/2024   Procedure: TONSILLECTOMY;  Surgeon: Tobie Eldora NOVAK, MD;  Location: [facility] OR;  Service: ENT;  Laterality: Bilateral;    Family History  Problem Relation Age of Onset   Heart disease Mother    Stroke Mother    Cancer Father    Cancer Sister  COPD Brother    Heart disease Sister    COPD Sister      Social Connections: Moderately Integrated (07/06/2024)   Social Connection and Isolation Panel    Frequency of Communication with Friends and Family: Twice a week    Frequency of Social Gatherings with Friends and Family: Twice a week    Attends Religious Services: 1 to 4 times per year    Active Member of [employer] or Organizations: No    Attends Banker Meetings: Never    Marital Status: Married      Current Outpatient Medications:    acetaminophen   (TYLENOL ) 500 MG tablet, Take 2 tablets (1,000 mg total) by mouth every 6 (six) hours., Disp: 100 tablet, Rfl: 2   albuterol  (VENTOLIN  HFA) 108 (90 Base) MCG/ACT inhaler, Inhale 1-2 puffs into the lungs every 6 (six) hours as needed for wheezing or shortness of breath., Disp: , Rfl:    amoxicillin -clavulanate (AUGMENTIN ) 875-125 MG tablet, Take 1 tablet by mouth 2 (two) times daily for 10 days., Disp: 20 tablet, Rfl: 0   [START ON 07/13/2024] aspirin EC 81 MG tablet, Take 1 tablet (81 mg total) by mouth daily., Disp: , Rfl:    atorvastatin  (LIPITOR) 20 MG tablet, Take 20 mg by mouth daily., Disp: , Rfl:    bacitracin  ointment, Apply to incision twice daily for 7 days, then switch to vaseline, Disp: 30 g, Rfl: 0   cetirizine (ZYRTEC) 10 MG tablet, Take 10 mg by mouth daily., Disp: , Rfl:    Cholecalciferol  (VITAMIN D3) 50 MCG (2000 UT) CHEW, Chew 2,000 Units by mouth daily., Disp: , Rfl:    DULoxetine  (CYMBALTA ) 60 MG capsule, Take 60 mg by mouth daily., Disp: , Rfl:    FARXIGA 10 MG TABS tablet, Take 10 mg by mouth daily., Disp: , Rfl:    fluticasone  (FLONASE ) 50 MCG/ACT nasal spray, Place 1 spray into both nostrils daily., Disp: , Rfl:    ibuprofen  (ADVIL ) 200 MG tablet, Take 2 tablets (400 mg total) by mouth every 6 (six) hours as needed for mild pain (pain score 1-3)., Disp: 30 tablet, Rfl: 0   lidocaine  (XYLOCAINE ) 2 % solution, 10ml gargle/swallow every 4-6 hours as needed; Duration: 7 days, Disp: , Rfl:    losartan  (COZAAR ) 50 MG tablet, Take 50 mg by mouth daily., Disp: , Rfl:    metFORMIN (GLUCOPHAGE-XR) 500 MG 24 hr tablet, Take 1,000 mg by mouth 2 (two) times daily., Disp: , Rfl:    nystatin (MYCOSTATIN) 100000 UNIT/ML suspension, Swish 5 ml for 5 minutes and spit out three times a day, Disp: , Rfl:    omeprazole  (PRILOSEC ) 20 MG capsule, Take 20 mg by mouth daily., Disp: , Rfl:    oxyCODONE  (ROXICODONE ) 5 MG immediate release tablet, Take 1 tablet (5 mg total) by mouth every 4 (four)  hours as needed for up to 5 days., Disp: 30 tablet, Rfl: 0   sodium fluoride (FLUORISHIELD) 1.1 % GEL dental gel, After completion of HNRT, start using with fluoride trays nightly. Wear for 20 minutes, Disp: , Rfl:    tadalafil (CIALIS) 20 MG tablet, Take 20 mg by mouth daily as needed for erectile dysfunction., Disp: , Rfl:    Physical Exam:   BP 119/73   Pulse 82   Temp 98.1 F (36.7 C)   SpO2 93%   Pertinent Findings  Slight weakness of left lower lip Decreased abduction left shoulder Left neck incision with staples in place, incision clean dry  and intact, no surrounding erythema, no fluctuance, no warmth to touch, drain in place with scant serous drainage No respiratory distress or stridor  Physical Exam   GENERAL: Alert, cooperative, well developed, no acute distress. HEENT: Normocephalic, normal oropharynx, moist mucous membranes. CHEST: Clear to auscultation bilaterally, no wheezes, rhonchi, or crackles. CARDIOVASCULAR: Normal heart rate and rhythm, S1 and S2 normal without murmurs. ABDOMEN: Soft, non-tender, non-distended, without organomegaly, normal bowel sounds. EXTREMITIES: No cyanosis or edema. NEUROLOGICAL: Cranial nerves grossly intact except for left facial droop with weakness on the left side. Moves all extremities without gross motor or sensory deficit, motor function normal, able to raise arms.       Seprately Identifiable Procedures:  None  Impression & Plans:  Howard Simmons is a 73 y.o. male with the following   Assessment and Plan  73 year old male status post left next dissection-  Dr. Tobie had the opportunity to evaluate the patient and discussed his pathology results with him.  Which included lymph nodes significant metastatic squamous cell carcinoma to one of the 15 lymph nodes.  Overall he appears to be healing appropriately no signs of infection.  He had less than 20 cc of drainage over the last 24 hours and scant amount of drainage in the bulb  today.  I removed this without difficulty.  He will begin applying Vaseline to his incision.  I like to see him back in the office in 1 week for staple removal.  He was given strict return precautions.  Both the patient, his wife, and his daughter verbalized understanding and agreement to today's plan had no further questions or concerns.           - f/u 1 week sooner as needed   Thank you for allowing me the opportunity to care for your patient. Please do not hesitate to contact me should you have any other questions.  Sincerely, Chyrl Cohen PA-C [facility] Phone: [phone] Fax: [phone]  07/10/2024, 2:58 PM

## 2024-07-11 LAB — SURGICAL PATHOLOGY

## 2024-07-17 ENCOUNTER — Encounter (INDEPENDENT_AMBULATORY_CARE_PROVIDER_SITE_OTHER): Payer: Self-pay | Admitting: Physician Assistant

## 2024-07-17 ENCOUNTER — Ambulatory Visit (INDEPENDENT_AMBULATORY_CARE_PROVIDER_SITE_OTHER): Admitting: Physician Assistant

## 2024-07-17 VITALS — BP 123/73 | HR 87 | Temp 97.8°F

## 2024-07-17 DIAGNOSIS — Z08 Encounter for follow-up examination after completed treatment for malignant neoplasm: Secondary | ICD-10-CM

## 2024-07-17 DIAGNOSIS — C4442 Squamous cell carcinoma of skin of scalp and neck: Secondary | ICD-10-CM

## 2024-07-17 NOTE — Progress Notes (Signed)
 Dear Dr. Teresa, Here is my assessment for our mutual patient, Benji Poynter. Thank you for allowing me the opportunity to care for your patient. Please do not hesitate to contact me should you have any other questions. Sincerely, Chyrl Cohen PA-C  Otolaryngology Clinic Note Referring provider: Dr. Teresa HPI:  Howard Simmons is a 73 y.o. male kindly referred by Dr. Teresa Starleen NAHIEM DREDGE is a 73 year old male who presents for follow-up after evaluation status post left modified radical neck dissection by Dr. Tobie on 07/05/2024.   He was last seen in the office on 07/10/2024.  At that time his drain was removed.  He has been doing well since.  He notes ongoing weakness at the left lower lip and shoulder.  No fevers, no redness, no drainage, no swelling, no other significant complaints today.   Independent Review of Additional Tests or Records:  None   PMH/Meds/All/SocHx/FamHx/ROS:   Past Medical History:  Diagnosis Date   Anxiety 04/13/2024   Cancer (HCC) 05/2024   squamous cell of head and neck region, unknown primary   COPD (chronic obstructive pulmonary disease) (HCC) 04/07/2022   Depression 04/28/2024   Diabetes mellitus without complication (HCC) 01/13/2024   type 2   ED (erectile dysfunction)    on cialis   GERD (gastroesophageal reflux disease)    Hyperlipidemia    Hypertension    Pneumonia 10/29/2021   right lower lobe   Seasonal allergies    Sleep apnea    mild , does not use cpap     Past Surgical History:  Procedure Laterality Date   COLON SURGERY     COLONOSCOPY WITH PROPOFOL  N/A 12/02/2015   Procedure: COLONOSCOPY WITH PROPOFOL ;  Surgeon: Gladis MARLA Louder, MD;  Location: [facility];  Service: Endoscopy;  Laterality: N/A;   DIRECT LARYNGOSCOPY N/A 06/14/2024   Procedure: LARYNGOSCOPY, DIRECT,  WITH BIOPSY;  Surgeon: Tobie Eldora NOVAK, MD;  Location: [facility] OR;  Service: ENT;  Laterality: N/A;   EYE SURGERY Bilateral    cataracts   IR US  GUIDE BX ASP/DRAIN   05/31/2024   RADICAL NECK DISSECTION Left 07/05/2024   Procedure: DISSECTION, NECK, RADICAL;  Surgeon: Tobie Eldora NOVAK, MD;  Location: [facility] OR;  Service: ENT;  Laterality: Left;   TONSILLECTOMY Bilateral 06/14/2024   Procedure: TONSILLECTOMY;  Surgeon: Tobie Eldora NOVAK, MD;  Location: [facility] OR;  Service: ENT;  Laterality: Bilateral;    Family History  Problem Relation Age of Onset   Heart disease Mother    Stroke Mother    Cancer Father    Cancer Sister    COPD Brother    Heart disease Sister    COPD Sister      Social Connections: Moderately Integrated (07/06/2024)   Social Connection and Isolation Panel    Frequency of Communication with Friends and Family: Twice a week    Frequency of Social Gatherings with Friends and Family: Twice a week    Attends Religious Services: 1 to 4 times per year    Active Member of [employer] or Organizations: No    Attends Banker Meetings: Never    Marital Status: Married      Current Outpatient Medications:    acetaminophen  (TYLENOL ) 500 MG tablet, Take 2 tablets (1,000 mg total) by mouth every 6 (six) hours., Disp: 100 tablet, Rfl: 2   albuterol  (VENTOLIN  HFA) 108 (90 Base) MCG/ACT inhaler, Inhale 1-2 puffs into the lungs every 6 (six) hours as needed for wheezing or  shortness of breath., Disp: , Rfl:    amoxicillin -clavulanate (AUGMENTIN ) 875-125 MG tablet, Take 1 tablet by mouth 2 (two) times daily for 10 days., Disp: 20 tablet, Rfl: 0   aspirin EC 81 MG tablet, Take 1 tablet (81 mg total) by mouth daily., Disp: , Rfl:    atorvastatin  (LIPITOR) 20 MG tablet, Take 20 mg by mouth daily., Disp: , Rfl:    bacitracin  ointment, Apply to incision twice daily for 7 days, then switch to vaseline, Disp: 30 g, Rfl: 0   cetirizine (ZYRTEC) 10 MG tablet, Take 10 mg by mouth daily., Disp: , Rfl:    Cholecalciferol  (VITAMIN D3) 50 MCG (2000 UT) CHEW, Chew 2,000 Units by mouth daily., Disp: , Rfl:    DULoxetine  (CYMBALTA ) 60 MG capsule, Take 60 mg by  mouth daily., Disp: , Rfl:    FARXIGA 10 MG TABS tablet, Take 10 mg by mouth daily., Disp: , Rfl:    fluticasone  (FLONASE ) 50 MCG/ACT nasal spray, Place 1 spray into both nostrils daily., Disp: , Rfl:    ibuprofen  (ADVIL ) 200 MG tablet, Take 2 tablets (400 mg total) by mouth every 6 (six) hours as needed for mild pain (pain score 1-3)., Disp: 30 tablet, Rfl: 0   lidocaine  (XYLOCAINE ) 2 % solution, 10ml gargle/swallow every 4-6 hours as needed; Duration: 7 days, Disp: , Rfl:    losartan  (COZAAR ) 50 MG tablet, Take 50 mg by mouth daily., Disp: , Rfl:    metFORMIN (GLUCOPHAGE-XR) 500 MG 24 hr tablet, Take 1,000 mg by mouth 2 (two) times daily., Disp: , Rfl:    nystatin (MYCOSTATIN) 100000 UNIT/ML suspension, Swish 5 ml for 5 minutes and spit out three times a day, Disp: , Rfl:    omeprazole  (PRILOSEC ) 20 MG capsule, Take 20 mg by mouth daily., Disp: , Rfl:    sodium fluoride (FLUORISHIELD) 1.1 % GEL dental gel, After completion of HNRT, start using with fluoride trays nightly. Wear for 20 minutes, Disp: , Rfl:    tadalafil (CIALIS) 20 MG tablet, Take 20 mg by mouth daily as needed for erectile dysfunction., Disp: , Rfl:    Physical Exam:   BP 123/73   Pulse 87   Temp 97.8 F (36.6 C)   SpO2 93%   Pertinent Findings  Slight weakness of left lower lip Decreased abduction left shoulder Left neck incision with staples in place, incision clean dry and intact, no surrounding erythema, no fluctuance, no warmth to touch No respiratory distress or stridor Physical Exam     Seprately Identifiable Procedures:  None  Impression & Plans:  Howard Simmons is a 73 y.o. male with the following   Assessment and Plan Assessment & Plan   Howard Simmons is a 73 year old male who presents for follow-up after evaluation status post left modified radical neck dissection by Dr. Tobie on 07/05/2024.-   No signs of infection, healing well.  Staples removed.  Instructed to place Vaseline on the incision.   Follow-up PRN.     - f/u PRN   Thank you for allowing me the opportunity to care for your patient. Please do not hesitate to contact me should you have any other questions.  Sincerely, Chyrl Cohen PA-C [facility] Phone: [phone] Fax: [phone]  07/17/2024, 3:27 PM

## 2024-07-24 DIAGNOSIS — K141 Geographic tongue: Secondary | ICD-10-CM | POA: Diagnosis not present

## 2024-07-24 DIAGNOSIS — C760 Malignant neoplasm of head, face and neck: Secondary | ICD-10-CM | POA: Diagnosis not present

## 2024-07-24 DIAGNOSIS — K0530 Chronic periodontitis, unspecified: Secondary | ICD-10-CM | POA: Diagnosis not present

## 2024-07-24 DIAGNOSIS — R590 Localized enlarged lymph nodes: Secondary | ICD-10-CM | POA: Diagnosis not present

## 2024-07-24 DIAGNOSIS — Z91843 Risk for dental caries, high: Secondary | ICD-10-CM | POA: Diagnosis not present

## 2024-07-24 DIAGNOSIS — K029 Dental caries, unspecified: Secondary | ICD-10-CM | POA: Diagnosis not present

## 2024-07-24 DIAGNOSIS — C7989 Secondary malignant neoplasm of other specified sites: Secondary | ICD-10-CM | POA: Diagnosis not present

## 2024-07-24 DIAGNOSIS — B370 Candidal stomatitis: Secondary | ICD-10-CM | POA: Diagnosis not present

## 2024-07-24 DIAGNOSIS — K08409 Partial loss of teeth, unspecified cause, unspecified class: Secondary | ICD-10-CM | POA: Diagnosis not present

## 2024-08-01 ENCOUNTER — Ambulatory Visit
Admission: RE | Admit: 2024-08-01 | Discharge: 2024-08-01 | Disposition: A | Discharge status: Home / Self Care | Source: Ambulatory Visit | Attending: Physician Assistant | Admitting: Physician Assistant

## 2024-08-01 ENCOUNTER — Ambulatory Visit: Admission: RE | Admit: 2024-08-01 | Discharge: 2024-08-01 | Attending: Radiation Oncology

## 2024-08-01 VITALS — BP 135/75 | HR 107 | Temp 97.8°F | Resp 20 | Ht 72.0 in | Wt 208.6 lb

## 2024-08-01 DIAGNOSIS — Z1329 Encounter for screening for other suspected endocrine disorder: Secondary | ICD-10-CM | POA: Diagnosis not present

## 2024-08-01 DIAGNOSIS — Z510 Encounter for antineoplastic radiation therapy: Secondary | ICD-10-CM | POA: Insufficient documentation

## 2024-08-01 DIAGNOSIS — C801 Malignant (primary) neoplasm, unspecified: Secondary | ICD-10-CM | POA: Insufficient documentation

## 2024-08-01 DIAGNOSIS — C4442 Squamous cell carcinoma of skin of scalp and neck: Secondary | ICD-10-CM | POA: Insufficient documentation

## 2024-08-01 DIAGNOSIS — Z01812 Encounter for preprocedural laboratory examination: Secondary | ICD-10-CM | POA: Diagnosis not present

## 2024-08-01 DIAGNOSIS — C770 Secondary and unspecified malignant neoplasm of lymph nodes of head, face and neck: Secondary | ICD-10-CM | POA: Insufficient documentation

## 2024-08-01 NOTE — Progress Notes (Signed)
 Has armband been applied?  No.  Does patient have an allergy to IV contrast dye?: No.   Has patient ever received premedication for IV contrast dye?: No.   Date of lab work: 07/05/2024 BUN: 14 CR: 0.84 eGFR: >60  Does patient take metformin?: Yes.    Is eGFR >60?: Yes.   If no, when can patient resume? (Must be 48 hrs AFTER they receive IV contrast):    IV site: Left AC  There were no vitals taken for this visit.

## 2024-08-01 NOTE — Progress Notes (Signed)
 Oncology Nurse Navigator Documentation   To provide support, encouragement and care continuity, met with Mr. Anglin before his CT SIM. He was accompanied by his wife.  He tolerated procedure without difficulty, denied questions/concerns.    I encouraged him to call me prior to 08/14/24  [place].   Delon Jefferson RN, BSN, OCN Head & Neck Oncology Nurse Navigator [facility] Phone # [phone]  Fax # [phone]

## 2024-08-04 ENCOUNTER — Other Ambulatory Visit: Payer: Self-pay

## 2024-08-04 DIAGNOSIS — C4442 Squamous cell carcinoma of skin of scalp and neck: Secondary | ICD-10-CM

## 2024-08-07 ENCOUNTER — Ambulatory Visit: Admitting: Radiation Oncology

## 2024-08-07 ENCOUNTER — Telehealth: Payer: Self-pay

## 2024-08-07 DIAGNOSIS — Z510 Encounter for antineoplastic radiation therapy: Secondary | ICD-10-CM | POA: Diagnosis not present

## 2024-08-07 NOTE — Telephone Encounter (Signed)
 [facility] Clinical Social Work  Clinical Social Work was referred by statistician for assessment of psychosocial needs.  Clinical Social Worker contacted patient by phone to offer support and assess for needs Patient denied any immediate needs aside from cost of treatment. CSW referred patient to billing department to apply for financial assistance. SABRA Lizbeth Sprague, LCSW  Clinical Social Worker [facility]

## 2024-08-08 ENCOUNTER — Ambulatory Visit

## 2024-08-09 ENCOUNTER — Ambulatory Visit

## 2024-08-14 ENCOUNTER — Ambulatory Visit

## 2024-08-14 ENCOUNTER — Ambulatory Visit
Admission: RE | Admit: 2024-08-14 | Discharge: 2024-08-14 | Disposition: A | Discharge status: Home / Self Care | Source: Ambulatory Visit | Attending: Radiation Oncology | Admitting: Radiation Oncology

## 2024-08-14 ENCOUNTER — Other Ambulatory Visit: Payer: Self-pay

## 2024-08-14 DIAGNOSIS — Z510 Encounter for antineoplastic radiation therapy: Secondary | ICD-10-CM | POA: Diagnosis not present

## 2024-08-14 DIAGNOSIS — C4442 Squamous cell carcinoma of skin of scalp and neck: Secondary | ICD-10-CM

## 2024-08-14 LAB — RAD ONC ARIA SESSION SUMMARY
Course Elapsed Days: 0
Plan Fractions Treated to Date: 1
Plan Prescribed Dose Per Fraction: 2 Gy
Plan Total Fractions Prescribed: 30
Plan Total Prescribed Dose: 60 Gy
Reference Point Dosage Given to Date: 2 Gy
Reference Point Session Dosage Given: 2 Gy
Session Number: 1

## 2024-08-14 NOTE — Progress Notes (Signed)
 Oncology Nurse Navigator Documentation   To provide support, encouragement and care continuity, met with Howard Simmons for his initial RT.  He was accompanied by his wife. I reviewed the 2-step treatment process, answered questions.  Howard Simmons completed treatment without difficulty, denied questions/concerns. I reviewed the registration/arrival procedure for subsequent treatments. I encouraged them to call me with questions/concerns as treatments proceed.   Delon Jefferson RN, BSN, OCN Head & Neck Oncology Nurse Navigator [facility] Phone # [phone]  Fax # [phone]

## 2024-08-15 ENCOUNTER — Ambulatory Visit
Admission: RE | Admit: 2024-08-15 | Discharge: 2024-08-15 | Disposition: A | Discharge status: Home / Self Care | Source: Ambulatory Visit | Attending: Radiation Oncology | Admitting: Radiation Oncology

## 2024-08-15 ENCOUNTER — Ambulatory Visit: Admission: RE | Admit: 2024-08-15 | Discharge: 2024-08-15 | Attending: Radiation Oncology

## 2024-08-15 ENCOUNTER — Other Ambulatory Visit: Payer: Self-pay

## 2024-08-15 DIAGNOSIS — C4442 Squamous cell carcinoma of skin of scalp and neck: Secondary | ICD-10-CM

## 2024-08-15 DIAGNOSIS — Z510 Encounter for antineoplastic radiation therapy: Secondary | ICD-10-CM | POA: Diagnosis not present

## 2024-08-15 LAB — RAD ONC ARIA SESSION SUMMARY
Course Elapsed Days: 1
Plan Fractions Treated to Date: 2
Plan Prescribed Dose Per Fraction: 2 Gy
Plan Total Fractions Prescribed: 30
Plan Total Prescribed Dose: 60 Gy
Reference Point Dosage Given to Date: 4 Gy
Reference Point Session Dosage Given: 2 Gy
Session Number: 2

## 2024-08-15 LAB — TSH: TSH: 1.68 u[IU]/mL (ref 0.350–4.500)

## 2024-08-16 ENCOUNTER — Other Ambulatory Visit: Payer: Self-pay

## 2024-08-16 ENCOUNTER — Ambulatory Visit
Admission: RE | Admit: 2024-08-16 | Discharge: 2024-08-16 | Disposition: A | Discharge status: Home / Self Care | Source: Ambulatory Visit | Attending: Radiation Oncology | Admitting: Radiation Oncology

## 2024-08-16 DIAGNOSIS — Z510 Encounter for antineoplastic radiation therapy: Secondary | ICD-10-CM | POA: Diagnosis not present

## 2024-08-16 LAB — RAD ONC ARIA SESSION SUMMARY
Course Elapsed Days: 2
Plan Fractions Treated to Date: 3
Plan Prescribed Dose Per Fraction: 2 Gy
Plan Total Fractions Prescribed: 30
Plan Total Prescribed Dose: 60 Gy
Reference Point Dosage Given to Date: 6 Gy
Reference Point Session Dosage Given: 2 Gy
Session Number: 3

## 2024-08-18 ENCOUNTER — Inpatient Hospital Stay: Attending: Radiation Oncology | Admitting: Nutrition

## 2024-08-18 ENCOUNTER — Ambulatory Visit
Admission: RE | Admit: 2024-08-18 | Discharge: 2024-08-18 | Disposition: A | Discharge status: Home / Self Care | Source: Ambulatory Visit | Attending: Physician Assistant | Admitting: Physician Assistant

## 2024-08-18 ENCOUNTER — Other Ambulatory Visit: Payer: Self-pay

## 2024-08-18 DIAGNOSIS — C4492 Squamous cell carcinoma of skin, unspecified: Secondary | ICD-10-CM | POA: Insufficient documentation

## 2024-08-18 DIAGNOSIS — Z510 Encounter for antineoplastic radiation therapy: Secondary | ICD-10-CM | POA: Diagnosis present

## 2024-08-18 LAB — RAD ONC ARIA SESSION SUMMARY
Course Elapsed Days: 4
Plan Fractions Treated to Date: 4
Plan Prescribed Dose Per Fraction: 2 Gy
Plan Total Fractions Prescribed: 30
Plan Total Prescribed Dose: 60 Gy
Reference Point Dosage Given to Date: 8 Gy
Reference Point Session Dosage Given: 2 Gy
Session Number: 4

## 2024-08-18 NOTE — Progress Notes (Signed)
 74 year old male diagnosed with SCC of the head and neck with unknown primary, p16 positive.  Status post bilateral tonsillectomies and scheduled for radiation therapy which started on December 29 with final radiation treatment scheduled for February 9.  Patient is followed by Dr. Izell and Dr. Autumn.  Past medical history includes hypertension, COPD, OSA, GERD, diabetes type 2, hyperlipidemia, vitamin D  deficiency, major depression, diverticulosis.  Medications include vitamin D  3, Glucophage XR, Prilosec .  Labs include glucose 126  Height: 6 feet 0 inches. Weight: 208 pounds 9.6 ounces December 16-BMI 28.29. 222 pounds October 29  6% weight loss in less than 3 months/concerning.  Spoke with patient and wife over the telephone.  Patient reports unintentional weight loss after his tonsils were removed.  States he dropped down to 198 pounds.  He is now regaining some of that weight.  Reports he is eating pretty well.  He has noticed some taste alterations.  He is also concerned that he is starting to feel increased fatigue.  Wonders if his taste buds will permanently be damaged and if he will always have dry mouth.  Feels early satiety.  Nutrition diagnosis: Unintended weight loss related to head and neck cancer and associated treatments as evidenced by 6% weight loss.  Intervention: Recommended small, frequent meals and snacks with higher calorie, higher protein foods. Recommended trying oral nutrition supplement for added calories and protein Educated on strategies for improving taste alterations. Encouraged baking soda and salt water gargles frequently throughout the day and provided recipe. Provided support and encouragement. Left samples of oral nutrition supplements along with written facts sheets and RD contact information with radiation oncology to provide patient on Monday. Questions were answered.  Monitoring, evaluation, goals: Patient will tolerate adequate calories and  protein to minimize weight loss.  Next visit: Thursday, January 8 after radiation treatment.  Patient and wife confirmed appointment.  **Disclaimer: This note was dictated with voice recognition software. Similar sounding words can inadvertently be transcribed and this note may contain transcription errors which may not have been corrected upon publication of note.**

## 2024-08-18 NOTE — Progress Notes (Deleted)
 74 year old  male diagnosed with SCC of the head and neck with unknown primary, p16 positive.  Status post bilateral tonsillectomies and scheduled for radiation therapy which started on December 29 with final treatment scheduled for February 9.  Patient is followed by Dr. Izell and Dr. Autumn.  Past medical history includes hypertension, COPD, OSA, GERD, diabetes type 2, hyperlipidemia, vitamin D  deficiency, major depression, diverticulosis.  Medications include vitamin D  3, Glucophage XR, Prilosec .  Labs include glucose 126  Height: 6 feet 0 inches. Weight:  208 pounds 9.6 ounces December 16 - BMI 28.29 222 pounds October 29  6% weight loss in less than 3 months/concerning  Spoke with patient and wife over the telephone.  Patient reports unintentional weight loss after his tonsils were removed.  States he dropped down to 198 pounds.  He is now regaining some of that weight.  Reports he is eating pretty well.  He has noticed some taste alterations.  He is also concerned that he is starting to feel increased fatigue.  Wonders if his taste buds will be permanently damaged and if he will always have dry mouth.  Feels some early satiety.  Nutrition diagnosis: Unintended weight loss related to head and neck cancer and associated treatments as evidenced by 6% weight loss.  Intervention: Recommended small, frequent meals and snacks with higher calorie, higher protein foods. Recommended trying an oral nutrition supplement for added calories and protein. Educated on strategies for improving taste alterations. Encouraged baking soda and salt water gargle and provided recipe. Provided support and encouragement. Left samples of oral nutrition supplements along with written fact sheets and RD contact information with radiation oncology to provide to patient on Monday. Questions answered.  Monitoring, evaluation, goals: Patient will tolerate adequate calories and protein to minimize weight  loss.  Next visit: Thursday, January 8 after radiation treatment.  Both patient and wife confirm appointment.  **Disclaimer: This note was dictated with voice recognition software. Similar sounding words can inadvertently be transcribed and this note may contain transcription errors which may not have been corrected upon publication of note.**

## 2024-08-21 ENCOUNTER — Ambulatory Visit

## 2024-08-21 ENCOUNTER — Other Ambulatory Visit: Payer: Self-pay

## 2024-08-21 ENCOUNTER — Ambulatory Visit
Admission: RE | Admit: 2024-08-21 | Discharge: 2024-08-21 | Disposition: A | Discharge status: Home / Self Care | Source: Ambulatory Visit | Attending: Radiation Oncology | Admitting: Radiation Oncology

## 2024-08-21 DIAGNOSIS — C4492 Squamous cell carcinoma of skin, unspecified: Secondary | ICD-10-CM

## 2024-08-21 DIAGNOSIS — Z510 Encounter for antineoplastic radiation therapy: Secondary | ICD-10-CM | POA: Diagnosis not present

## 2024-08-21 LAB — RAD ONC ARIA SESSION SUMMARY
Course Elapsed Days: 7
Plan Fractions Treated to Date: 5
Plan Prescribed Dose Per Fraction: 2 Gy
Plan Total Fractions Prescribed: 30
Plan Total Prescribed Dose: 60 Gy
Reference Point Dosage Given to Date: 10 Gy
Reference Point Session Dosage Given: 2 Gy
Session Number: 5

## 2024-08-21 MED ORDER — SONAFINE EX EMUL
1.0000 | Freq: Once | CUTANEOUS | Status: AC
  Administered 2024-08-21: 1 via TOPICAL

## 2024-08-21 MED ORDER — LIDOCAINE VISCOUS HCL 2 % MT SOLN: OROMUCOSAL | 3 refills | Status: AC

## 2024-08-22 ENCOUNTER — Ambulatory Visit: Admission: RE | Admit: 2024-08-22 | Discharge: 2024-08-22 | Attending: Radiation Oncology

## 2024-08-22 ENCOUNTER — Other Ambulatory Visit: Payer: Self-pay

## 2024-08-22 DIAGNOSIS — Z510 Encounter for antineoplastic radiation therapy: Secondary | ICD-10-CM | POA: Diagnosis not present

## 2024-08-22 LAB — RAD ONC ARIA SESSION SUMMARY
Course Elapsed Days: 8
Plan Fractions Treated to Date: 6
Plan Prescribed Dose Per Fraction: 2 Gy
Plan Total Fractions Prescribed: 30
Plan Total Prescribed Dose: 60 Gy
Reference Point Dosage Given to Date: 12 Gy
Reference Point Session Dosage Given: 2 Gy
Session Number: 6

## 2024-08-23 ENCOUNTER — Other Ambulatory Visit: Payer: Self-pay

## 2024-08-23 ENCOUNTER — Ambulatory Visit
Admission: RE | Admit: 2024-08-23 | Discharge: 2024-08-23 | Disposition: A | Discharge status: Home / Self Care | Source: Ambulatory Visit | Attending: Radiation Oncology | Admitting: Radiation Oncology

## 2024-08-23 DIAGNOSIS — Z510 Encounter for antineoplastic radiation therapy: Secondary | ICD-10-CM | POA: Diagnosis not present

## 2024-08-23 LAB — RAD ONC ARIA SESSION SUMMARY
Course Elapsed Days: 9
Plan Fractions Treated to Date: 7
Plan Prescribed Dose Per Fraction: 2 Gy
Plan Total Fractions Prescribed: 30
Plan Total Prescribed Dose: 60 Gy
Reference Point Dosage Given to Date: 14 Gy
Reference Point Session Dosage Given: 2 Gy
Session Number: 7

## 2024-08-24 ENCOUNTER — Inpatient Hospital Stay: Admitting: Nutrition

## 2024-08-24 ENCOUNTER — Other Ambulatory Visit: Payer: Self-pay

## 2024-08-24 ENCOUNTER — Ambulatory Visit
Admission: RE | Admit: 2024-08-24 | Discharge: 2024-08-24 | Disposition: A | Discharge status: Home / Self Care | Source: Ambulatory Visit | Attending: Radiation Oncology | Admitting: Radiation Oncology

## 2024-08-24 DIAGNOSIS — Z510 Encounter for antineoplastic radiation therapy: Secondary | ICD-10-CM | POA: Diagnosis not present

## 2024-08-24 LAB — RAD ONC ARIA SESSION SUMMARY
Course Elapsed Days: 10
Plan Fractions Treated to Date: 8
Plan Prescribed Dose Per Fraction: 2 Gy
Plan Total Fractions Prescribed: 30
Plan Total Prescribed Dose: 60 Gy
Reference Point Dosage Given to Date: 16 Gy
Reference Point Session Dosage Given: 2 Gy
Session Number: 8

## 2024-08-24 NOTE — Progress Notes (Signed)
 Nutrition follow-up completed with patient, wife, and daughter.  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  He is followed by Dr. Izell and Dr. Autumn.  Weight: 208.4 pounds January 8 208 pounds 9.6 ounces December 16 222 pounds October 29  Patient feels well.  He is experiencing some taste alterations.  Reports food tasting like cardboard.  Currently denies other nutrition impact symptoms and is eating without difficulty.  He has tried some oral nutrition supplements and tolerated them okay.  He does not particularly love the taste but he will drink them.  He has used baking soda and salt water gargles 2 or 3 times daily.  Offered additional ONS samples, however patient politely declined.  Nutrition diagnosis: Unintended weight loss is stable.  Intervention: Recommended patient continue baking soda and salt water gargles but add prior to eating. Consider 1 oral nutrition supplement daily to provide additional calories and protein. Discouraged weight loss and education provided on concerns with malnutrition. Provided coupons.  Answered questions.  Monitoring, evaluation, goals: Patient will tolerate adequate calories and protein to minimize weight loss.  Next visit: Thursday, January 15, after radiation therapy.  **Disclaimer: This note was dictated with voice recognition software. Similar sounding words can inadvertently be transcribed and this note may contain transcription errors which may not have been corrected upon publication of note.**

## 2024-08-25 ENCOUNTER — Ambulatory Visit
Admission: RE | Admit: 2024-08-25 | Discharge: 2024-08-25 | Disposition: A | Source: Ambulatory Visit | Attending: Radiation Oncology

## 2024-08-25 ENCOUNTER — Other Ambulatory Visit: Payer: Self-pay

## 2024-08-25 DIAGNOSIS — Z510 Encounter for antineoplastic radiation therapy: Secondary | ICD-10-CM | POA: Diagnosis not present

## 2024-08-25 LAB — RAD ONC ARIA SESSION SUMMARY
Course Elapsed Days: 11
Plan Fractions Treated to Date: 9
Plan Prescribed Dose Per Fraction: 2 Gy
Plan Total Fractions Prescribed: 30
Plan Total Prescribed Dose: 60 Gy
Reference Point Dosage Given to Date: 18 Gy
Reference Point Session Dosage Given: 2 Gy
Session Number: 9

## 2024-08-28 ENCOUNTER — Ambulatory Visit
Admission: RE | Admit: 2024-08-28 | Discharge: 2024-08-28 | Disposition: A | Source: Ambulatory Visit | Attending: Radiation Oncology

## 2024-08-28 ENCOUNTER — Ambulatory Visit: Admission: RE | Admit: 2024-08-28 | Discharge: 2024-08-28 | Attending: Radiation Oncology

## 2024-08-28 ENCOUNTER — Other Ambulatory Visit: Payer: Self-pay

## 2024-08-28 ENCOUNTER — Telehealth (INDEPENDENT_AMBULATORY_CARE_PROVIDER_SITE_OTHER): Payer: Self-pay | Admitting: Otolaryngology

## 2024-08-28 DIAGNOSIS — Z510 Encounter for antineoplastic radiation therapy: Secondary | ICD-10-CM | POA: Diagnosis not present

## 2024-08-28 LAB — RAD ONC ARIA SESSION SUMMARY
Course Elapsed Days: 14
Plan Fractions Treated to Date: 10
Plan Prescribed Dose Per Fraction: 2 Gy
Plan Total Fractions Prescribed: 30
Plan Total Prescribed Dose: 60 Gy
Reference Point Dosage Given to Date: 20 Gy
Reference Point Session Dosage Given: 2 Gy
Session Number: 10

## 2024-08-28 NOTE — Telephone Encounter (Signed)
 Left message for patient to call back to schedule follow-up appointment with Dr. Tobie in July after treatment.

## 2024-08-29 ENCOUNTER — Other Ambulatory Visit: Payer: Self-pay

## 2024-08-29 ENCOUNTER — Ambulatory Visit
Admission: RE | Admit: 2024-08-29 | Discharge: 2024-08-29 | Disposition: A | Source: Ambulatory Visit | Attending: Radiation Oncology

## 2024-08-29 DIAGNOSIS — Z510 Encounter for antineoplastic radiation therapy: Secondary | ICD-10-CM | POA: Diagnosis not present

## 2024-08-29 LAB — RAD ONC ARIA SESSION SUMMARY
Course Elapsed Days: 15
Plan Fractions Treated to Date: 11
Plan Prescribed Dose Per Fraction: 2 Gy
Plan Total Fractions Prescribed: 30
Plan Total Prescribed Dose: 60 Gy
Reference Point Dosage Given to Date: 22 Gy
Reference Point Session Dosage Given: 2 Gy
Session Number: 11

## 2024-08-30 ENCOUNTER — Ambulatory Visit
Admission: RE | Admit: 2024-08-30 | Discharge: 2024-08-30 | Disposition: A | Discharge status: Home / Self Care | Source: Ambulatory Visit | Attending: Radiation Oncology | Admitting: Radiation Oncology

## 2024-08-30 ENCOUNTER — Other Ambulatory Visit: Payer: Self-pay

## 2024-08-30 DIAGNOSIS — Z510 Encounter for antineoplastic radiation therapy: Secondary | ICD-10-CM | POA: Diagnosis not present

## 2024-08-30 LAB — RAD ONC ARIA SESSION SUMMARY
Course Elapsed Days: 16
Plan Fractions Treated to Date: 12
Plan Prescribed Dose Per Fraction: 2 Gy
Plan Total Fractions Prescribed: 30
Plan Total Prescribed Dose: 60 Gy
Reference Point Dosage Given to Date: 24 Gy
Reference Point Session Dosage Given: 2 Gy
Session Number: 12

## 2024-08-31 ENCOUNTER — Ambulatory Visit: Admitting: Physical Therapy

## 2024-08-31 ENCOUNTER — Other Ambulatory Visit: Payer: Self-pay

## 2024-08-31 ENCOUNTER — Inpatient Hospital Stay: Admitting: Dietician

## 2024-08-31 ENCOUNTER — Ambulatory Visit
Admission: RE | Admit: 2024-08-31 | Discharge: 2024-08-31 | Disposition: A | Source: Ambulatory Visit | Attending: Radiation Oncology

## 2024-08-31 ENCOUNTER — Ambulatory Visit: Attending: Radiation Oncology

## 2024-08-31 ENCOUNTER — Encounter: Payer: Self-pay | Admitting: Physical Therapy

## 2024-08-31 DIAGNOSIS — M25512 Pain in left shoulder: Secondary | ICD-10-CM | POA: Insufficient documentation

## 2024-08-31 DIAGNOSIS — M25612 Stiffness of left shoulder, not elsewhere classified: Secondary | ICD-10-CM

## 2024-08-31 DIAGNOSIS — R293 Abnormal posture: Secondary | ICD-10-CM | POA: Diagnosis present

## 2024-08-31 DIAGNOSIS — C4442 Squamous cell carcinoma of skin of scalp and neck: Secondary | ICD-10-CM | POA: Diagnosis present

## 2024-08-31 DIAGNOSIS — Z510 Encounter for antineoplastic radiation therapy: Secondary | ICD-10-CM | POA: Diagnosis not present

## 2024-08-31 DIAGNOSIS — R1310 Dysphagia, unspecified: Secondary | ICD-10-CM | POA: Insufficient documentation

## 2024-08-31 LAB — RAD ONC ARIA SESSION SUMMARY
Course Elapsed Days: 17
Plan Fractions Treated to Date: 13
Plan Prescribed Dose Per Fraction: 2 Gy
Plan Total Fractions Prescribed: 30
Plan Total Prescribed Dose: 60 Gy
Reference Point Dosage Given to Date: 26 Gy
Reference Point Session Dosage Given: 2 Gy
Session Number: 13

## 2024-08-31 NOTE — Patient Instructions (Signed)
 SWALLOWING EXERCISES Do these 5-6 days/week until 6 months after your last day of radiation, then 2 days per week afterwards You can use 1-2 drops of liquid to help you swallow, if your mouth gets dry  Effortful Swallows - Press your tongue against the roof of your mouth for 3 seconds, then swallow as hard as you can - Do at least 20 reps/day, in sets of 5-10  Masako Swallow - swallow with your tongue sticking out - Stick tongue out past your lips and gently bite tongue with your teeth - Swallow, while holding your tongue with your teeth - Do at least 20 reps/day, in sets of 5-10   Shaker Exercise - head lift - Lie flat on your back in your bed, the floor, or a couch  - Raise your head and look at your feet - KEEP YOUR SHOULDERS DOWN - HOLD FOR 45-60 SECONDS, then lower your head back down - Repeat 3 times, 2-3 times a day  [employer] - "squeeze swallow" exercise - Swallow, and squeeze tight to keep your Adam's Apple up - Hold the squeeze for 5-7 seconds - then relax - Do at least 20 reps/day, in sets of 5-10  5.   Chin tuck - Place a rolled up towel (4 inches wide) under your chin near your neck - Tuck your chin and push hard on the towel for 5 seconds - Do at least 20 reps/day, in sets of 5-10

## 2024-08-31 NOTE — Progress Notes (Signed)
 Nutrition Follow-up:  Nutrition follow-up completed with patient, wife, and daughter. 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 He is followed by Dr. Izell and Dr. Autumn.   Met with patient and wife in office after radiation. Patient reports foods don't taste good. Sometimes it feels like he chewing a piece of cardboard. Patient reports worsening sore throat. Appetite and intake have declined in the last few days. Yesterday he had cup of mac/cheese and boiled potatoes with butter. He would like to weigh himself while here today. Wife reports patient reluctant to try foods she suggest (broth, creamy soups, mashed potatoes/gravy). Patient reports dry mouth. This is especially bothersome in the mornings. Says he doesn't drink enough water. Currently consuming 1 bottle of water, 3 diet Dr. Nunzio, and maybe one Ensure.   Medications: reviewed   Labs: no new labs   Anthropometrics: Wt 202.2 lb today in RD office   1/12 - 208.6 lb (aria) 1/5 - 206.0 lb   NUTRITION DIAGNOSIS: Unintended wt loss - continues    INTERVENTION:  Reinforced importance of adequate calorie/protein energy intake to maintain weights/strength and support healing post treatment  Recommend small meals/snacks q2-3 h - choosing soft smooth textures for ease of intake - Shake recipes + Soft moist high protein foods list Continue baking soda salt water gargles, suggested rinsing prior to po Pt will work to increase intake of water and decrease diet Dr. Nunzio Recommend 2-3 Ensure Plus/equivalent + samples, coupons, CIB powder samples provided  Pt has completed 13/30 planned treatments - given poor po + NIS strongly encouraged pt to consider feeding tube  MONITORING, EVALUATION, GOAL: wt trends, intake   NEXT VISIT: Thursday January 22 after RT with [facility]

## 2024-08-31 NOTE — Therapy (Signed)
 " OUTPATIENT SPEECH LANGUAGE PATHOLOGY ONCOLOGY EVALUATION   Patient Name: Howard Simmons MRN: [id number] DOB:16-Feb-1951, 74 y.o., male Today's Date: 08/31/2024  PCP: Teresa Channel, MD REFERRING PROVIDER: Izell Domino, MD  END OF SESSION:  End of Session - 08/31/24 2339     Visit Number 1    Number of Visits 4    Date for Recertification  11/29/24    SLP Start Time 1215    SLP Stop Time  1252    SLP Time Calculation (min) 37 min    Activity Tolerance Patient tolerated treatment well          Past Medical History:  Diagnosis Date   Anxiety 04/13/2024   Cancer (HCC) 05/2024   squamous cell of head and neck region, unknown primary   COPD (chronic obstructive pulmonary disease) (HCC) 04/07/2022   Depression 04/28/2024   Diabetes mellitus without complication (HCC) 01/13/2024   type 2   ED (erectile dysfunction)    on cialis   GERD (gastroesophageal reflux disease)    Hyperlipidemia    Hypertension    Pneumonia 10/29/2021   right lower lobe   Seasonal allergies    Sleep apnea    mild , does not use cpap   Past Surgical History:  Procedure Laterality Date   COLON SURGERY     COLONOSCOPY WITH PROPOFOL  N/A 12/02/2015   Procedure: COLONOSCOPY WITH PROPOFOL ;  Surgeon: Gladis MARLA Louder, MD;  Location: [facility];  Service: Endoscopy;  Laterality: N/A;   DIRECT LARYNGOSCOPY N/A 06/14/2024   Procedure: LARYNGOSCOPY, DIRECT,  WITH BIOPSY;  Surgeon: Tobie Eldora NOVAK, MD;  Location: [facility];  Service: ENT;  Laterality: N/A;   EYE SURGERY Bilateral    cataracts   IR US  GUIDE BX ASP/DRAIN  05/31/2024   RADICAL NECK DISSECTION Left 07/05/2024   Procedure: DISSECTION, NECK, RADICAL;  Surgeon: Tobie Eldora NOVAK, MD;  Location: [facility] OR;  Service: ENT;  Laterality: Left;   TONSILLECTOMY Bilateral 06/14/2024   Procedure: TONSILLECTOMY;  Surgeon: Tobie Eldora NOVAK, MD;  Location: [facility] OR;  Service: ENT;  Laterality: Bilateral;   Patient Active Problem List   Diagnosis Date Noted    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) 07/06/2024   Status post neck dissection 07/05/2024   Squamous cell carcinoma of head and neck 06/14/2024   Head and neck cancer (HCC) 06/11/2024   Squamous cell carcinoma of head and neck region 06/09/2024   Allergic rhinitis due to pollen 04/10/2024   Anxiety 04/10/2024   Atherosclerosis of aorta 04/10/2024   Benign prostatic hyperplasia with lower urinary tract symptoms 04/10/2024   Erectile dysfunction 04/10/2024   Hyperlipidemia 04/10/2024   Major depression in partial remission 04/10/2024   Obstructive sleep apnea 04/10/2024   Vitamin D  deficiency 04/10/2024   History of hearing loss 05/06/2023   Adenovirus infection 10/31/2021   GERD (gastroesophageal reflux disease) 10/30/2021   Right lower lobe pneumonia 10/29/2021   DM2 (diabetes mellitus, type 2) (HCC) 10/29/2021   Generalized weakness 10/29/2021   Falls 10/29/2021   Hyponatremia 10/29/2021   COPD (chronic obstructive pulmonary disease) (HCC) 10/29/2021   HTN (hypertension) 10/29/2021   Macular rash 10/29/2021   Encounter for orthopedic follow-up care 08/03/2019   Pain due to onychomycosis of toenails of both feet 03/17/2019   Osteoarthritis of carpometacarpal (CMC) joint of thumb 12/06/2017   Pain in thumb joint with movement of left hand 12/06/2017   Pain in thumb joint with movement of right hand 12/06/2017  ONSET DATE: SEE PERTINENT HISTORY BELOW  REFERRING DIAG:  C44.42 (ICD-10-CM) - Squamous cell carcinoma of head and neck region     THERAPY DIAG:  Dysphagia, unspecified type  Rationale for Evaluation and Treatment: Rehabilitation  SUBJECTIVE:   SUBJECTIVE STATEMENT: You're going to have to help me remember these. (Pt, to wife, re: HEP)  Pt accompanied by: self  PERTINENT HISTORY:  Squamous cell of the left neck with unknown primary, stage 1 (T0 N1 M0 p 16 +). 04/13/24 He presented to his PCP with left sided  cervical lymphadenopathy at the left angle of the jaw.  He was treated with antibiotics and also tested positive for COVID. 05/08/24 CT neck demonstrated a 4.5 x 3.4 cm ovoid soft lesion within the left upper neck (at the level 2 station) favoring an enlarged lymph node, and completely effacing the left internal jugular vein within the mid/upper neck. 05/19/24 PET demonstrated an oval-shaped soft tissue mass in the left neck at the left level 2A nodal position measuring 2.8 x 3.5 cm with an SUV max of 14.1. Imaging otherwise showed no additional areas of hypermetabolism in the neck or chest that would suggest a primary site. He was referred by his PCP to our Rapid Diagnostic clinic at [facility] who ordered a biopsy which was done on 05/31/24 showing findings consistent with SCC, p 16 +. 06/07/24 He was seen by Dr. Tobie (ENT) and he recommended proceeding with DL/Bx first along with tonsillectomy. This was performed on 06/14/24 with negative biopsies. After discussing with Dr. Tobie he elected to undergo neck dissection. 07/05/24 Left modified radical neck dissection showing SCC without extra nodal extension by Dr. Tobie. He will proceed with radiation alone. He did consult with Dr. Izell and Dr. Autumn on 06/09/24 and after biopsies radiation alone was recommended. 06/29/24 PET showed no vocal cord primary or hypermetabolic metastases identified. Scan also noted a focus of hypermetabolism either along the capsule of the caudate lobe or an adjacent diminutive node is felt unlikely to represent metastatic disease. given history of multiple primaries, this could be further evaluated with pre-and postcontrast abdominal MRI. 07/04/24 Consult with Dr. Izell. He will receive radiation alone to his vocal cord. He also had dental extractions with Dr. Danial on 07/24/24. Teeth # 15 and 18.Treatment plan: He will receive 30 fractions of radiation to his Oropharynx and bilateral neck which started on 08/14/24 and  complete 09/25/24. Pretreatment procedures: 07/05/24 Left modified radical neck dissection. 07/24/24 Extraction of teeth # 15 and 18  PAIN:  Are you having pain? Yes: NPRS scale: 2/10 Pain location: throat Pain description: sore Aggravating factors: swallowing Relieving factors: meds  FALLS: Has patient fallen in last 6 months?  See PT evaluation for details  LIVING ENVIRONMENT: Lives with: lives with their spouse Lives in: House/apartment  PLOF:  Level of assistance: Independent with ADLs, Independent with IADLs Employment: Retired  PATIENT GOALS: Keep WNL swallowing after rad tx  OBJECTIVE:  Note: Objective measures were completed at Evaluation unless otherwise noted. DIAGNOSTIC FINDINGS: See pertinent history above  INSTRUMENTAL SWALLOW STUDY FINDINGS (MBSS) none noted in chart at this time   COGNITION: Overall cognitive status: History of cognitive impairments - at baseline  LANGUAGE: Receptive and Expressive language appeared WNL.  ORAL MOTOR EXAMINATION: Overall status: Impaired: Labial: Right (Symmetry) Comments: Pain on lt when lingual ROM to rt  MOTOR SPEECH: Overall motor speech: Appears intact Respiration: thoracic breathing Phonation: normal Resonance: WFL Articulation: Appears intact Intelligibility: Intelligible  SUBJECTIVE DYSPHAGIA REPORTS:  Date of onset: none Reported symptoms: none reported  Current diet: Dysphagia 3 (mechanical soft), Dysphagia 1 (puree), and thin liquids - limited due to pain and dysgeusia  Co-morbid voice changes: No  FACTORS WHICH MAY INCREASE RISK OF ADVERSE EVENT IN PRESENCE OF ASPIRATION:  General health: well appearing  Risk factors: undergoing RT for cancer  CLINICAL SWALLOW ASSESSMENT:   Dentition: adequate natural dentition Vocal quality at baseline: normal Patient directly observed with POs: Yes: dysphagia 1 (puree)  Feeding: able to feed self Liquids provided by: cup Oral phase signs and symptoms: None noted  today Pharyngeal phase signs and symptoms: None noted today                                                                                                                            TREATMENT DATE:   08/31/24: Research states the risk for dysphagia increases due to radiation and/or chemotherapy treatment due to a variety of factors, so SLP educated the pt about the possibility of reduced/limited ability for PO intake during rad tx. SLP also educated pt regarding possible changes to swallowing musculature after rad tx, and why adherence to dysphagia HEP provided today and PO consumption was necessary to inhibit muscle fibrosis following rad tx and to mitigate muscle disuse atrophy. SLP informed pt why this would be detrimental to their swallowing status and to their pulmonary health. Pt demonstrated understanding of these things to SLP. SLP encouraged pt to safely eat and drink as deep into their radiation/chemotherapy as possible to provide the best possible long-term swallowing outcome for pt.  SLP then developed an individualized HEP for pt involving oral and pharyngeal strengthening and ROM and pt was instructed how to perform these exercises, including SLP demonstration. After SLP demonstration, pt return demonstrated each exercise. SLP ensured pt performance was correct prior to educating pt on next exercise. Pt required models and then occasional min-mod cues faded to modified independent to perform HEP. Pt was instructed to complete this program at least 5 days a week, at 20-30 reps a day (except Shaker at 6 reps a day) until 6 months after his or her last day of rad tx, and then x2 a week after that, indefinitely. Among other modifications for days when pt cannot functionally swallow, SLP also suggested pt to perform only non-swallowing tasks on the handout/HEP, and if necessary to cycle through the swallowing portion so the full program of exercises can be completed instead of fatiguing on one of the  swallowing exercises and being unable to perform the other swallowing exercises. SLP instructed that swallowing exercises should then be added back into the regimen as pt is able to do so.   PATIENT EDUCATION: Education details: late effects head/neck radiation on swallow function, HEP procedure, and modification to HEP when difficulty experienced with swallowing during and after radiation course Person educated: Patient and Spouse Education method: Explanation, Demonstration, Verbal cues, and Handouts Education comprehension: verbalized understanding, returned demonstration, verbal cues  required, and needs further education   ASSESSMENT:  CLINICAL IMPRESSION: Patient is a 74 y.o. M who was seen today for assessment of swallowing as they undergo radiation/chemoradiation therapy. Today pt ate applesauce and drank thin liquids without overt s/s oral or pharyngeal difficulty. At this time pt swallowing is deemed WNL/WFL with these POs. There are no overt s/s aspiration PNA observed by SLP nor any reported by pt at this time. Data indicate that pt's swallow ability will likely decrease over the course of radiation/chemoradiation therapy and could very well decline over time following the conclusion of that therapy due to muscle disuse atrophy and/or muscle fibrosis. Pt will need to be seen regularly by SLP in order to assess safety of PO intake, assess the need for any objective swallow assessment, and ensuring pt is correctly completing the individualized HEP.  OBJECTIVE IMPAIRMENTS: include dysphagia. These impairments are limiting patient from safety when swallowing. Factors affecting potential to achieve goals and functional outcome are anxiety(?). Patient will benefit from skilled SLP services to address above impairments and improve overall function.  REHAB POTENTIAL: Good   GOALS: Goals reviewed with patient? No    SHORT TERM GOALS: Target: 3rd total session   1. Pt will complete HEP  with modified independence in 2 sessions Baseline: Goal status: INITIAL   2.  pt will tell SLP why pt is completing HEP with modified independence Baseline:  Goal status: INITIAL   3.  pt will demo understanding of at least 3 overt s/s aspiration PNA with modified independence Baseline:  Goal status: INITIAL   4.  pt will demo knowledge of how a food journal could hasten return to a more normalized diet Baseline:  Goal status: INITIAL     LONG TERM GOALS: Target: 7th total session   1.  pt will complete HEP with independence over two visits Baseline:  Goal status: INITIAL   2.  pt will describe how to modify HEP over time, and the timeline associated with reduction in HEP frequency with modified independence over two sessions Baseline:  Goal status: INITIAL     PLAN:   SLP FREQUENCY:  once approx every 4 weeks   SLP DURATION:  7 sessions   PLANNED INTERVENTIONS: Aspiration precaution training, Pharyngeal strengthening exercises, Diet toleration management , Trials of upgraded texture/liquids, SLP instruction and feedback, Compensatory strategies, and Patient/family education, [phone] (treatment of swallowing dysfunction and/or oral function for feeding)   Mitzy Naron, CCC-SLP 08/31/2024, 11:40 PM      "

## 2024-08-31 NOTE — Therapy (Signed)
 " OUTPATIENT PHYSICAL THERAPY HEAD AND NECK BASELINE EVALUATION   Patient Name: Howard Simmons MRN: [id number] DOB:1950-11-26, 74 y.o., male Today's Date: 08/31/2024  END OF SESSION:  PT End of Session - 08/31/24 1114     Visit Number 1    Number of Visits 9    Date for Recertification  09/28/24    PT Start Time 1110    PT Stop Time 1155    PT Time Calculation (min) 45 min    Activity Tolerance Patient tolerated treatment well    Behavior During Therapy [facility] for tasks assessed/performed          Past Medical History:  Diagnosis Date   Anxiety 04/13/2024   Cancer (HCC) 05/2024   squamous cell of head and neck region, unknown primary   COPD (chronic obstructive pulmonary disease) (HCC) 04/07/2022   Depression 04/28/2024   Diabetes mellitus without complication (HCC) 01/13/2024   type 2   ED (erectile dysfunction)    on cialis   GERD (gastroesophageal reflux disease)    Hyperlipidemia    Hypertension    Pneumonia 10/29/2021   right lower lobe   Seasonal allergies    Sleep apnea    mild , does not use cpap   Past Surgical History:  Procedure Laterality Date   COLON SURGERY     COLONOSCOPY WITH PROPOFOL  N/A 12/02/2015   Procedure: COLONOSCOPY WITH PROPOFOL ;  Surgeon: Gladis MARLA Louder, MD;  Location: [facility];  Service: Endoscopy;  Laterality: N/A;   DIRECT LARYNGOSCOPY N/A 06/14/2024   Procedure: LARYNGOSCOPY, DIRECT,  WITH BIOPSY;  Surgeon: Tobie Eldora NOVAK, MD;  Location: [facility] OR;  Service: ENT;  Laterality: N/A;   EYE SURGERY Bilateral    cataracts   IR US  GUIDE BX ASP/DRAIN  05/31/2024   RADICAL NECK DISSECTION Left 07/05/2024   Procedure: DISSECTION, NECK, RADICAL;  Surgeon: Tobie Eldora NOVAK, MD;  Location: [facility] OR;  Service: ENT;  Laterality: Left;   TONSILLECTOMY Bilateral 06/14/2024   Procedure: TONSILLECTOMY;  Surgeon: Tobie Eldora NOVAK, MD;  Location: [facility] OR;  Service: ENT;  Laterality: Bilateral;   Patient Active Problem List   Diagnosis Date Noted    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) 07/06/2024   Status post neck dissection 07/05/2024   Squamous cell carcinoma of head and neck 06/14/2024   Head and neck cancer (HCC) 06/11/2024   Squamous cell carcinoma of head and neck region 06/09/2024   Allergic rhinitis due to pollen 04/10/2024   Anxiety 04/10/2024   Atherosclerosis of aorta 04/10/2024   Benign prostatic hyperplasia with lower urinary tract symptoms 04/10/2024   Erectile dysfunction 04/10/2024   Hyperlipidemia 04/10/2024   Major depression in partial remission 04/10/2024   Obstructive sleep apnea 04/10/2024   Vitamin D  deficiency 04/10/2024   History of hearing loss 05/06/2023   Adenovirus infection 10/31/2021   GERD (gastroesophageal reflux disease) 10/30/2021   Right lower lobe pneumonia 10/29/2021   DM2 (diabetes mellitus, type 2) (HCC) 10/29/2021   Generalized weakness 10/29/2021   Falls 10/29/2021   Hyponatremia 10/29/2021   COPD (chronic obstructive pulmonary disease) (HCC) 10/29/2021   HTN (hypertension) 10/29/2021   Macular rash 10/29/2021   Encounter for orthopedic follow-up care 08/03/2019   Pain due to onychomycosis of toenails of both feet 03/17/2019   Osteoarthritis of carpometacarpal (CMC) joint of thumb 12/06/2017   Pain in thumb joint with movement of left hand 12/06/2017   Pain in thumb joint with movement of right  hand 12/06/2017    PCP: Montie Pizza, MD   REFERRING PROVIDER: Lauraine Golden, MD  REFERRING DIAG: [phone] (ICD-10-CM) - Squamous cell carcinoma of head and neck region  THERAPY DIAG:  Abnormal posture  Stiffness of left shoulder, not elsewhere classified  Acute pain of left shoulder  Squamous cell carcinoma of head and neck  Rationale for Evaluation and Treatment: Rehabilitation  ONSET DATE: 07/05/24  SUBJECTIVE:     SUBJECTIVE STATEMENT: Patient reports they are here today to be seen by their medical team for newly diagnosed  cancer of head and neck.    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 04/13/24 He presented to his PCP with left sided cervical lymphadenopathy at the left angle of the jaw.  He was treated with antibiotics and also tested positive for COVID. 05/08/24 CT neck demonstrated a 4.5 x 3.4 cm ovoid soft lesion within the left upper neck (at the level 2 station) favoring an enlarged lymph node, and completely effacing the left internal jugular vein within the mid/upper neck. 05/19/24 PET demonstrated an oval-shaped soft tissue mass in the left neck at the left level 2A nodal position measuring 2.8 x 3.5 cm with an SUV max of 14.1. Imaging otherwise showed no additional areas of hypermetabolism in the neck or chest that would suggest a primary site.  He was referred by his PCP to our Rapid Diagnostic clinic at [facility] who ordered a biopsy which was done on 05/31/24 showing findings consistent with SCC, p 16 +. 06/07/24 He was seen by Dr. Tobie (ENT) and he recommended proceeding with DL/Bx first along with tonsillectomy. This was performed on 06/14/24 with negative biopsies. After discussing with Dr. Tobie he elected to undergo neck dissection. 07/05/24 Left modified radical neck dissection showing SCC without extra nodal extension by Dr. Tobie. He will proceed with radiation alone. 06/29/24 PET showed no vocal cord primary or hypermetabolic metastases identified. Scan also noted a focus of hypermetabolism either along the capsule of the caudate lobe or an adjacent diminutive node is felt unlikely to represent metastatic disease. Given history of multiple primaries, this could be further evaluated with pre-and postcontrast abdominal MRI. 07/04/24 Consult with Dr. Golden. He will receive radiation alone to his vocal cord. He also had dental extractions with Dr. Danial on 07/24/24. Teeth # 15 and 18. He will receive 30 fractions of radiation to his Oropharynx  and bilateral neck which started on 08/14/24 and complete 09/25/24. 07/05/24 Left modified radical neck dissection  PATIENT GOALS:   to be educated about the signs and symptoms of lymphedema and learn post op HEP.   PAIN:  Are you having pain? Yes: NPRS scale: 6/10 Pain location: throat Pain description: L side Aggravating factors: when throat gets dry, talking Relieving factors: lidocaine   PRECAUTIONS: Active CA  RED FLAGS: None   WEIGHT BEARING RESTRICTIONS: No  FALLS:  Has patient fallen in last 6 months? No Does the patient have a fear of falling that limits activity? No Is the patient reluctant to leave the house due to a fear of falling?No  LIVING ENVIRONMENT: Patient lives with: wife Comer Lives in: House/apartment Has following equipment at home: handle on side of tub  OCCUPATION: retired  LEISURE: walking prior to diagnosis  PRIOR LEVEL OF FUNCTION: Independent   OBJECTIVE: Note: Objective measures were completed at Evaluation unless otherwise noted.  COGNITION: Overall cognitive status: Within functional limits for tasks assessed  POSTURE:  Forward head and rounded shoulders posture  30 SEC SIT TO STAND: 11 reps in 30 sec without use of UEs which is  Below average for patient's age  SHOULDER AROM:   Impaired  L shoulder ROM secondary to neck dissection surgery   CERVICAL AROM:   Percent limited  Flexion WFL  Extension 25% limited (pt reports this is baseline)  Right lateral flexion 50% limited  Left lateral flexion 50% limited  Right rotation 50% limited  Left rotation 50% limited    (Blank rows=not tested)   PATIENT EDUCATION:  Education details: Neck ROM, importance of posture when sitting, standing and lying down, deep breathing, walking program and importance of staying active throughout treatment, CURE article on staying active, Why exercise? flyer, lymphedema and PT info Person educated: Patient Education method:  Explanation, Demonstration, Handout Education comprehension: Patient verbalized understanding and returned demonstration  HOME EXERCISE PROGRAM: Patient was instructed today in a home exercise program today for head and neck range of motion exercises. These included active cervical flexion, active cervical extension, active cervical rotation to each direction, upper trap stretch, and shoulder retraction. Patient was encouraged to do these 2-3 times a day, holding for 5 sec each and completing for 5 reps. Pt was educated that once this becomes easier then hold the stretches for 30-60 seconds.    ASSESSMENT:  CLINICAL IMPRESSION: Pt arrives to PT with recently diagnosed head and neck cancer. He will receive 30 fractions of radiation to his Oropharynx and bilateral neck which started on 08/14/24 and complete 09/25/24.  Pt's cervical ROM was limited in all directions. His L shoulder ROM is limited secondary to neck dissection and he has pain in his left shoulder. Educated pt about signs and symptoms of lymphedema as well as anatomy and physiology of lymphatic system. Educated pt in importance of staying as active as possible throughout treatment to decrease fatigue as well as head and neck ROM exercises to decrease loss of ROM. Pt would benefit from skilled PT services to improve L shoulder ROM, decrease pain, improve neck dissection scar mobility and instruct pt in a home exercise for ROM, strength and posture.   Pt will benefit from skilled therapeutic intervention to improve on the following deficits: Decreased knowledge of precautions and postural dysfunction. Other deficits: decreased knowledge of condition, decreased knowledge of use of DME, decreased ROM, decreased strength, increased fascial restrictions, impaired UE functional use, postural dysfunction, and pain  PT treatment/interventions: ADL/self-care home management, pt/family education, therapeutic exercise. Other interventions [id number]- PT  Re-evaluation, 97750- Physical Performance Testing, 97110-Therapeutic exercises, 97530- Therapeutic activity, [id number]- Neuromuscular re-education, 97535- Self Care, [id number]- Manual therapy, 97760- Orthotic Initial, [phone]- Orthotic/Prosthetic subsequent, and Patient/Family education  REHAB POTENTIAL: Good  CLINICAL DECISION MAKING: Evolving/moderate complexity  EVALUATION COMPLEXITY: Moderate   GOALS: Goals reviewed with patient? YES  LONG TERM GOALS: (STG=LTG)   Name Target Date  Goal status  1 Patient will be able to verbalize understanding of a home exercise program for cervical range of motion, posture, and walking.   Baseline:  No knowledge 08/31/2024 Achieved at eval  2 Patient will be able to verbalize understanding of proper sitting and standing posture. Baseline:  No knowledge 08/31/2024 Achieved at eval  3 Patient will be able to verbalize understanding of lymphedema risk and availability of treatment for this condition Baseline:  No knowledge 08/31/2024 Achieved at eval  4 Pt will demonstrate a return to full cervical ROM and function post operatively compared to baselines and not demonstrate  any signs or symptoms of lymphedema.  Baseline: See objective measurements taken today. 09/28/24 New  5 Pt will demonstrate 155 degrees of L shoulder abduction to allow him to reach out to the side. 09/28/24  NEW  6 Pt will be independent in a home exercise program for continued stretching  and strengthening.  09/28/24 NEW    PLAN:  PT FREQUENCY/DURATION: 2x/wk for 4 wks  PLAN FOR NEXT SESSION: measure shoulder ROM and add to note, work on [facility] rhythm, do sidelying exercises - may need over pressure to stabilize scapular, discuss GH rhythm, give initial exercises     Physical Therapy Information for During and After Head/Neck Cancer Treatment: Lymphedema is a swelling condition that you may be at risk for in your neck and/or face if you have radiation treatment to the area and/or if you have  surgery that includes removing lymph nodes.  There is treatment available for this condition and it is not life-threatening.  Contact your physician or physical therapist with concerns. An excellent resource for those seeking information on lymphedema is the National Lymphedema Network's website.  It can be accessed at www.lymphnet.org If you notice swelling in your neck or face at any time following surgery (even if it is many years from now), please contact your doctor or physical therapist to discuss this.  Lymphedema can be treated at any time but it is easier for you if it is treated early on. If you have had surgery to your neck, please check with your surgeon about how soon to start doing neck range of motion exercises.  If you are not having surgery, I encourage you to start doing neck range of motion exercises today and continue these while undergoing treatment, UNLESS you have irritation of your skin or soft tissue that is aggravated by doing them.  These exercises are intended to help you prevent loss of range of motion and/or to gain range of motion in your neck (which can be limited by tightening effects of radiation), and NOT to aggravate these tissues if they develop sensitivities from treatment. Neck range of motion exercises should be done to the point of feeling a GENTLE, TOLERABLE stretch only.  You are encouraged to start a walking or other exercise program tomorrow and continue this as much as you are able through and after treatment.  Please feel free to call me with any questions. Florina Lanis Carbon, PT, CLT Physical Therapist and Certified Lymphedema Therapist [facility] [address], [place], [place] [address] [phone] [email or web address]  WALKING  Walking is a great form of exercise to increase your strength, endurance and overall fitness.  A walking program can help you start slowly and gradually build endurance as you  go.  Everyone's ability is different, so each person's starting point will be different.  You do not have to follow them exactly.  The are just samples. You should simply find out what's right for you and stick to that program.   In the beginning, you'll start off walking 2-3 times a day for short distances.  As you get stronger, you'll be walking further at just 1-2 times per day.  A. You Can Walk For A Certain Length Of Time Each Day    Walk 5 minutes 3 times per day.  Increase 2 minutes every 2 days (3 times per day).  Work up to 25-30 minutes (1-2 times per day).   Example:   Day 1-2 5 minutes 3 times per day  Day 7-8 12 minutes 2-3 times per day   Day 13-14 25 minutes 1-2 times per day  B. You Can Walk For a Certain Distance Each Day     Distance can be substituted for time.    Example:   3 trips to mailbox (at road)   3 trips to corner of block   3 trips around the block  C. Go to local high school and use the track.    Walk for distance ____ around track  Or time ____ minutes  D. Walk ____ Jog ____ Run ___   Why exercise?  So many benefits! Here are SOME of them: Heart health, including raising your good cholesterol level and reducing heart rate and blood pressure Lung health, including improved lung capacity It burns fats, and most of us  can stand to be leaner, whether or not we are overweight. It increases the body's natural painkillers and mood elevators, so makes you feel better. Not only makes you feel better, but look better too Improves sleep Takes a bite out of stress May decrease your risk of many types of cancer If you are currently undergoing cancer treatment, exercise may improve your ability to tolerate treatments including chemotherapy. For everybody, it can improve your energy level. Those with cancer-related fatigue report a 40-50% reduction in this symptom when exercising regularly. If you are a survivor of breast, colon, or prostate cancer, it may  decrease your risk of a recurrence. (This may hold for other cancers too, but so far we have data just for these three types.)  How to exercise: Get your doctor's okay. Pick something you enjoy doing, like walking, Zumba, biking, swimming, or whatever. Start at low intensity and time, then gradually increase.  (See walking program handout.) Set a goal to achieve over time.  The American Cancer Society, American Heart Association, and U.S. Dept. of Health and Human Services recommend 150 minutes of moderate exercise, 75 minutes of vigorous exercise, or a combination of both per week. This should be done in episodes at least 10 minutes long, spread throughout the week.  Need help being motivated? Pick something you enjoy doing, because you'll be more inclined to stick with that activity than something that feels like a chore. Do it with a friend so that you are accountable to each other. Schedule it into your day. Place it on your calendar and keep that appointment just like you do any appointment that you make. Join an exercise group that meets at a specific time.  That way, you have to show up on time, and that makes it harder to procrastinate about doing your workout.  It also keeps you accountable--people begin to expect you to be there. Join a gym where you feel comfortable and not intimidated, at the right cost. Sign up for something that you'll need to be in shape for on a specific date, like a 1K or a 5K to walk or run, a 20 or 30 mile bike ride, a mud run or something like that. If the date is looming, you know you'll need to train to be ready for it.  An added benefit is that many of these are fundraisers for good causes. If you've already paid for a gym membership, group exercise class or event, you might as well work out, so you haven't wasted your money!    [facility], PT 08/31/2024, 12:01 PM                       "

## 2024-09-01 ENCOUNTER — Ambulatory Visit
Admission: RE | Admit: 2024-09-01 | Discharge: 2024-09-01 | Disposition: A | Discharge status: Home / Self Care | Source: Ambulatory Visit | Attending: Radiation Oncology | Admitting: Radiation Oncology

## 2024-09-01 ENCOUNTER — Other Ambulatory Visit: Payer: Self-pay

## 2024-09-01 DIAGNOSIS — Z510 Encounter for antineoplastic radiation therapy: Secondary | ICD-10-CM | POA: Diagnosis not present

## 2024-09-01 LAB — RAD ONC ARIA SESSION SUMMARY
Course Elapsed Days: 18
Plan Fractions Treated to Date: 14
Plan Prescribed Dose Per Fraction: 2 Gy
Plan Total Fractions Prescribed: 30
Plan Total Prescribed Dose: 60 Gy
Reference Point Dosage Given to Date: 28 Gy
Reference Point Session Dosage Given: 2 Gy
Session Number: 14

## 2024-09-04 ENCOUNTER — Ambulatory Visit

## 2024-09-04 ENCOUNTER — Ambulatory Visit
Admission: RE | Admit: 2024-09-04 | Discharge: 2024-09-04 | Disposition: A | Source: Ambulatory Visit | Attending: Radiation Oncology

## 2024-09-04 ENCOUNTER — Other Ambulatory Visit: Payer: Self-pay

## 2024-09-04 VITALS — Wt 201.5 lb

## 2024-09-04 DIAGNOSIS — C4442 Squamous cell carcinoma of skin of scalp and neck: Secondary | ICD-10-CM

## 2024-09-04 DIAGNOSIS — R1310 Dysphagia, unspecified: Secondary | ICD-10-CM | POA: Diagnosis not present

## 2024-09-04 DIAGNOSIS — R293 Abnormal posture: Secondary | ICD-10-CM

## 2024-09-04 DIAGNOSIS — M25612 Stiffness of left shoulder, not elsewhere classified: Secondary | ICD-10-CM

## 2024-09-04 DIAGNOSIS — M25512 Pain in left shoulder: Secondary | ICD-10-CM

## 2024-09-04 DIAGNOSIS — Z510 Encounter for antineoplastic radiation therapy: Secondary | ICD-10-CM | POA: Diagnosis not present

## 2024-09-04 LAB — RAD ONC ARIA SESSION SUMMARY
Course Elapsed Days: 21
Plan Fractions Treated to Date: 15
Plan Prescribed Dose Per Fraction: 2 Gy
Plan Total Fractions Prescribed: 30
Plan Total Prescribed Dose: 60 Gy
Reference Point Dosage Given to Date: 30 Gy
Reference Point Session Dosage Given: 2 Gy
Session Number: 15

## 2024-09-04 NOTE — Patient Instructions (Addendum)
 SABRA

## 2024-09-04 NOTE — Therapy (Signed)
 " OUTPATIENT PHYSICAL THERAPY HEAD AND NECK TREATMENT   Patient Name: Howard Simmons MRN: [id number] DOB:03-Sep-1950, 74 y.o., male Today's Date: 09/04/2024  END OF SESSION:  PT End of Session - 09/04/24 1110     Visit Number 2    Number of Visits 9    Date for Recertification  09/28/24    PT Start Time 1103    PT Stop Time 1202    PT Time Calculation (min) 59 min    Activity Tolerance Patient tolerated treatment well    Behavior During Therapy [facility] for tasks assessed/performed          Past Medical History:  Diagnosis Date   Anxiety 04/13/2024   Cancer (HCC) 05/2024   squamous cell of head and neck region, unknown primary   COPD (chronic obstructive pulmonary disease) (HCC) 04/07/2022   Depression 04/28/2024   Diabetes mellitus without complication (HCC) 01/13/2024   type 2   ED (erectile dysfunction)    on cialis   GERD (gastroesophageal reflux disease)    Hyperlipidemia    Hypertension    Pneumonia 10/29/2021   right lower lobe   Seasonal allergies    Sleep apnea    mild , does not use cpap   Past Surgical History:  Procedure Laterality Date   COLON SURGERY     COLONOSCOPY WITH PROPOFOL  N/A 12/02/2015   Procedure: COLONOSCOPY WITH PROPOFOL ;  Surgeon: Gladis MARLA Louder, MD;  Location: [facility];  Service: Endoscopy;  Laterality: N/A;   DIRECT LARYNGOSCOPY N/A 06/14/2024   Procedure: LARYNGOSCOPY, DIRECT,  WITH BIOPSY;  Surgeon: Tobie Eldora NOVAK, MD;  Location: [facility] OR;  Service: ENT;  Laterality: N/A;   EYE SURGERY Bilateral    cataracts   IR US  GUIDE BX ASP/DRAIN  05/31/2024   RADICAL NECK DISSECTION Left 07/05/2024   Procedure: DISSECTION, NECK, RADICAL;  Surgeon: Tobie Eldora NOVAK, MD;  Location: [facility] OR;  Service: ENT;  Laterality: Left;   TONSILLECTOMY Bilateral 06/14/2024   Procedure: TONSILLECTOMY;  Surgeon: Tobie Eldora NOVAK, MD;  Location: 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 (HCC) 07/06/2024   Status post neck dissection 07/05/2024   Squamous cell carcinoma of head and neck 06/14/2024   Head and neck cancer (HCC) 06/11/2024   Squamous cell carcinoma of head and neck region 06/09/2024   Allergic rhinitis due to pollen 04/10/2024   Anxiety 04/10/2024   Atherosclerosis of aorta 04/10/2024   Benign prostatic hyperplasia with lower urinary tract symptoms 04/10/2024   Erectile dysfunction 04/10/2024   Hyperlipidemia 04/10/2024   Major depression in partial remission 04/10/2024   Obstructive sleep apnea 04/10/2024   Vitamin D  deficiency 04/10/2024   History of hearing loss 05/06/2023   Adenovirus infection 10/31/2021   GERD (gastroesophageal reflux disease) 10/30/2021   Right lower lobe pneumonia 10/29/2021   DM2 (diabetes mellitus, type 2) (HCC) 10/29/2021   Generalized weakness 10/29/2021   Falls 10/29/2021   Hyponatremia 10/29/2021   COPD (chronic obstructive pulmonary disease) (HCC) 10/29/2021   HTN (hypertension) 10/29/2021   Macular rash 10/29/2021   Encounter for orthopedic follow-up care 08/03/2019   Pain due to onychomycosis of toenails of both feet 03/17/2019   Osteoarthritis of carpometacarpal (CMC) joint of thumb 12/06/2017   Pain in thumb joint with movement of left hand 12/06/2017   Pain in thumb joint with movement of right hand  12/06/2017    PCP: Montie Pizza, MD   REFERRING PROVIDER: Lauraine Golden, MD  REFERRING DIAG: [phone] (ICD-10-CM) - Squamous cell carcinoma of head and neck region  THERAPY DIAG:  Stiffness of left shoulder, not elsewhere classified  Acute pain of left shoulder  Abnormal posture  Squamous cell carcinoma of head and neck  Rationale for Evaluation and Treatment: Rehabilitation  ONSET DATE: 07/05/24  SUBJECTIVE:     SUBJECTIVE STATEMENT: My Lt shoulder has been bothering me more since the surgery.    PERTINENT HISTORY:  Squamous cell of  the left neck with unknown primary, stage 1 (T0 N1 M0 p 16 +). 04/13/24 He presented to his PCP with left sided cervical lymphadenopathy at the left angle of the jaw.  He was treated with antibiotics and also tested positive for COVID. 05/08/24 CT neck demonstrated a 4.5 x 3.4 cm ovoid soft lesion within the left upper neck (at the level 2 station) favoring an enlarged lymph node, and completely effacing the left internal jugular vein within the mid/upper neck. 05/19/24 PET demonstrated an oval-shaped soft tissue mass in the left neck at the left level 2A nodal position measuring 2.8 x 3.5 cm with an SUV max of 14.1. Imaging otherwise showed no additional areas of hypermetabolism in the neck or chest that would suggest a primary site.  He was referred by his PCP to our Rapid Diagnostic clinic at [facility] who ordered a biopsy which was done on 05/31/24 showing findings consistent with SCC, p 16 +. 06/07/24 He was seen by Dr. Tobie (ENT) and he recommended proceeding with DL/Bx first along with tonsillectomy. This was performed on 06/14/24 with negative biopsies. After discussing with Dr. Tobie he elected to undergo neck dissection. 07/05/24 Left modified radical neck dissection showing SCC without extra nodal extension by Dr. Tobie. He will proceed with radiation alone. 06/29/24 PET showed no vocal cord primary or hypermetabolic metastases identified. Scan also noted a focus of hypermetabolism either along the capsule of the caudate lobe or an adjacent diminutive node is felt unlikely to represent metastatic disease. Given history of multiple primaries, this could be further evaluated with pre-and postcontrast abdominal MRI. 07/04/24 Consult with Dr. Golden. He will receive radiation alone to his vocal cord. He also had dental extractions with Dr. Danial on 07/24/24. Teeth # 15 and 18. He will receive 30 fractions of radiation to his Oropharynx and bilateral neck which started on 08/14/24 and complete 09/25/24.  07/05/24 Left modified radical neck dissection  PATIENT GOALS:   to be educated about the signs and symptoms of lymphedema and learn post op HEP.   PAIN:  Are you having pain? Yes: NPRS scale: 6/10 Pain location: throat Pain description: L side Aggravating factors: when throat gets dry, talking, and swallowing Relieving factors: lidocaine , new Healios mouth wash Dr. Golden suggested  PRECAUTIONS: Active CA  RED FLAGS: None   WEIGHT BEARING RESTRICTIONS: No  FALLS:  Has patient fallen in last 6 months? No Does the patient have a fear of falling that limits activity? No Is the patient reluctant to leave the house due to a fear of falling?No  LIVING ENVIRONMENT: Patient lives with: wife Comer Lives in: House/apartment Has following equipment at home: handle on side of tub  OCCUPATION: retired  LEISURE: walking prior to diagnosis  PRIOR LEVEL OF FUNCTION: Independent   OBJECTIVE: Note: Objective measures were completed at Evaluation unless otherwise noted.  COGNITION: Overall cognitive status: Within functional limits for tasks assessed  POSTURE:  Forward head and rounded shoulders posture  30 SEC SIT TO STAND: 11 reps in 30 sec without use of UEs which is  Below average for patient's age  SHOULDER AROM:   Impaired  L shoulder ROM secondary to neck dissection surgery   CERVICAL AROM:   Percent limited  Flexion WFL  Extension 25% limited (pt reports this is baseline)  Right lateral flexion 50% limited  Left lateral flexion 50% limited  Right rotation 50% limited  Left rotation 50% limited     (Blank rows=not tested)  SHOULDER AROM:     Degrees 09/04/24   Right Left  Flexion 138 105  Abduction 141 91  Extension 62 59  External Rotation 85 78  Internal Rotation 78 46   TREATMENT/DATE:   09/04/24: Therapeutic Exercises and Self Care A/ROM of bil shoulders taken for baseline Pulleys into flex and abd x 3 mins each with tactile and  VC's with demo throughout to instruct in proper technique/decreasing scapular compensation [place] with red theraband for Lt UE x 10 reps returning therapist demo and VC's during to technique, handout issued Spent time during exercises and between sets explaining correct GH and scapular rhythm and how posture plays into this as well answering pts questions throughout.   Manual Therapy P/ROM to Lt shoulder in supine into flex, abd and D2 with scapular depression throughout and VC's to relax during stretching dude to muscle guarding STM with cocoa butter in Rt S/L to Lt UT and medial scapular border, latent trigger point noted in UT that is ttp but this did seem to feel less tender to pt after session Scap Mobs in Rt S/L into Lt protraction and retraction    PATIENT EDUCATION:  Education details: [place] to Lt UE and showed daughter how to order over the door shoulder pulleys on [employer] for home use Person educated: Patient and daughter Education method: Explanation, Demonstration, Handout Education comprehension: Patient verbalized understanding, tactile and VC's for technique, and returned demonstration, will benefit from further review   HOME EXERCISE PROGRAM: 08/31/24 - Patient was instructed today in a home exercise program today for head and neck range of motion exercises. These included active cervical flexion, active cervical extension, active cervical rotation to each direction, upper trap stretch, and shoulder retraction. Patient was encouraged to do these 2-3 times a day, holding for 5 sec each and completing for 5 reps. Pt was educated that once this becomes easier then hold the stretches for 30-60 seconds.   09/04/24 - [place] to LT UE and pulleys for home use (daughter to order)  ASSESSMENT:  CLINICAL IMPRESSION: Pt arrives to PT clinic for first session of Lt shoulder ROM and strength. Focused on education of correct GH and scapular rhythm with A/AA/ROM exercises done in clinic  today. He demonstrated good awareness of correcting scapular rhythm. Also added [place] with red theraband for strengthening which he did very well returning correct demo after cuing. Then ended session with manual therapy which he reports felt very beneficial and decreasing Lt shoulder tightness. He does report that he will need to limit his visits due to high copay.   Pt will benefit from skilled therapeutic intervention to improve on the following deficits: Decreased knowledge of precautions and postural dysfunction. Other deficits: decreased knowledge of condition, decreased knowledge of use of DME, decreased ROM, decreased strength, increased fascial restrictions, impaired UE functional use, postural dysfunction, and pain  PT treatment/interventions: ADL/self-care home management, pt/family education, therapeutic exercise. Other interventions [id number]- PT  Re-evaluation, 97750- Physical Performance Testing, 97110-Therapeutic exercises, 97530- Therapeutic activity, [id number]- Neuromuscular re-education, 97535- Self Care, [id number]- Manual therapy, 97760- Orthotic Initial, [phone]- Orthotic/Prosthetic subsequent, and Patient/Family education  REHAB POTENTIAL: Good  CLINICAL DECISION MAKING: Evolving/moderate complexity  EVALUATION COMPLEXITY: Moderate   GOALS: Goals reviewed with patient? YES  LONG TERM GOALS: (STG=LTG)   Name Target Date  Goal status  1 Patient will be able to verbalize understanding of a home exercise program for cervical range of motion, posture, and walking.   Baseline:  No knowledge 08/31/2024 Achieved at eval  2 Patient will be able to verbalize understanding of proper sitting and standing posture. Baseline:  No knowledge 08/31/2024 Achieved at eval  3 Patient will be able to verbalize understanding of lymphedema risk and availability of treatment for this condition Baseline:  No knowledge 08/31/2024 Achieved at eval  4 Pt will demonstrate a return to full cervical ROM and  function post operatively compared to baselines and not demonstrate any signs or symptoms of lymphedema.  Baseline: See objective measurements taken today. 09/28/24 New  5 Pt will demonstrate 155 degrees of L shoulder abduction to allow him to reach out to the side. 09/28/24  NEW  6 Pt will be independent in a home exercise program for continued stretching  and strengthening.  09/28/24 NEW    PLAN:  PT FREQUENCY/DURATION: 2x/wk for 4 wks  PLAN FOR NEXT SESSION: Will need to limit visits due to high copay. Cont to work on [facility] rhythm and review exercises, do sidelying exercises - may need over pressure to stabilize scapular, and cont manual therapy prn to decrease Lt upper quadrant tightness.    Berwyn Knights, PTA 09/04/24 1:32 PM   [facility] [address], [address], [place], [place] [address] [phone]   Strengthening: Resisted Flexion    Cancer Rehab [phone]    Hold tubing with left arm at side. Pull forward and up. Move shoulder through pain-free range of motion. Repeat _5-10___ times per set. Do _1-2___ sessions per day.  Strengthening: Resisted Internal Rotation    Hold tubing in left hand, elbow at side and forearm out. Rotate forearm in across body. Repeat _5-10___ times per set. Do _1-2___ sessions per day.  Strengthening: Resisted Extension    Hold tubing in left hand, arm forward. Pull arm back, elbow straight. Repeat __5-10__ times per set. Do __1-2__ sessions per day.   Strengthening: Resisted External Rotation    Hold tubing in left hand, elbow at side and forearm across body. Rotate forearm out. Repeat _5-10___ times per set. Do __1-2__ sessions per day.                         "

## 2024-09-05 ENCOUNTER — Other Ambulatory Visit: Payer: Self-pay

## 2024-09-05 ENCOUNTER — Other Ambulatory Visit: Payer: Self-pay | Admitting: Radiation Oncology

## 2024-09-05 ENCOUNTER — Ambulatory Visit
Admission: RE | Admit: 2024-09-05 | Discharge: 2024-09-05 | Disposition: A | Source: Ambulatory Visit | Attending: Radiation Oncology | Admitting: Radiation Oncology

## 2024-09-05 ENCOUNTER — Ambulatory Visit
Admission: RE | Admit: 2024-09-05 | Discharge: 2024-09-05 | Disposition: A | Source: Ambulatory Visit | Attending: Radiation Oncology

## 2024-09-05 ENCOUNTER — Encounter (HOSPITAL_COMMUNITY): Payer: Self-pay

## 2024-09-05 DIAGNOSIS — Z510 Encounter for antineoplastic radiation therapy: Secondary | ICD-10-CM | POA: Diagnosis not present

## 2024-09-05 DIAGNOSIS — C4492 Squamous cell carcinoma of skin, unspecified: Secondary | ICD-10-CM

## 2024-09-05 DIAGNOSIS — C4442 Squamous cell carcinoma of skin of scalp and neck: Secondary | ICD-10-CM

## 2024-09-05 LAB — BASIC METABOLIC PANEL - CANCER CENTER ONLY
Anion gap: 12 (ref 5–15)
BUN: 18 mg/dL (ref 8–23)
CO2: 27 mmol/L (ref 22–32)
Calcium: 10 mg/dL (ref 8.9–10.3)
Chloride: 100 mmol/L (ref 98–111)
Creatinine: 0.97 mg/dL (ref 0.61–1.24)
GFR, Estimated: >60 mL/min
Glucose, Bld: 154 mg/dL — ABNORMAL HIGH (ref 70–99)
Potassium: 4.3 mmol/L (ref 3.5–5.1)
Sodium: 139 mmol/L (ref 135–145)

## 2024-09-05 LAB — RAD ONC ARIA SESSION SUMMARY
Course Elapsed Days: 22
Plan Fractions Treated to Date: 16
Plan Prescribed Dose Per Fraction: 2 Gy
Plan Total Fractions Prescribed: 30
Plan Total Prescribed Dose: 60 Gy
Reference Point Dosage Given to Date: 32 Gy
Reference Point Session Dosage Given: 2 Gy
Session Number: 16

## 2024-09-05 MED ORDER — HYDROCODONE-ACETAMINOPHEN 7.5-325 MG/15ML PO SOLN: 10.0000 mL | ORAL | 0 refills | Status: DC | PRN

## 2024-09-05 NOTE — Progress Notes (Signed)
"   RE: GTUBE REVIEW Received: Today Hughes Simmonds, MD  Tommie Riggs PROCEDURE REVIEW Date: 09/05/24  Requested Procedure site: Gastrostomy tube Reason for request: Dysphagia. Hx H&N CA Imaging review: I reviewed all pertinent diagnostic studies, including; PET CT  Decision: Approved Imaging modality to perform: Fluoroscopy Schedule with: Moderate Sedation Schedule for: Any VIR  Additional comments: @Schedulers . IR Percutaneous G-tube Placement. Pls organize for Pt to pick up PO contrast for colon opacification, to drink the evening / night before the scheduled procedure the next day. Can ke same contrast that CT provides their Pts for PO CTs  Please contact me with questions, concerns, or if issue pertaining to this request arise.  Simmonds Hughes, MD [facility] "

## 2024-09-06 ENCOUNTER — Other Ambulatory Visit: Payer: Self-pay

## 2024-09-06 ENCOUNTER — Ambulatory Visit
Admission: RE | Admit: 2024-09-06 | Discharge: 2024-09-06 | Disposition: A | Source: Ambulatory Visit | Attending: Radiation Oncology

## 2024-09-06 DIAGNOSIS — Z510 Encounter for antineoplastic radiation therapy: Secondary | ICD-10-CM | POA: Diagnosis not present

## 2024-09-06 LAB — RAD ONC ARIA SESSION SUMMARY
Course Elapsed Days: 23
Plan Fractions Treated to Date: 17
Plan Prescribed Dose Per Fraction: 2 Gy
Plan Total Fractions Prescribed: 30
Plan Total Prescribed Dose: 60 Gy
Reference Point Dosage Given to Date: 34 Gy
Reference Point Session Dosage Given: 2 Gy
Session Number: 17

## 2024-09-07 ENCOUNTER — Ambulatory Visit
Admission: RE | Admit: 2024-09-07 | Discharge: 2024-09-07 | Disposition: A | Discharge status: Home / Self Care | Source: Ambulatory Visit | Attending: Radiation Oncology | Admitting: Radiation Oncology

## 2024-09-07 ENCOUNTER — Telehealth: Payer: Self-pay

## 2024-09-07 ENCOUNTER — Other Ambulatory Visit: Payer: Self-pay

## 2024-09-07 ENCOUNTER — Inpatient Hospital Stay: Admitting: Nutrition

## 2024-09-07 DIAGNOSIS — Z510 Encounter for antineoplastic radiation therapy: Secondary | ICD-10-CM | POA: Diagnosis not present

## 2024-09-07 LAB — RAD ONC ARIA SESSION SUMMARY
Course Elapsed Days: 24
Plan Fractions Treated to Date: 18
Plan Prescribed Dose Per Fraction: 2 Gy
Plan Total Fractions Prescribed: 30
Plan Total Prescribed Dose: 60 Gy
Reference Point Dosage Given to Date: 36 Gy
Reference Point Session Dosage Given: 2 Gy
Session Number: 18

## 2024-09-07 NOTE — Telephone Encounter (Addendum)
 [facility] CSW Progress Note  Patient met with [facility] Dietician and reported questions and concerns about medical bills. CSW contacted patient on this date to discuss further. Patient stated he was denied financial assistance due to him owing copays. Patient unable to locate letter but stated it was from [place]. CSW messaged representative with [facility] for further clarification. CSW will update patient once guidance is received.    Lizbeth Sprague, LCSW Clinical Social Worker [facility]    Addendum: CSW informed that patient is not eligible for [facility] Financial Assistance due to his insurance covering all charges but his copay for ED and outpatient services (Debt Mitigation Program Policy);. CSW relayed information to patient. Encouraged patient to call billing for specifics related to charges. [facility] not appropriate due to time it takes for approval which would be past final treatment. No other resources known to CSW.

## 2024-09-07 NOTE — Progress Notes (Signed)
 Patient and wife present to nutrition follow-up.  Patient is receiving radiation therapy for SCC of the head and neck with unknown primary, p16 positive.  He is followed by Dr. Izell and Dr. Autumn.    Final radiation therapy is scheduled for February 9.  G-tube scheduled for February 2  Labs include glucose 154 on January 20.  Estimated nutrition needs: 2300-2600 cal, 136-156 g protein, greater than 2.5 L fluid.  Weight decreased to 198.4 pounds from 202.2 pounds 1 week ago.  Patient has lost 11% body weight in less than 3 months which is clinically significant.  Patient weighed 222 pounds October 29.  Dietary recall reveals patient is only eating bites of food throughout the day totaling about 500 cal.  Expect total protein is 20 g or less based on 24-hour recall.  Patient continues to complain of taste alterations and mouth sores.  He is tired of drinking oral nutrition supplements and states he cannot even stand to drink a carton of chocolate Ensure anymore.  He ate 1 to 2 tablespoons of macaroni and cheese, 1-2 bites of [place] steak, 1 small slice of canned peaches, 2 tablespoons of noodles of noodles, and almost 1 full carton of Ensure Plus.  Wife is providing a variety of foods and trying to add calories and protein.    Patient is very worried about his finances and his co-pays.  States he is not eligible for help to pay his bills.  His daughter purchased Helios for him and he states he is using it twice daily.  States this seems to be the 1 thing that is helping.  He is using baking soda and salt water gargle.  Nutrition diagnosis: Unintended weight loss continues.  Intervention: Educated regarding estimation of current calories consumed compared to estimation of energy needs. Recommended patient use all the tools and strategies provided to him such as baking soda salt water gargles, pain medication, and Helios to help his oral intake. Provided additional samples of oral nutrition  supplements that patient may like better than Ensure Plus. Recommended patient follow through with feeding tube placement. Referral to social worker to explore additional help. Education provided on importance of attending speech therapy appointment. Provided empathetic listening and support.  Monitoring, evaluation, goals: Patient will increase oral intake to minimize weight loss.  Next visit: Thursday, January 29, after radiation treatment.  **Disclaimer: This note was dictated with voice recognition software. Similar sounding words can inadvertently be transcribed and this note may contain transcription errors which may not have been corrected upon publication of note.**

## 2024-09-08 ENCOUNTER — Ambulatory Visit

## 2024-09-08 ENCOUNTER — Other Ambulatory Visit: Payer: Self-pay

## 2024-09-08 ENCOUNTER — Ambulatory Visit: Admission: RE | Admit: 2024-09-08 | Source: Ambulatory Visit

## 2024-09-08 ENCOUNTER — Inpatient Hospital Stay

## 2024-09-08 DIAGNOSIS — C7989 Secondary malignant neoplasm of other specified sites: Secondary | ICD-10-CM

## 2024-09-08 DIAGNOSIS — C4442 Squamous cell carcinoma of skin of scalp and neck: Secondary | ICD-10-CM

## 2024-09-08 DIAGNOSIS — R293 Abnormal posture: Secondary | ICD-10-CM

## 2024-09-08 DIAGNOSIS — M25612 Stiffness of left shoulder, not elsewhere classified: Secondary | ICD-10-CM

## 2024-09-08 DIAGNOSIS — M25512 Pain in left shoulder: Secondary | ICD-10-CM

## 2024-09-08 DIAGNOSIS — R1310 Dysphagia, unspecified: Secondary | ICD-10-CM | POA: Diagnosis not present

## 2024-09-08 DIAGNOSIS — C760 Malignant neoplasm of head, face and neck: Secondary | ICD-10-CM

## 2024-09-08 DIAGNOSIS — Z510 Encounter for antineoplastic radiation therapy: Secondary | ICD-10-CM | POA: Diagnosis not present

## 2024-09-08 LAB — RAD ONC ARIA SESSION SUMMARY
Course Elapsed Days: 25
Plan Fractions Treated to Date: 19
Plan Prescribed Dose Per Fraction: 2 Gy
Plan Total Fractions Prescribed: 30
Plan Total Prescribed Dose: 60 Gy
Reference Point Dosage Given to Date: 38 Gy
Reference Point Session Dosage Given: 2 Gy
Session Number: 19

## 2024-09-08 MED ORDER — SODIUM CHLORIDE 0.9 % IV SOLN: INTRAVENOUS | Status: AC

## 2024-09-08 MED ORDER — SODIUM CHLORIDE 0.9 % IV SOLN
Freq: Once | INTRAVENOUS | Status: DC
  Filled 2024-09-08: qty 250

## 2024-09-08 NOTE — Therapy (Signed)
 " OUTPATIENT PHYSICAL THERAPY HEAD AND NECK TREATMENT   Patient Name: Howard Simmons MRN: [id number] DOB:02-Jan-1951, 74 y.o., male Today's Date: 09/08/2024  END OF SESSION:  PT End of Session - 09/08/24 1140     Visit Number 3    Number of Visits 9    Date for Recertification  09/28/24    PT Start Time 1106   pt arrived a few mins late and had to leave early for another appt   PT Stop Time 1139    PT Time Calculation (min) 33 min    Activity Tolerance Patient tolerated treatment well    Behavior During Therapy [facility] for tasks assessed/performed          Past Medical History:  Diagnosis Date   Anxiety 04/13/2024   Cancer (HCC) 05/2024   squamous cell of head and neck region, unknown primary   COPD (chronic obstructive pulmonary disease) (HCC) 04/07/2022   Depression 04/28/2024   Diabetes mellitus without complication (HCC) 01/13/2024   type 2   ED (erectile dysfunction)    on cialis   GERD (gastroesophageal reflux disease)    Hyperlipidemia    Hypertension    Pneumonia 10/29/2021   right lower lobe   Seasonal allergies    Sleep apnea    mild , does not use cpap   Past Surgical History:  Procedure Laterality Date   COLON SURGERY     COLONOSCOPY WITH PROPOFOL  N/A 12/02/2015   Procedure: COLONOSCOPY WITH PROPOFOL ;  Surgeon: Gladis MARLA Louder, MD;  Location: [facility];  Service: Endoscopy;  Laterality: N/A;   DIRECT LARYNGOSCOPY N/A 06/14/2024   Procedure: LARYNGOSCOPY, DIRECT,  WITH BIOPSY;  Surgeon: Tobie Eldora NOVAK, MD;  Location: [facility] OR;  Service: ENT;  Laterality: N/A;   EYE SURGERY Bilateral    cataracts   IR US  GUIDE BX ASP/DRAIN  05/31/2024   RADICAL NECK DISSECTION Left 07/05/2024   Procedure: DISSECTION, NECK, RADICAL;  Surgeon: Tobie Eldora NOVAK, MD;  Location: [facility] OR;  Service: ENT;  Laterality: Left;   TONSILLECTOMY Bilateral 06/14/2024   Procedure: TONSILLECTOMY;  Surgeon: Tobie Eldora NOVAK, MD;  Location: [facility] OR;  Service: ENT;  Laterality: Bilateral;    Patient Active Problem List   Diagnosis Date Noted   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) 07/06/2024   Status post neck dissection 07/05/2024   Squamous cell carcinoma of head and neck 06/14/2024   Head and neck cancer (HCC) 06/11/2024   Squamous cell carcinoma of head and neck region 06/09/2024   Allergic rhinitis due to pollen 04/10/2024   Anxiety 04/10/2024   Atherosclerosis of aorta 04/10/2024   Benign prostatic hyperplasia with lower urinary tract symptoms 04/10/2024   Erectile dysfunction 04/10/2024   Hyperlipidemia 04/10/2024   Major depression in partial remission 04/10/2024   Obstructive sleep apnea 04/10/2024   Vitamin D  deficiency 04/10/2024   History of hearing loss 05/06/2023   Adenovirus infection 10/31/2021   GERD (gastroesophageal reflux disease) 10/30/2021   Right lower lobe pneumonia 10/29/2021   DM2 (diabetes mellitus, type 2) (HCC) 10/29/2021   Generalized weakness 10/29/2021   Falls 10/29/2021   Hyponatremia 10/29/2021   COPD (chronic obstructive pulmonary disease) (HCC) 10/29/2021   HTN (hypertension) 10/29/2021   Macular rash 10/29/2021   Encounter for orthopedic follow-up care 08/03/2019   Pain due to onychomycosis of toenails of both feet 03/17/2019   Osteoarthritis of carpometacarpal (CMC) joint of thumb 12/06/2017   Pain in thumb joint with movement  of left hand 12/06/2017   Pain in thumb joint with movement of right hand 12/06/2017    PCP: Montie Pizza, MD   REFERRING PROVIDER: Lauraine Golden, MD  REFERRING DIAG: [phone] (ICD-10-CM) - Squamous cell carcinoma of head and neck region  THERAPY DIAG:  Stiffness of left shoulder, not elsewhere classified  Acute pain of left shoulder  Abnormal posture  Squamous cell carcinoma of head and neck  Rationale for Evaluation and Treatment: Rehabilitation  ONSET DATE: 07/05/24  SUBJECTIVE:     SUBJECTIVE STATEMENT: Since I've been hear it's been  determined that I need a feeding tube so that's scheduled for 1/30. They also just told me today that I'm dehydrated and I need fluids by IV so I'll be heading back over to the cancer center later today. It's a lot. My Lt shoulder though felt so much better after I was here last time. The pain was almost gone for a few days.   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 04/13/24 He presented to his PCP with left sided cervical lymphadenopathy at the left angle of the jaw.  He was treated with antibiotics and also tested positive for COVID. 05/08/24 CT neck demonstrated a 4.5 x 3.4 cm ovoid soft lesion within the left upper neck (at the level 2 station) favoring an enlarged lymph node, and completely effacing the left internal jugular vein within the mid/upper neck. 05/19/24 PET demonstrated an oval-shaped soft tissue mass in the left neck at the left level 2A nodal position measuring 2.8 x 3.5 cm with an SUV max of 14.1. Imaging otherwise showed no additional areas of hypermetabolism in the neck or chest that would suggest a primary site.  He was referred by his PCP to our Rapid Diagnostic clinic at [facility] who ordered a biopsy which was done on 05/31/24 showing findings consistent with SCC, p 16 +. 06/07/24 He was seen by Dr. Tobie (ENT) and he recommended proceeding with DL/Bx first along with tonsillectomy. This was performed on 06/14/24 with negative biopsies. After discussing with Dr. Tobie he elected to undergo neck dissection. 07/05/24 Left modified radical neck dissection showing SCC without extra nodal extension by Dr. Tobie. He will proceed with radiation alone. 06/29/24 PET showed no vocal cord primary or hypermetabolic metastases identified. Scan also noted a focus of hypermetabolism either along the capsule of the caudate lobe or an adjacent diminutive node is felt unlikely to represent metastatic disease. Given history of multiple  primaries, this could be further evaluated with pre-and postcontrast abdominal MRI. 07/04/24 Consult with Dr. Golden. He will receive radiation alone to his vocal cord. He also had dental extractions with Dr. Danial on 07/24/24. Teeth # 15 and 18. He will receive 30 fractions of radiation to his Oropharynx and bilateral neck which started on 08/14/24 and complete 09/25/24. 07/05/24 Left modified radical neck dissection  PATIENT GOALS:   to be educated about the signs and symptoms of lymphedema and learn post op HEP.   PAIN:  Are you having pain? Yes: NPRS scale: 6-8/10 Pain location: throat Pain description: feels like razor blades when I swallow Aggravating factors: when throat gets dry, talking, and swallowing Relieving factors: lidocaine , new Healios mouth wash Dr. Golden suggested  PRECAUTIONS: Active CA  RED FLAGS: None   WEIGHT BEARING RESTRICTIONS: No  FALLS:  Has patient fallen in last 6 months? No Does the patient have a fear of falling that  limits activity? No Is the patient reluctant to leave the house due to a fear of falling?No  LIVING ENVIRONMENT: Patient lives with: wife Comer Lives in: House/apartment Has following equipment at home: handle on side of tub  OCCUPATION: retired  LEISURE: walking prior to diagnosis  PRIOR LEVEL OF FUNCTION: Independent   OBJECTIVE: Note: Objective measures were completed at Evaluation unless otherwise noted.  COGNITION: Overall cognitive status: Within functional limits for tasks assessed                  POSTURE:  Forward head and rounded shoulders posture  30 SEC SIT TO STAND: 11 reps in 30 sec without use of UEs which is  Below average for patient's age  SHOULDER AROM:   Impaired  L shoulder ROM secondary to neck dissection surgery   CERVICAL AROM:   Percent limited  Flexion WFL  Extension 25% limited (pt reports this is baseline)  Right lateral flexion 50% limited  Left lateral flexion 50% limited  Right  rotation 50% limited  Left rotation 50% limited     (Blank rows=not tested)  SHOULDER AROM:     Degrees 09/04/24   Right Left  Flexion 138 105  Abduction 141 91  Extension 62 59  External Rotation 85 78  Internal Rotation 78 46   TREATMENT/DATE:   09/08/24: Manual Therapy P/ROM to Lt shoulder in supine into flex, abd and D2 with scapular depression throughout and VC's to relax during stretching dude to muscle guarding but slightly improved today STM with cocoa butter in Rt S/L to Lt UT and medial scapular border, latent trigger point noted in UT that is ttp but some improved since last session Scap Mobs in Rt S/L into Lt protraction and retraction   09/04/24: Therapeutic Exercises and Self Care A/ROM of bil shoulders taken for baseline Pulleys into flex and abd x 3 mins each with tactile and VC's with demo throughout to instruct in proper technique/decreasing scapular compensation [place] with red theraband for Lt UE x 10 reps returning therapist demo and VC's during to technique, handout issued Spent time during exercises and between sets explaining correct GH and scapular rhythm and how posture plays into this as well answering pts questions throughout.   Manual Therapy P/ROM to Lt shoulder in supine into flex, abd and D2 with scapular depression throughout and VC's to relax during stretching dude to muscle guarding STM with cocoa butter in Rt S/L to Lt UT and medial scapular border, latent trigger point noted in UT that is ttp but this did seem to feel less tender to pt after session Scap Mobs in Rt S/L into Lt protraction and retraction    PATIENT EDUCATION:  Education details: [place] to Lt UE and showed daughter how to order over the door shoulder pulleys on [employer] for home use Person educated: Patient and daughter Education method: Explanation, Demonstration, Handout Education comprehension: Patient verbalized understanding, tactile and VC's for technique, and returned  demonstration, will benefit from further review   HOME EXERCISE PROGRAM: 08/31/24 - Patient was instructed today in a home exercise program today for head and neck range of motion exercises. These included active cervical flexion, active cervical extension, active cervical rotation to each direction, upper trap stretch, and shoulder retraction. Patient was encouraged to do these 2-3 times a day, holding for 5 sec each and completing for 5 reps. Pt was educated that once this becomes easier then hold the stretches for 30-60 seconds.   09/04/24 - [place]  to LT UE and pulleys for home use (daughter to order)  ASSESSMENT:  CLINICAL IMPRESSION: Pt was limited on time due to other medical appts today. So with limited time and him being weaker in general as of late due to limited nutrional options until he gets his feeding tube, just focused on manual therapy to Lt upper quadrant since he had such great relief last time. He repotrs pain was reduced to 2/10 after last session but today is back up to 5/10. He reports feeling greatly improved after session again.   Pt will benefit from skilled therapeutic intervention to improve on the following deficits: Decreased knowledge of precautions and postural dysfunction. Other deficits: decreased knowledge of condition, decreased knowledge of use of DME, decreased ROM, decreased strength, increased fascial restrictions, impaired UE functional use, postural dysfunction, and pain  PT treatment/interventions: ADL/self-care home management, pt/family education, therapeutic exercise. Other interventions 97164- PT Re-evaluation, 97750- Physical Performance Testing, 97110-Therapeutic exercises, 97530- Therapeutic activity, [id number]- Neuromuscular re-education, 97535- Self Care, [id number]- Manual therapy, 97760- Orthotic Initial, [phone]- Orthotic/Prosthetic subsequent, and Patient/Family education  REHAB POTENTIAL: Good  CLINICAL DECISION MAKING: Evolving/moderate  complexity  EVALUATION COMPLEXITY: Moderate   GOALS: Goals reviewed with patient? YES  LONG TERM GOALS: (STG=LTG)   Name Target Date  Goal status  1 Patient will be able to verbalize understanding of a home exercise program for cervical range of motion, posture, and walking.   Baseline:  No knowledge 08/31/2024 Achieved at eval  2 Patient will be able to verbalize understanding of proper sitting and standing posture. Baseline:  No knowledge 08/31/2024 Achieved at eval  3 Patient will be able to verbalize understanding of lymphedema risk and availability of treatment for this condition Baseline:  No knowledge 08/31/2024 Achieved at eval  4 Pt will demonstrate a return to full cervical ROM and function post operatively compared to baselines and not demonstrate any signs or symptoms of lymphedema.  Baseline: See objective measurements taken today. 09/28/24 New  5 Pt will demonstrate 155 degrees of L shoulder abduction to allow him to reach out to the side. 09/28/24  NEW  6 Pt will be independent in a home exercise program for continued stretching  and strengthening.  09/28/24 NEW    PLAN:  PT FREQUENCY/DURATION: 2x/wk for 4 wks  PLAN FOR NEXT SESSION: Will need to limit visits due to high copay. Cont to work on [facility] rhythm and review exercises, do sidelying exercises - may need over pressure to stabilize scapular, and cont manual therapy prn to decrease Lt upper quadrant tightness.    Berwyn Knights, PTA 09/08/24 11:48 AM   [facility] [address], [place], [place] [address] [phone]   Strengthening: Resisted Flexion    Cancer Rehab [phone]    Hold tubing with left arm at side. Pull forward and up. Move shoulder through pain-free range of motion. Repeat _5-10___ times per set. Do _1-2___ sessions per day.  Strengthening: Resisted Internal Rotation    Hold tubing in left hand, elbow at side and forearm out. Rotate forearm in across  body. Repeat _5-10___ times per set. Do _1-2___ sessions per day.  Strengthening: Resisted Extension    Hold tubing in left hand, arm forward. Pull arm back, elbow straight. Repeat __5-10__ times per set. Do __1-2__ sessions per day.   Strengthening: Resisted External Rotation    Hold tubing in left hand, elbow at side and forearm across body. Rotate forearm out. Repeat _5-10___ times per set. Do __1-2__ sessions per  day.                         "

## 2024-09-08 NOTE — Patient Instructions (Signed)

## 2024-09-09 ENCOUNTER — Telehealth: Payer: Self-pay | Admitting: Oncology

## 2024-09-09 NOTE — Telephone Encounter (Signed)
 I spoke with patient and he is aware of IVF infusion before radiation on 09/12/2024 at 7:30 am.

## 2024-09-11 ENCOUNTER — Ambulatory Visit

## 2024-09-11 ENCOUNTER — Telehealth: Payer: Self-pay | Admitting: Radiation Oncology

## 2024-09-11 NOTE — Telephone Encounter (Signed)
 Rescheduled appointment due to the cancer centr opening up at 10a. Talked with the patient and he is aware of the changes made to his upcoming appointment.

## 2024-09-12 ENCOUNTER — Other Ambulatory Visit: Payer: Self-pay | Admitting: Radiation Oncology

## 2024-09-12 ENCOUNTER — Ambulatory Visit
Admission: RE | Admit: 2024-09-12 | Discharge: 2024-09-12 | Disposition: A | Source: Ambulatory Visit | Attending: Radiation Oncology

## 2024-09-12 ENCOUNTER — Encounter: Payer: Self-pay | Admitting: Radiation Oncology

## 2024-09-12 ENCOUNTER — Ambulatory Visit

## 2024-09-12 ENCOUNTER — Inpatient Hospital Stay

## 2024-09-12 ENCOUNTER — Other Ambulatory Visit: Payer: Self-pay

## 2024-09-12 DIAGNOSIS — C7989 Secondary malignant neoplasm of other specified sites: Secondary | ICD-10-CM

## 2024-09-12 LAB — RAD ONC ARIA SESSION SUMMARY
Course Elapsed Days: 29
Plan Fractions Treated to Date: 20
Plan Prescribed Dose Per Fraction: 2 Gy
Plan Total Fractions Prescribed: 30
Plan Total Prescribed Dose: 60 Gy
Reference Point Dosage Given to Date: 40 Gy
Reference Point Session Dosage Given: 0.4636 Gy
Session Number: 20

## 2024-09-12 MED ORDER — HYDROCODONE-ACETAMINOPHEN 7.5-325 MG/15ML PO SOLN: 10.0000 mL | ORAL | 0 refills | Status: AC | PRN

## 2024-09-12 MED ORDER — SODIUM CHLORIDE 0.9 % IV SOLN: INTRAVENOUS | Status: AC

## 2024-09-12 NOTE — Progress Notes (Signed)
 Oncology Nurse Navigator Documentation   Met with Mr. Justiniano and his wife to provide PEG education prior to 09/15/24 placement.  Using  PEG teaching device and Teach Back, provided education for PEG use and care, including: hand hygiene, gravity bolus administration of daily water flushes and nutritional supplement, fluids and medications; care of tube insertion site including daily dressing change and cleaning; S&S of infection.   Mr. Francisco and his wife correctly verbalized procedures for gravity administration of water, dressing change and site care.  I provided written instructions for PEG flushing/dressing change in support of verbal instruction.   I provided/described contents of Start of Care Bolus Feeding Kit (2 60 cc syringes, 1 box 4x4 drainage sponges, 1 package mesh briefs, 1 roll paper tape, 1 case Osmolite 1.5).  He voiced understanding he is to start using Osmolite per guidance of Nutrition. He understands I will be available for ongoing PEG support. Provided barium sulfate prep which I obtained from [facility] and reviewed instructions.    Delon Jefferson RN, BSN, OCN Head & Neck Oncology Nurse Navigator [facility] Phone # [phone]  Fax # [phone]

## 2024-09-12 NOTE — Progress Notes (Signed)
"  °  Based on my assessment today the patient is having severe difficulty forcing himself to eat by mouth.  He is having taste changes and lack of appetite.  He continues to lose weight.  He has mucositis that is also affecting his oral intake.  At this point I think that a PEG feeding tube is medically necessary.  The patient is amenable to proceed with insertion.  -----------------------------------  Lauraine Golden, MD   "

## 2024-09-13 ENCOUNTER — Other Ambulatory Visit: Payer: Self-pay

## 2024-09-13 ENCOUNTER — Ambulatory Visit

## 2024-09-13 ENCOUNTER — Ambulatory Visit
Admission: RE | Admit: 2024-09-13 | Discharge: 2024-09-13 | Disposition: A | Source: Ambulatory Visit | Attending: Radiation Oncology

## 2024-09-13 DIAGNOSIS — M25512 Pain in left shoulder: Secondary | ICD-10-CM

## 2024-09-13 DIAGNOSIS — M25612 Stiffness of left shoulder, not elsewhere classified: Secondary | ICD-10-CM

## 2024-09-13 DIAGNOSIS — R1310 Dysphagia, unspecified: Secondary | ICD-10-CM | POA: Diagnosis not present

## 2024-09-13 DIAGNOSIS — R293 Abnormal posture: Secondary | ICD-10-CM

## 2024-09-13 DIAGNOSIS — C4442 Squamous cell carcinoma of skin of scalp and neck: Secondary | ICD-10-CM

## 2024-09-13 LAB — RAD ONC ARIA SESSION SUMMARY
Course Elapsed Days: 30
Plan Fractions Treated to Date: 21
Plan Prescribed Dose Per Fraction: 2 Gy
Plan Total Fractions Prescribed: 30
Plan Total Prescribed Dose: 60 Gy
Reference Point Dosage Given to Date: 42 Gy
Reference Point Session Dosage Given: 2 Gy
Session Number: 21

## 2024-09-13 NOTE — Therapy (Signed)
 " OUTPATIENT PHYSICAL THERAPY HEAD AND NECK TREATMENT   Patient Name: Howard Simmons MRN: [id number] DOB:September 02, 1950, 74 y.o., male Today's Date: 09/13/2024  END OF SESSION:  PT End of Session - 09/13/24 1108     Visit Number 4    Number of Visits 9    Date for Recertification  09/28/24    PT Start Time 1104    PT Stop Time 1159    PT Time Calculation (min) 55 min    Activity Tolerance Patient tolerated treatment well    Behavior During Therapy [facility] for tasks assessed/performed          Past Medical History:  Diagnosis Date   Anxiety 04/13/2024   Cancer (HCC) 05/2024   squamous cell of head and neck region, unknown primary   COPD (chronic obstructive pulmonary disease) (HCC) 04/07/2022   Depression 04/28/2024   Diabetes mellitus without complication (HCC) 01/13/2024   type 2   ED (erectile dysfunction)    on cialis   GERD (gastroesophageal reflux disease)    Hyperlipidemia    Hypertension    Pneumonia 10/29/2021   right lower lobe   Seasonal allergies    Sleep apnea    mild , does not use cpap   Past Surgical History:  Procedure Laterality Date   COLON SURGERY     COLONOSCOPY WITH PROPOFOL  N/A 12/02/2015   Procedure: COLONOSCOPY WITH PROPOFOL ;  Surgeon: Gladis MARLA Louder, MD;  Location: [facility];  Service: Endoscopy;  Laterality: N/A;   DIRECT LARYNGOSCOPY N/A 06/14/2024   Procedure: LARYNGOSCOPY, DIRECT,  WITH BIOPSY;  Surgeon: Tobie Eldora NOVAK, MD;  Location: [facility] OR;  Service: ENT;  Laterality: N/A;   EYE SURGERY Bilateral    cataracts   IR US  GUIDE BX ASP/DRAIN  05/31/2024   RADICAL NECK DISSECTION Left 07/05/2024   Procedure: DISSECTION, NECK, RADICAL;  Surgeon: Tobie Eldora NOVAK, MD;  Location: [facility] OR;  Service: ENT;  Laterality: Left;   TONSILLECTOMY Bilateral 06/14/2024   Procedure: TONSILLECTOMY;  Surgeon: Tobie Eldora NOVAK, MD;  Location: [facility] OR;  Service: ENT;  Laterality: Bilateral;   Patient Active Problem List   Diagnosis Date Noted   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 (HCC) 07/06/2024   Status post neck dissection 07/05/2024   Squamous cell carcinoma of head and neck 06/14/2024   Head and neck cancer (HCC) 06/11/2024   Squamous cell carcinoma of head and neck region 06/09/2024   Allergic rhinitis due to pollen 04/10/2024   Anxiety 04/10/2024   Atherosclerosis of aorta 04/10/2024   Benign prostatic hyperplasia with lower urinary tract symptoms 04/10/2024   Erectile dysfunction 04/10/2024   Hyperlipidemia 04/10/2024   Major depression in partial remission 04/10/2024   Obstructive sleep apnea 04/10/2024   Vitamin D  deficiency 04/10/2024   History of hearing loss 05/06/2023   Adenovirus infection 10/31/2021   GERD (gastroesophageal reflux disease) 10/30/2021   Right lower lobe pneumonia 10/29/2021   DM2 (diabetes mellitus, type 2) (HCC) 10/29/2021   Generalized weakness 10/29/2021   Falls 10/29/2021   Hyponatremia 10/29/2021   COPD (chronic obstructive pulmonary disease) (HCC) 10/29/2021   HTN (hypertension) 10/29/2021   Macular rash 10/29/2021   Encounter for orthopedic follow-up care 08/03/2019   Pain due to onychomycosis of toenails of both feet 03/17/2019   Osteoarthritis of carpometacarpal (CMC) joint of thumb 12/06/2017   Pain in thumb joint with movement of left hand 12/06/2017   Pain in thumb joint with movement of right hand  12/06/2017    PCP: Montie Pizza, MD   REFERRING PROVIDER: Lauraine Golden, MD  REFERRING DIAG: [phone] (ICD-10-CM) - Squamous cell carcinoma of head and neck region  THERAPY DIAG:  Stiffness of left shoulder, not elsewhere classified  Acute pain of left shoulder  Abnormal posture  Squamous cell carcinoma of head and neck  Rationale for Evaluation and Treatment: Rehabilitation  ONSET DATE: 07/05/24  SUBJECTIVE:     SUBJECTIVE STATEMENT: I get my feeding tube Friday. I haven't been able to eat or drink hardly anything. I feel so weak. But  my shoulder has been feeling great since you've started working on it.   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 04/13/24 He presented to his PCP with left sided cervical lymphadenopathy at the left angle of the jaw.  He was treated with antibiotics and also tested positive for COVID. 05/08/24 CT neck demonstrated a 4.5 x 3.4 cm ovoid soft lesion within the left upper neck (at the level 2 station) favoring an enlarged lymph node, and completely effacing the left internal jugular vein within the mid/upper neck. 05/19/24 PET demonstrated an oval-shaped soft tissue mass in the left neck at the left level 2A nodal position measuring 2.8 x 3.5 cm with an SUV max of 14.1. Imaging otherwise showed no additional areas of hypermetabolism in the neck or chest that would suggest a primary site.  He was referred by his PCP to our Rapid Diagnostic clinic at [facility] who ordered a biopsy which was done on 05/31/24 showing findings consistent with SCC, p 16 +. 06/07/24 He was seen by Dr. Tobie (ENT) and he recommended proceeding with DL/Bx first along with tonsillectomy. This was performed on 06/14/24 with negative biopsies. After discussing with Dr. Tobie he elected to undergo neck dissection. 07/05/24 Left modified radical neck dissection showing SCC without extra nodal extension by Dr. Tobie. He will proceed with radiation alone. 06/29/24 PET showed no vocal cord primary or hypermetabolic metastases identified. Scan also noted a focus of hypermetabolism either along the capsule of the caudate lobe or an adjacent diminutive node is felt unlikely to represent metastatic disease. Given history of multiple primaries, this could be further evaluated with pre-and postcontrast abdominal MRI. 07/04/24 Consult with Dr. Golden. He will receive radiation alone to his vocal cord. He also had dental extractions with Dr. Danial on 07/24/24. Teeth # 15 and 18. He will  receive 30 fractions of radiation to his Oropharynx and bilateral neck which started on 08/14/24 and complete 09/25/24. 07/05/24 Left modified radical neck dissection  PATIENT GOALS:   to be educated about the signs and symptoms of lymphedema and learn post op HEP.   PAIN:  Are you having pain? Yes: NPRS scale: 6-8/10 Pain location: throat Pain description: feels like razor blades when I swallow Aggravating factors: when throat gets dry, talking, and swallowing Relieving factors: lidocaine , trying to swallow soft things but that only helps for a brief time  PRECAUTIONS: Active CA  RED FLAGS: None   WEIGHT BEARING RESTRICTIONS: No  FALLS:  Has patient fallen in last 6 months? No Does the patient have a fear of falling that limits activity? No Is the patient reluctant to leave the house due to a fear of falling?No  LIVING ENVIRONMENT: Patient lives with: wife Comer Lives in: House/apartment Has following equipment at home: handle on side of tub  OCCUPATION: retired  LEISURE: walking prior to diagnosis  PRIOR LEVEL OF FUNCTION: Independent   OBJECTIVE: Note: Objective measures were completed at Evaluation unless otherwise noted.  COGNITION: Overall cognitive status: Within functional limits for tasks assessed                  POSTURE:  Forward head and rounded shoulders posture  30 SEC SIT TO STAND: 11 reps in 30 sec without use of UEs which is  Below average for patient's age  SHOULDER AROM:   Impaired  L shoulder ROM secondary to neck dissection surgery   CERVICAL AROM:   Percent limited  Flexion WFL  Extension 25% limited (pt reports this is baseline)  Right lateral flexion 50% limited  Left lateral flexion 50% limited  Right rotation 50% limited  Left rotation 50% limited     (Blank rows=not tested)  SHOULDER AROM:     Degrees 09/04/24   Right Left  Flexion 138 105  Abduction 141 91  Extension 62 59  External Rotation 85 78  Internal Rotation  78 46   TREATMENT/DATE:  09/13/24: Therapeutic Exercises Pulleys into flex x 3 min and abd x 2 min with VC's during to decrease Lt scapula compensation Self Care Spent time educating pt and wife about posture and how pt appears to hike Rt shoulder and with Lt shoulder dropped, more so since surgery but they also report he did this some before surgery. 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 Surgeon also explained his Lt UE will always be weaker now since tumor was wrapped around some of nerves that he had to cut leading to this weakness. Educated pt and wife that them being aware of it will help to reduce this some so he doesn't allow himself to lean into this posture as he becomes more fatigued throughout day. Also explained how the strengthening exs we do here will help to prevent this from worsening in the future.  Manual Therapy P/ROM to Lt shoulder in supine into flex, abd and D2 with scapular depression throughout and VC's to relax during stretching due to muscle guarding but conts to be slightly improved today STM with cocoa butter in Rt S/L to Lt UT and medial scapular border, latent trigger point noted in UT that is ttp Scap Mobs in Rt S/L into Lt protraction and retraction   09/08/24: Manual Therapy P/ROM to Lt shoulder in supine into flex, abd and D2 with scapular depression throughout and VC's to relax during stretching due to muscle guarding but slightly improved today STM with cocoa butter in Rt S/L to Lt UT and medial scapular border, latent trigger point noted in UT that is ttp but some improved since last session Scap Mobs in Rt S/L into Lt protraction and retraction   09/04/24: Therapeutic Exercises and Self Care A/ROM of bil shoulders taken for baseline Pulleys into flex and abd x 3 mins each with tactile and VC's with demo  throughout to instruct in proper technique/decreasing scapular compensation [place] with red theraband for Lt UE x 10 reps returning therapist demo and VC's during to technique, handout issued Spent time during exercises and between sets explaining correct GH and scapular rhythm and how posture plays into this as well answering pts questions throughout.   Manual Therapy P/ROM to Lt shoulder in supine into flex, abd and D2 with scapular depression throughout and  VC's to relax during stretching dude to muscle guarding STM with cocoa butter in Rt S/L to Lt UT and medial scapular border, latent trigger point noted in UT that is ttp but this did seem to feel less tender to pt after session Scap Mobs in Rt S/L into Lt protraction and retraction    PATIENT EDUCATION:  Education details: [place] to Lt UE and showed daughter how to order over the door shoulder pulleys on [employer] for home use Person educated: Patient and daughter Education method: Explanation, Demonstration, Handout Education comprehension: Patient verbalized understanding, tactile and VC's for technique, and returned demonstration, will benefit from further review   HOME EXERCISE PROGRAM: 08/31/24 - Patient was instructed today in a home exercise program today for head and neck range of motion exercises. These included active cervical flexion, active cervical extension, active cervical rotation to each direction, upper trap stretch, and shoulder retraction. Patient was encouraged to do these 2-3 times a day, holding for 5 sec each and completing for 5 reps. Pt was educated that once this becomes easier then hold the stretches for 30-60 seconds.   09/04/24 - [place] to LT UE and pulleys for home use (daughter to order)  ASSESSMENT:  CLINICAL IMPRESSION: Pt reports his Lt shoulder has been feeling much better since starting physical therapy as he feels he can raise it higher now with less discomfort. Spent time educating him and his wife  about importance of posture since his surgery, see above. Held on progressing strengthening exercises today as he is still very weak due to lack of nutrition from radiation but he gets a feeding tube placed Friday so he should be feeling some better by next week and able to resume gentle exercises. Continued with manual therapy as he is reporting good pain reduction and improved A/ROM from that.   Pt will benefit from skilled therapeutic intervention to improve on the following deficits: Decreased knowledge of precautions and postural dysfunction. Other deficits: decreased knowledge of condition, decreased knowledge of use of DME, decreased ROM, decreased strength, increased fascial restrictions, impaired UE functional use, postural dysfunction, and pain  PT treatment/interventions: ADL/self-care home management, pt/family education, therapeutic exercise. Other interventions 97164- PT Re-evaluation, 97750- Physical Performance Testing, 97110-Therapeutic exercises, 97530- Therapeutic activity, [id number]- Neuromuscular re-education, 97535- Self Care, [id number]- Manual therapy, 97760- Orthotic Initial, [phone]- Orthotic/Prosthetic subsequent, and Patient/Family education  REHAB POTENTIAL: Good  CLINICAL DECISION MAKING: Evolving/moderate complexity  EVALUATION COMPLEXITY: Moderate   GOALS: Goals reviewed with patient? YES  LONG TERM GOALS: (STG=LTG)   Name Target Date  Goal status  1 Patient will be able to verbalize understanding of a home exercise program for cervical range of motion, posture, and walking.   Baseline:  No knowledge 08/31/2024 Achieved at eval  2 Patient will be able to verbalize understanding of proper sitting and standing posture. Baseline:  No knowledge 08/31/2024 Achieved at eval  3 Patient will be able to verbalize understanding of lymphedema risk and availability of treatment for this condition Baseline:  No knowledge 08/31/2024 Achieved at eval  4 Pt will demonstrate a return to  full cervical ROM and function post operatively compared to baselines and not demonstrate any signs or symptoms of lymphedema.  Baseline: See objective measurements taken today. 09/28/24 New  5 Pt will demonstrate 155 degrees of L shoulder abduction to allow him to reach out to the side. 09/28/24  NEW  6 Pt will be independent in a home exercise program for continued stretching  and strengthening.  09/28/24  NEW    PLAN:  PT FREQUENCY/DURATION: 2x/wk for 4 wks  PLAN FOR NEXT SESSION: Will need to limit visits due to high copay. Cont to work on [facility] rhythm and review exercises, do sidelying exercises - may need over pressure to stabilize scapular, and cont manual therapy prn to decrease Lt upper quadrant tightness.    Berwyn Knights, PTA 09/13/24 1:18 PM   [facility] [address], [place], [place] [address] [phone]   Strengthening: Resisted Flexion    Cancer Rehab [phone]    Hold tubing with left arm at side. Pull forward and up. Move shoulder through pain-free range of motion. Repeat _5-10___ times per set. Do _1-2___ sessions per day.  Strengthening: Resisted Internal Rotation    Hold tubing in left hand, elbow at side and forearm out. Rotate forearm in across body. Repeat _5-10___ times per set. Do _1-2___ sessions per day.  Strengthening: Resisted Extension    Hold tubing in left hand, arm forward. Pull arm back, elbow straight. Repeat __5-10__ times per set. Do __1-2__ sessions per day.   Strengthening: Resisted External Rotation    Hold tubing in left hand, elbow at side and forearm across body. Rotate forearm out. Repeat _5-10___ times per set. Do __1-2__ sessions per day.  "

## 2024-09-14 ENCOUNTER — Ambulatory Visit
Admission: RE | Admit: 2024-09-14 | Discharge: 2024-09-14 | Disposition: A | Discharge status: Home / Self Care | Source: Ambulatory Visit | Attending: Radiation Oncology | Admitting: Radiation Oncology

## 2024-09-14 ENCOUNTER — Other Ambulatory Visit: Payer: Self-pay | Admitting: Radiology

## 2024-09-14 ENCOUNTER — Other Ambulatory Visit: Payer: Self-pay

## 2024-09-14 ENCOUNTER — Inpatient Hospital Stay: Admitting: Nutrition

## 2024-09-14 DIAGNOSIS — Z01812 Encounter for preprocedural laboratory examination: Secondary | ICD-10-CM

## 2024-09-14 DIAGNOSIS — C7989 Secondary malignant neoplasm of other specified sites: Secondary | ICD-10-CM

## 2024-09-14 DIAGNOSIS — C4442 Squamous cell carcinoma of skin of scalp and neck: Secondary | ICD-10-CM

## 2024-09-14 LAB — RAD ONC ARIA SESSION SUMMARY
Course Elapsed Days: 31
Plan Fractions Treated to Date: 22
Plan Prescribed Dose Per Fraction: 2 Gy
Plan Total Fractions Prescribed: 30
Plan Total Prescribed Dose: 60 Gy
Reference Point Dosage Given to Date: 44 Gy
Reference Point Session Dosage Given: 2 Gy
Session Number: 22

## 2024-09-14 MED ORDER — NUTREN 1.5 EN LIQD: 7.0000 | Freq: Every day | ENTERAL | Status: AC

## 2024-09-14 NOTE — Progress Notes (Signed)
 Nutrition follow-up completed with patient and wife after radiation therapy for SCC of the head and neck with unknown primary, P16 positive.  He is followed by Dr. Izell and Dr. Autumn. Final radiation therapy is scheduled for February 10.  G-tube scheduled for Friday, January 30. *Expect patient to require long-term nutrition support*  There are no new labs.  Weight: 196.4 pounds January 29 on RD scale 198.4 pounds January 22 208.6 pounds December 16 222 pounds October 29  *12% loss over 3 months/clinically significant.  Estimated nutrition needs:  2300-2600 cal, 136-156 g protein, greater than 2.5 L fluid.  Patient anxious about tube placement tomorrow.  Reports he drank 1 carton of ONS this morning.  He is still only eating bites of food at 1 time.  Noted patient required IV fluids on January 27.  Patient is using heliosis, baking soda and salt water gargles and lidocaine  regularly.  He sips on water throughout the day.  He is unable to increase oral intake due to dysgeusia and mucositis.  Nutrition diagnosis: Unintended weight loss continues.  Intervention: Reviewed in detail, tube feeding regimen which will begin on Saturday, January 31 after tube has been placed for a day.  Patient will begin with 1 carton Nutren 1.5 4 times daily with 120 mL liter free water flush before and after bolus feeding.  He will follow this scheduled for 3 days and advance as tolerated to 1-1/2 cartons 4 times daily on Tuesday, February 3.  If tolerated for 3 days patient will advance to goal rate of 1-1/2 cartons twice daily and 2 cartons twice daily on Friday, February 6 to provide 2625 cal, 119 g protein, and 1321 mL free water.  He will flush with an additional 180 mL of water twice daily to provide a total of 2641 mL water daily. Will evaluate need for additional protein after patient achieves goal rate. Provided written copy of tube feeding schedule.  Patient's wife able to repeat back  instructions. Ordered labs (C-Met, phosphorus, and magnesium ) twice daily to monitor for refeeding syndrome. MD to replete as needed.  Monitoring, evaluation, goals: Patient will tolerate increased calories and protein to minimize further weight loss and promote healing.  Next visit:  Thursday, February 5 after radiation therapy.  Patient encouraged to contact RD if he has questions prior to this visit.  **Disclaimer: This note was dictated with voice recognition software. Similar sounding words can inadvertently be transcribed and this note may contain transcription errors which may not have been corrected upon publication of note.**

## 2024-09-15 ENCOUNTER — Ambulatory Visit (HOSPITAL_COMMUNITY)
Admission: RE | Admit: 2024-09-15 | Discharge: 2024-09-15 | Disposition: A | Source: Ambulatory Visit | Attending: Radiation Oncology

## 2024-09-15 ENCOUNTER — Ambulatory Visit
Admission: RE | Admit: 2024-09-15 | Discharge: 2024-09-15 | Disposition: A | Source: Ambulatory Visit | Attending: Radiation Oncology

## 2024-09-15 ENCOUNTER — Other Ambulatory Visit: Payer: Self-pay

## 2024-09-15 ENCOUNTER — Ambulatory Visit (HOSPITAL_COMMUNITY)
Admission: RE | Admit: 2024-09-15 | Discharge: 2024-09-15 | Disposition: A | Source: Ambulatory Visit | Attending: Radiation Oncology | Admitting: Radiation Oncology

## 2024-09-15 ENCOUNTER — Ambulatory Visit

## 2024-09-15 DIAGNOSIS — C4492 Squamous cell carcinoma of skin, unspecified: Secondary | ICD-10-CM | POA: Insufficient documentation

## 2024-09-15 DIAGNOSIS — C7989 Secondary malignant neoplasm of other specified sites: Secondary | ICD-10-CM | POA: Diagnosis not present

## 2024-09-15 DIAGNOSIS — C4442 Squamous cell carcinoma of skin of scalp and neck: Secondary | ICD-10-CM | POA: Insufficient documentation

## 2024-09-15 DIAGNOSIS — R1310 Dysphagia, unspecified: Secondary | ICD-10-CM | POA: Insufficient documentation

## 2024-09-15 DIAGNOSIS — R634 Abnormal weight loss: Secondary | ICD-10-CM | POA: Insufficient documentation

## 2024-09-15 DIAGNOSIS — Z01812 Encounter for preprocedural laboratory examination: Secondary | ICD-10-CM | POA: Diagnosis not present

## 2024-09-15 DIAGNOSIS — Z87891 Personal history of nicotine dependence: Secondary | ICD-10-CM | POA: Diagnosis not present

## 2024-09-15 LAB — CBC
HCT: 44.8 % (ref 39.0–52.0)
Hemoglobin: 15 g/dL (ref 13.0–17.0)
MCH: 30.4 pg (ref 26.0–34.0)
MCHC: 33.5 g/dL (ref 30.0–36.0)
MCV: 90.7 fL (ref 80.0–100.0)
Platelets: 242 10*3/uL (ref 150–400)
RBC: 4.94 MIL/uL (ref 4.22–5.81)
RDW: 13.2 % (ref 11.5–15.5)
WBC: 4.5 10*3/uL (ref 4.0–10.5)
nRBC: 0 % (ref 0.0–0.2)

## 2024-09-15 LAB — RAD ONC ARIA SESSION SUMMARY
Course Elapsed Days: 32
Plan Fractions Treated to Date: 23
Plan Prescribed Dose Per Fraction: 2 Gy
Plan Total Fractions Prescribed: 30
Plan Total Prescribed Dose: 60 Gy
Reference Point Dosage Given to Date: 46 Gy
Reference Point Session Dosage Given: 2 Gy
Session Number: 23

## 2024-09-15 LAB — BASIC METABOLIC PANEL WITH GFR
Anion gap: 14 (ref 5–15)
BUN: 9 mg/dL (ref 8–23)
CO2: 26 mmol/L (ref 22–32)
Calcium: 9.5 mg/dL (ref 8.9–10.3)
Chloride: 100 mmol/L (ref 98–111)
Creatinine, Ser: 0.86 mg/dL (ref 0.61–1.24)
GFR, Estimated: >60 mL/min
Glucose, Bld: 127 mg/dL — ABNORMAL HIGH (ref 70–99)
Potassium: 3.9 mmol/L (ref 3.5–5.1)
Sodium: 140 mmol/L (ref 135–145)

## 2024-09-15 LAB — GLUCOSE, CAPILLARY: Glucose-Capillary: 157 mg/dL — ABNORMAL HIGH (ref 70–99)

## 2024-09-15 LAB — PROTIME-INR
INR: 1 (ref 0.8–1.2)
Prothrombin Time: 14.1 s (ref 11.4–15.2)

## 2024-09-15 MED ORDER — IOHEXOL 300 MG/ML  SOLN: 50.0000 mL | Freq: Once | INTRAMUSCULAR | Status: DC | PRN

## 2024-09-15 MED ORDER — LIDOCAINE HCL 1 % IJ SOLN
INTRAMUSCULAR | Status: AC
  Filled 2024-09-15: qty 20

## 2024-09-15 MED ORDER — SODIUM CHLORIDE 0.9 % IV SOLN: INTRAVENOUS | Status: DC

## 2024-09-15 MED ORDER — HYDROCODONE-ACETAMINOPHEN 5-325 MG PO TABS
1.0000 | ORAL_TABLET | ORAL | Status: DC | PRN
  Administered 2024-09-15: 1 via ORAL
  Filled 2024-09-15: qty 1

## 2024-09-15 MED ORDER — LIDOCAINE VISCOUS HCL 2 % MT SOLN
OROMUCOSAL | Status: AC
  Filled 2024-09-15: qty 15

## 2024-09-15 MED ORDER — LIDOCAINE HCL 1 % IJ SOLN
20.0000 mL | Freq: Once | INTRAMUSCULAR | Status: AC
  Administered 2024-09-15: 20 mL via INTRADERMAL

## 2024-09-15 MED ORDER — FENTANYL CITRATE (PF) 100 MCG/2ML IJ SOLN
INTRAMUSCULAR | Status: AC
  Filled 2024-09-15: qty 2

## 2024-09-15 MED ORDER — GLUCAGON HCL RDNA (DIAGNOSTIC) 1 MG IJ SOLR
INTRAMUSCULAR | Status: AC | PRN
  Administered 2024-09-15: .5 mg via INTRAVENOUS

## 2024-09-15 MED ORDER — MIDAZOLAM HCL (PF) 2 MG/2ML IJ SOLN
INTRAMUSCULAR | Status: AC | PRN
  Administered 2024-09-15 (×2): 1 mg via INTRAVENOUS

## 2024-09-15 MED ORDER — CEFAZOLIN SODIUM-DEXTROSE 2-4 GM/100ML-% IV SOLN
2.0000 g | Freq: Once | INTRAVENOUS | Status: AC
  Administered 2024-09-15: 2 g via INTRAVENOUS
  Filled 2024-09-15: qty 100

## 2024-09-15 MED ORDER — ONDANSETRON HCL 4 MG/2ML IJ SOLN: 4.0000 mg | INTRAMUSCULAR | Status: DC | PRN

## 2024-09-15 MED ORDER — GLUCAGON HCL RDNA (DIAGNOSTIC) 1 MG IJ SOLR
INTRAMUSCULAR | Status: AC
  Filled 2024-09-15: qty 1

## 2024-09-15 MED ORDER — FENTANYL CITRATE (PF) 100 MCG/2ML IJ SOLN
INTRAMUSCULAR | Status: AC | PRN
  Administered 2024-09-15: 50 ug via INTRAVENOUS

## 2024-09-15 MED ORDER — MIDAZOLAM HCL 2 MG/2ML IJ SOLN
INTRAMUSCULAR | Status: AC
  Filled 2024-09-15: qty 4

## 2024-09-15 NOTE — Procedures (Signed)
 Interventional Radiology Procedure Note  Procedure: Gastrostomy tube placement  Complications: None  Estimated Blood Loss: < 10 mL  Findings: 20 Fr bumper retention gastrostomy tube placed with tip in body of stomach. OK to use in 24 hours.  Jodi Marble. Fredia Sorrow, M.D Pager:  [phone]

## 2024-09-15 NOTE — Discharge Instructions (Signed)
 Discharge Instructions:   Please call Interventional Radiology clinic [phone] with any questions or concerns.  Keep the dressing over the gastrostomy site.  Take a bed bath tomorrow.Moderate Conscious Sedation, Adult, Care After This sheet gives you information about how to care for yourself after your procedure. Your health care provider may also give you more specific instructions. If you have problems or questions, contact your health care provider. What can I expect after the procedure? After the procedure, it is common to have: Sleepiness for several hours. Impaired judgment for several hours. Difficulty with balance. Vomiting if you eat too soon. Follow these instructions at home: For the time period you were told by your health care provider: Rest. Do not participate in activities where you could fall or become injured. Do not drive or use machinery. Do not drink alcohol. Do not take sleeping pills or medicines that cause drowsiness. Do not make important decisions or sign legal documents. Do not take care of children on your own. Eating and drinking  Follow the diet recommended by your health care provider. Drink enough fluid to keep your urine pale yellow. If you vomit: Drink water, juice, or soup when you can drink without vomiting. Make sure you have little or no nausea before eating solid foods. General instructions Take over-the-counter and prescription medicines only as told by your health care provider. Have a responsible adult stay with you for the time you are told. It is important to have someone help care for you until you are awake and alert. Do not smoke. Keep all follow-up visits as told by your health care provider. This is important. Contact a health care provider if: You are still sleepy or having trouble with balance after 24 hours. You feel light-headed. You keep feeling nauseous or you keep vomiting. You develop a rash. You have a fever. You  have redness or swelling around the IV site. Get help right away if: You have trouble breathing. You have new-onset confusion at home. Summary After the procedure, it is common to feel sleepy, have impaired judgment, or feel nauseous if you eat too soon. Rest after you get home. Know the things you should not do after the procedure. Follow the diet recommended by your health care provider and drink enough fluid to keep your urine pale yellow. Get help right away if you have trouble breathing or new-onset confusion at home. This information is not intended to replace advice given to you by your health care provider. Make sure you discuss any questions you have with your health care provider. Document Revised: 12/01/2019 Document Reviewed: 06/29/2019 Elsevier Patient Education  2023 [employer]    Gastrostomy Tube Home Guide, Adult  A gastrostomy tube, or G-tube, is a tube that is inserted through the abdomen into the stomach. The tube is used to give feedings and medicines when a person cannot eat and drink enough on his or her own or take medicines by mouth. How to care for the insertion site Washing hands with soap and water.  Supplies needed: Saline solution or clean, warm water and soap. Saline solution is made of salt and water. Cotton swab or gauze. Pre-cut gauze bandage (dressing) and tape, if needed. Instructions Follow these steps daily to clean the insertion site: Wash your hands with soap and water for at least 20 seconds. Remove the dressing (if there is one) that is between the person's skin and the tube. Check the area where the tube enters the skin. Check daily for  problems such as: Redness, rash, or irritation. Swelling. Pus-like drainage. Extra skin growth. Moisten the cotton swab or gauze with the saline solution or with a soap-and-water mixture. Gently clean around the insertion site. Remove any drainage or crusted material. When the G-tube is first put in, a  normal saline solution or water can be used to clean the skin. After the skin around the tube has healed, mild soap and water may be used. Apply a dressing (if there should be one) between the person's skin and the tube. How to flush a G-tube Flush the G-tube regularly to keep it from clogging. Flush it before and after feedings and as often as told by the health care provider. Supplies needed: Purified or germ-free (sterile) water, warmed. Container with lid for boiling water, if needed. 60 cc G-tube syringe. Instructions Before you begin, decide whether to use sterile water or purified drinking water. Use only sterile water if: The person has a weak disease-fighting (immune) system. The person has trouble fighting off infections (is immunocompromised). You are unsure about the amount of chemical contaminants in purified or drinking water. Use purified drinking water in all other cases. To purify drinking water by boiling: Boil water for at least 1 minute. Keep lid over water while it boils. Let water cool to room temperature before using. Follow these steps to flush the G-tube: Wash your hands with soap and water for at least 20 seconds. Bring out (draw up) 30 mL of warm water in a syringe. Connect the syringe to the tube. Slowly and gently push the water into the tube. General tips If the tube comes out: Cover the opening with a clean dressing and tape. Get help right away. If there is skin or scar tissue growing where the tube enters the skin: Keep the area clean and dry. Secure the tube with tape so that the tube does not move around too much. If the tube gets clogged: Slowly push warm water into the tube with a large syringe. Do not force the fluid into the tube or push an object into the tube. Get help right away if you cannot unclog the tube. Follow these instructions at home: Feedings Give feedings at room temperature. If feedings are continuous: Do not put more than 4  hours' worth of feedings in the feeding bag. Stop the feedings when you need to give medicine or flush the tube. Be sure to restart the feedings. Make sure the person's head is above his or her stomach (upright position). This will prevent choking and discomfort. Make sure the person is in the right position during and after feedings. During feedings, have the person in the upright position. After a non-continuous feeding (bolus feeding), have the person stay in the upright position for 1 hour. Cover and place unused feedings in the refrigerator. Replace feeding bags and syringes as told. Good hygiene Make sure the person takes good care of his or her mouth and teeth (oral hygiene), such as by brushing his or her teeth. Keep the area where the tube enters the skin clean and dry. General instructions Do not pull or put tension on the tube. Before you remove the tube cap or disconnect a syringe, close the tube by using a clamp (clamping) or bending (kinking) the tube. Measure the length of the G-tube every day from the insertion site to the end of the tube. If the person's G-tube has a balloon, check the fluid in the balloon every week. Check the manufacturer's specifications  to find the amount of fluid that should be in the balloon. Remove excess air from the G-tube as told. This is called venting. Do not push feedings, medicines, or flushes fast. Use the feeding tube equipment, such as syringes and connectors, only as told by your health care provider. Contact a health care provider if: The person with the tube has constipation or a fever. A large amount of fluid or mucus-like liquid is leaking from the tube. Skin or scar tissue appears to be growing where the tube enters the skin. The length of tube from the insertion site to the G-tube gets longer. Get help right away if: The person with the tube has any of these problems: Severe pain, tenderness, or bloating in the abdomen. Nausea or  vomiting. Trouble breathing or shortness of breath. Any of these problems happen in the area where the tube enters the skin: Redness, irritation, swelling, or soreness. Pus-like discharge. A bad smell. The tube is clogged and cannot be flushed. The tube comes out. The tube will need to be put back in within 4 hours. Summary A gastrostomy tube, or G-tube, is a tube that is inserted through the abdomen into the stomach. The tube is used to give feedings and medicines when a person cannot eat and drink enough on his or her own or cannot take medicine by mouth. Check and clean the insertion site daily as told by the person's health care provider. Flush the G-tube regularly to keep it from clogging. Flush it before and after feedings and as often as told. Keep the area where the tube enters the skin clean and dry. This information is not intended to replace advice given to you by your health care provider. Make sure you discuss any questions you have with your health care provider. Document Revised: 07/24/2020 Document Reviewed: 12/21/2019 Elsevier Patient Education  2023 [employer].

## 2024-09-18 ENCOUNTER — Ambulatory Visit

## 2024-09-18 ENCOUNTER — Ambulatory Visit (HOSPITAL_COMMUNITY)

## 2024-09-18 ENCOUNTER — Other Ambulatory Visit (HOSPITAL_COMMUNITY)

## 2024-09-19 ENCOUNTER — Ambulatory Visit

## 2024-09-19 ENCOUNTER — Inpatient Hospital Stay: Admitting: Nutrition

## 2024-09-19 ENCOUNTER — Other Ambulatory Visit: Payer: Self-pay

## 2024-09-19 ENCOUNTER — Ambulatory Visit
Admission: RE | Admit: 2024-09-19 | Discharge: 2024-09-19 | Disposition: A | Source: Ambulatory Visit | Attending: Radiation Oncology

## 2024-09-19 LAB — RAD ONC ARIA SESSION SUMMARY
Course Elapsed Days: 36
Plan Fractions Treated to Date: 24
Plan Prescribed Dose Per Fraction: 2 Gy
Plan Total Fractions Prescribed: 30
Plan Total Prescribed Dose: 60 Gy
Reference Point Dosage Given to Date: 48 Gy
Reference Point Session Dosage Given: 2 Gy
Session Number: 24

## 2024-09-19 NOTE — Progress Notes (Signed)
 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  He is followed by Dr. Izell and Dr. Autumn.  Final radiation therapy is scheduled for February 11.  20 French G-tube placed January 30 DME: [facility]  Labs include glucose 127 on January 30.  Refeeding labs pending.  Weight:  193 pounds February 3 196.43 pounds January 30 208.6 pounds December 16 222 pounds October 29  13% weight loss over 3 months  Estimated nutrition needs:  2300-2600 cal, 136-156 g protein, greater than 2.5 L fluid.  Patient complains of fatigue.  Reports his mouth and throat hurt.  He tried to eat some pudding last night but was unable.  Stated it felt like he was swallowing glass.  He can sip on water, coffee, and tea throughout the day.  He has increased burping since feeding tube placed.  Reports 1 or 2 soft stools a day.  Tube feeding of Nutren 1.5 advancing toward goal rate.  He has tolerated 1 carton 4 times daily the past 3 days.  Today he has increased to 1-1/2 cartons 4 times daily. Patient receives 120 mL of free water before and after bolus feeding 4 times a day. (960 mL)  Unable to assess refeeding.  Due to weather, patient unable to have labs drawn yesterday.    Nutrition diagnosis: Unintended weight loss, continues.  Intervention: Continue tube feeding advancement to goal rate as tolerated. Be sure to drink minimum of 12 ounces of water daily by mouth.  If unable to swallow water give 180 mL free water flush twice daily at 10 AM and 2 PM. Soft foods as tolerated.  Pain medication as prescribed. Continue PEG site care. Refeeding labs have been ordered through next week.  Monitoring, evaluation, goals: Patient will tolerate tube feeding advancement to meet greater than 90% estimated needs without evidence of refeeding.  Next visit: Thursday, February 5 after radiation therapy.  **Disclaimer: This note was  dictated with voice recognition software. Similar sounding words can inadvertently be transcribed and this note may contain transcription errors which may not have been corrected upon publication of note.**

## 2024-09-20 ENCOUNTER — Ambulatory Visit

## 2024-09-20 ENCOUNTER — Other Ambulatory Visit: Payer: Self-pay

## 2024-09-20 DIAGNOSIS — M25612 Stiffness of left shoulder, not elsewhere classified: Secondary | ICD-10-CM

## 2024-09-20 DIAGNOSIS — M25512 Pain in left shoulder: Secondary | ICD-10-CM

## 2024-09-20 DIAGNOSIS — R293 Abnormal posture: Secondary | ICD-10-CM

## 2024-09-20 DIAGNOSIS — C4442 Squamous cell carcinoma of skin of scalp and neck: Secondary | ICD-10-CM

## 2024-09-20 LAB — RAD ONC ARIA SESSION SUMMARY
Course Elapsed Days: 37
Plan Fractions Treated to Date: 25
Plan Prescribed Dose Per Fraction: 2 Gy
Plan Total Fractions Prescribed: 30
Plan Total Prescribed Dose: 60 Gy
Reference Point Dosage Given to Date: 50 Gy
Reference Point Session Dosage Given: 2 Gy
Session Number: 25

## 2024-09-20 NOTE — Patient Instructions (Addendum)
 SABRA

## 2024-09-20 NOTE — Therapy (Signed)
 " OUTPATIENT PHYSICAL THERAPY HEAD AND NECK TREATMENT   Patient Name: Howard Simmons MRN: [id number] DOB:Dec 23, 1950, 74 y.o., male Today's Date: 09/20/2024  END OF SESSION:  PT End of Session - 09/20/24 1113     Visit Number 5    Number of Visits 9    Date for Recertification  09/28/24    PT Start Time 1106    PT Stop Time 1200    PT Time Calculation (min) 54 min    Activity Tolerance Patient tolerated treatment well    Behavior During Therapy [facility] for tasks assessed/performed          Past Medical History:  Diagnosis Date   Anxiety 04/13/2024   Cancer (HCC) 05/2024   squamous cell of head and neck region, unknown primary   COPD (chronic obstructive pulmonary disease) (HCC) 04/07/2022   Depression 04/28/2024   Diabetes mellitus without complication (HCC) 01/13/2024   type 2   ED (erectile dysfunction)    on cialis   GERD (gastroesophageal reflux disease)    Hyperlipidemia    Hypertension    Pneumonia 10/29/2021   right lower lobe   Seasonal allergies    Sleep apnea    mild , does not use cpap   Past Surgical History:  Procedure Laterality Date   COLON SURGERY     COLONOSCOPY WITH PROPOFOL  N/A 12/02/2015   Procedure: COLONOSCOPY WITH PROPOFOL ;  Surgeon: Gladis MARLA Louder, MD;  Location: [facility];  Service: Endoscopy;  Laterality: N/A;   DIRECT LARYNGOSCOPY N/A 06/14/2024   Procedure: LARYNGOSCOPY, DIRECT,  WITH BIOPSY;  Surgeon: Tobie Eldora NOVAK, MD;  Location: [facility] OR;  Service: ENT;  Laterality: N/A;   EYE SURGERY Bilateral    cataracts   IR GASTROSTOMY TUBE MOD SED  09/15/2024   IR US  GUIDE BX ASP/DRAIN  05/31/2024   RADICAL NECK DISSECTION Left 07/05/2024   Procedure: DISSECTION, NECK, RADICAL;  Surgeon: Tobie Eldora NOVAK, MD;  Location: [facility] OR;  Service: ENT;  Laterality: Left;   TONSILLECTOMY Bilateral 06/14/2024   Procedure: TONSILLECTOMY;  Surgeon: Tobie Eldora NOVAK, MD;  Location: [facility] OR;  Service: ENT;  Laterality: Bilateral;   Patient Active Problem List    Diagnosis Date Noted   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) 07/06/2024   Status post neck dissection 07/05/2024   Squamous cell carcinoma of head and neck 06/14/2024   Head and neck cancer (HCC) 06/11/2024   Squamous cell carcinoma of head and neck region 06/09/2024   Allergic rhinitis due to pollen 04/10/2024   Anxiety 04/10/2024   Atherosclerosis of aorta 04/10/2024   Benign prostatic hyperplasia with lower urinary tract symptoms 04/10/2024   Erectile dysfunction 04/10/2024   Hyperlipidemia 04/10/2024   Major depression in partial remission 04/10/2024   Obstructive sleep apnea 04/10/2024   Vitamin D  deficiency 04/10/2024   History of hearing loss 05/06/2023   Adenovirus infection 10/31/2021   GERD (gastroesophageal reflux disease) 10/30/2021   Right lower lobe pneumonia 10/29/2021   DM2 (diabetes mellitus, type 2) (HCC) 10/29/2021   Generalized weakness 10/29/2021   Falls 10/29/2021   Hyponatremia 10/29/2021   COPD (chronic obstructive pulmonary disease) (HCC) 10/29/2021   HTN (hypertension) 10/29/2021   Macular rash 10/29/2021   Encounter for orthopedic follow-up care 08/03/2019   Pain due to onychomycosis of toenails of both feet 03/17/2019   Osteoarthritis of carpometacarpal (CMC) joint of thumb 12/06/2017   Pain in thumb joint with movement of left hand 12/06/2017  Pain in thumb joint with movement of right hand 12/06/2017    PCP: Montie Pizza, MD   REFERRING PROVIDER: Lauraine Golden, MD  REFERRING DIAG: [phone] (ICD-10-CM) - Squamous cell carcinoma of head and neck region  THERAPY DIAG:  Stiffness of left shoulder, not elsewhere classified  Acute pain of left shoulder  Abnormal posture  Squamous cell carcinoma of head and neck  Rationale for Evaluation and Treatment: Rehabilitation  ONSET DATE: 07/05/24  SUBJECTIVE:     SUBJECTIVE STATEMENT: I got my feeding tube Friday. I still feel like I have no energy and I  can't hardly eat anything my mouth anymore. I use the feeding tube as suggested, 4x/day right now. I need to stop coming for physical therapy for now due to high co pay. But my Lt shoulder is feeling a lot better since you've been working on it. And I do the HEP you gave me some.   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 04/13/24 He presented to his PCP with left sided cervical lymphadenopathy at the left angle of the jaw.  He was treated with antibiotics and also tested positive for COVID. 05/08/24 CT neck demonstrated a 4.5 x 3.4 cm ovoid soft lesion within the left upper neck (at the level 2 station) favoring an enlarged lymph node, and completely effacing the left internal jugular vein within the mid/upper neck. 05/19/24 PET demonstrated an oval-shaped soft tissue mass in the left neck at the left level 2A nodal position measuring 2.8 x 3.5 cm with an SUV max of 14.1. Imaging otherwise showed no additional areas of hypermetabolism in the neck or chest that would suggest a primary site.  He was referred by his PCP to our Rapid Diagnostic clinic at [facility] who ordered a biopsy which was done on 05/31/24 showing findings consistent with SCC, p 16 +. 06/07/24 He was seen by Dr. Tobie (ENT) and he recommended proceeding with DL/Bx first along with tonsillectomy. This was performed on 06/14/24 with negative biopsies. After discussing with Dr. Tobie he elected to undergo neck dissection. 07/05/24 Left modified radical neck dissection showing SCC without extra nodal extension by Dr. Tobie. He will proceed with radiation alone. 06/29/24 PET showed no vocal cord primary or hypermetabolic metastases identified. Scan also noted a focus of hypermetabolism either along the capsule of the caudate lobe or an adjacent diminutive node is felt unlikely to represent metastatic disease. Given history of multiple primaries, this could be further evaluated with  pre-and postcontrast abdominal MRI. 07/04/24 Consult with Dr. Golden. He will receive radiation alone to his vocal cord. He also had dental extractions with Dr. Danial on 07/24/24. Teeth # 15 and 18. He will receive 30 fractions of radiation to his Oropharynx and bilateral neck which started on 08/14/24 and complete 09/25/24. 07/05/24 Left modified radical neck dissection  PATIENT GOALS:   to be educated about the signs and symptoms of lymphedema and learn post op HEP.   PAIN:  Are you having pain? Yes, constant throat pain due to current radiation but Lt shoulder is feeling much better.   PRECAUTIONS: Active CA  RED FLAGS: None   WEIGHT BEARING RESTRICTIONS: No  FALLS:  Has patient fallen in last 6 months? No Does the patient have a fear of falling that limits activity? No Is the patient reluctant to leave the house due to a fear of falling?No  LIVING ENVIRONMENT: Patient lives with: wife  [place] Lives in: House/apartment Has following equipment at home: handle on side of tub  OCCUPATION: retired  LEISURE: walking prior to diagnosis  PRIOR LEVEL OF FUNCTION: Independent   OBJECTIVE: Note: Objective measures were completed at Evaluation unless otherwise noted.  COGNITION: Overall cognitive status: Within functional limits for tasks assessed                  POSTURE:  Forward head and rounded shoulders posture  30 SEC SIT TO STAND: 11 reps in 30 sec without use of UEs which is  Below average for patient's age  SHOULDER AROM:   Impaired  L shoulder ROM secondary to neck dissection surgery   CERVICAL AROM:   08/31/2024 09/20/2024   Percent limited Percent limited  Flexion [facility] WFL  Extension 25% limited (pt reports this is baseline) 25% limited   Right lateral flexion 50% limited 50% limited  Left lateral flexion 50% limited 25% limited  Right rotation 50% limited 50% limited  Left rotation 50% limited  25% limited    (Blank rows=not tested)  SHOULDER AROM:      Degrees 09/04/24 09/20/24   Right Left   Flexion 138 105 116  Abduction 141 91 121 slight scaption  Extension 62 59   External Rotation 85 78   Internal Rotation 78 46    TREATMENT/DATE:   09/20/24: Therapeutic Exercises Rt S/L for following: Lt shoulder er x 12, instructed that he can progress this with holding 1# (can of veggies or bottle of water) for progression Lt UE flex x 12 with PTA guiding motions initially to instruct in correct motion, then VC's during to remind of correct UE position during as pt required reminder. Wife present for instruction as well and was cued to help him with correct positioning at home.  Spent time explaining importance of continuing theraband exercises and then adding these into his day. Also demonstrated table slides into flex and abd to pt and wife and issued handout with all exercises done today for HEP.  A/ROM of Lt shoulder taken for goal assess Self Care Spent time educating pt on importance of being consistent with his Lt shoulder strength and flexibility exs as well as continuing his cervical A/ROM stretches. Pt is focused on how weak he feels right now and that his doctor told him it could take a year to fully recover. Reminded him that this means it could take a year to fully recover, not that he won't see improvement for a year which is what he seemed to be worried about. Also encouraged him that the more he starts gently doing exercises now, and start getting back to his daily walking, even if it's just for 5-10 mins at a time, 2-3x/day right now, will make a huge difference in his recovery. Pt seemed to feel a bit better by end of session after reinforcement of aforementioned.  Manual Therapy P/ROM to Lt shoulder in supine into flex, abd and D2 with scapular depression throughout and VC's to relax during stretching due to muscle guarding but less so today STM with cocoa butter in Rt S/L to Lt UT and medial scapular border, latent trigger point noted in  UT that is ttp and still present Scap Mobs in Rt S/L into Lt protraction and retraction, some improvement in scap mobility noted  09/13/24: Therapeutic Exercises Pulleys into flex x 3 min and abd x 2 min with VC's during to decrease Lt scapula compensation Self Care Spent time educating pt and wife  about posture and how pt appears to hike Rt shoulder and with Lt shoulder dropped, more so since surgery but they also report he did this some before surgery. 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 Surgeon also explained his Lt UE will always be weaker now since tumor was wrapped around some of nerves that he had to cut leading to this weakness. Educated pt and wife that them being aware of it will help to reduce this some so he doesn't allow himself to lean into this posture as he becomes more fatigued throughout day. Also explained how the strengthening exs we do here will help to prevent this from worsening in the future.  Manual Therapy P/ROM to Lt shoulder in supine into flex, abd and D2 with scapular depression throughout and VC's to relax during stretching due to muscle guarding but conts to be slightly improved today STM with cocoa butter in Rt S/L to Lt UT and medial scapular border, latent trigger point noted in UT that is ttp Scap Mobs in Rt S/L into Lt protraction and retraction   09/08/24: Manual Therapy P/ROM to Lt shoulder in supine into flex, abd and D2 with scapular depression throughout and VC's to relax during stretching due to muscle guarding but slightly improved today STM with cocoa butter in Rt S/L to Lt UT and medial scapular border, latent trigger point noted in UT that is ttp but some improved since last session Scap Mobs in Rt S/L into Lt protraction and retraction   09/04/24: Therapeutic Exercises and Self Care A/ROM of  bil shoulders taken for baseline Pulleys into flex and abd x 3 mins each with tactile and VC's with demo throughout to instruct in proper technique/decreasing scapular compensation [place] with red theraband for Lt UE x 10 reps returning therapist demo and VC's during to technique, handout issued Spent time during exercises and between sets explaining correct GH and scapular rhythm and how posture plays into this as well answering pts questions throughout.   Manual Therapy P/ROM to Lt shoulder in supine into flex, abd and D2 with scapular depression throughout and VC's to relax during stretching dude to muscle guarding STM with cocoa butter in Rt S/L to Lt UT and medial scapular border, latent trigger point noted in UT that is ttp but this did seem to feel less tender to pt after session Scap Mobs in Rt S/L into Lt protraction and retraction    PATIENT EDUCATION:  Education details: Lt shoulder/scapula strength in Rt S/L Person educated: Patient and wife Education method: Explanation, Demonstration, Handout Education comprehension: Patient verbalized understanding, tactile and VC's for technique, and returned demonstration  HOME EXERCISE PROGRAM: 08/31/24 - Patient was instructed today in a home exercise program today for head and neck range of motion exercises. These included active cervical flexion, active cervical extension, active cervical rotation to each direction, upper trap stretch, and shoulder retraction. Patient was encouraged to do these 2-3 times a day, holding for 5 sec each and completing for 5 reps. Pt was educated that once this becomes easier then hold the stretches for 30-60 seconds.   09/04/24 - [place] to Lt UE and pulleys for home use (daughter to order)  09/20/24 - Rt S/L for LT shoulder/scapular strength and table slides for easy Lt UE AA/ROM stretches, also resume daily walking starting with 5-10  mins, 2-3x/day as able  ASSESSMENT:  CLINICAL IMPRESSION: Pt conts to  report good improvements in Lt shoulder pain, mobility and posture since start of physical therapy. He requested D/C today due to high copay. Progressed HEP for Lt shoulder strength and flexibility, and spent time encouraging and educating pt on importance of consistency with HEP now to help with his recovery for future. Pt seemed more encouraged when he left today, goals were updated and we will D/C him at this time. Pt and wife, Comer, were also informed that if needed in the future he could return to physical therapy with new MD referral. They verbalized understanding.   Pt will benefit from skilled therapeutic intervention to improve on the following deficits: Decreased knowledge of precautions and postural dysfunction. Other deficits: decreased knowledge of condition, decreased knowledge of use of DME, decreased ROM, decreased strength, increased fascial restrictions, impaired UE functional use, postural dysfunction, and pain  PT treatment/interventions: ADL/self-care home management, pt/family education, therapeutic exercise. Other interventions [id number]- PT Re-evaluation, 97750- Physical Performance Testing, 97110-Therapeutic exercises, 97530- Therapeutic activity, [id number]- Neuromuscular re-education, 97535- Self Care, [id number]- Manual therapy, 97760- Orthotic Initial, [phone]- Orthotic/Prosthetic subsequent, and Patient/Family education  REHAB POTENTIAL: Good  CLINICAL DECISION MAKING: Evolving/moderate complexity  EVALUATION COMPLEXITY: Moderate   GOALS: Goals reviewed with patient? YES  LONG TERM GOALS: (STG=LTG)   Name Target Date  Goal status  1 Patient will be able to verbalize understanding of a home exercise program for cervical range of motion, posture, and walking.   Baseline:  No knowledge 08/31/2024 Achieved at eval  2 Patient will be able to verbalize understanding of proper sitting and standing posture. Baseline:  No knowledge 08/31/2024 Achieved at eval  3 Patient will be able  to verbalize understanding of lymphedema risk and availability of treatment for this condition Baseline:  No knowledge 08/31/2024 Achieved at eval  4 Pt will demonstrate a return to full cervical ROM and function post operatively compared to baselines and not demonstrate any signs or symptoms of lymphedema.  Baseline: See objective measurements taken today. 09/28/24 MET  5 Pt will demonstrate 155 degrees of L shoulder abduction to allow him to reach out to the side. 09/28/24  PARTIALLY MET  09/20/24 - 121 degrees  6 Pt will be independent in a home exercise program for continued stretching  and strengthening.  09/28/24 MET    PLAN:  PT FREQUENCY/DURATION: 2x/wk for 4 wks  PLAN FOR NEXT SESSION: D/C this visit.    Berwyn Knights, PTA 09/20/24 12:54 PM   [facility] [address], [place], [place] [address] [phone]   Strengthening: Resisted Flexion    Cancer Rehab [phone]    Hold tubing with left arm at side. Pull forward and up. Move shoulder through pain-free range of motion. Repeat _5-10___ times per set. Do _1-2___ sessions per day.  Strengthening: Resisted Internal Rotation    Hold tubing in left hand, elbow at side and forearm out. Rotate forearm in across body. Repeat _5-10___ times per set. Do _1-2___ sessions per day.  Strengthening: Resisted Extension    Hold tubing in left hand, arm forward. Pull arm back, elbow straight. Repeat __5-10__ times per set. Do __1-2__ sessions per day.   Strengthening: Resisted External Rotation    Hold tubing in left hand, elbow at side and forearm across body. Rotate forearm out. Repeat _5-10___ times per set. Do __1-2__ sessions per day.  Added 09/20/24: External Rotation: Side-Lying (Dumbbell)    Lie with neck supported,  left elbow bent to 90, forearm across stomach. Raise forearm, keeping elbow at side. Complete __1-2_ sets of __10_ repetitions. Use __1__ lb weight (use can of  soup or bottle of water).  Perform _2__ sessions per day.    FLEXION: Side-Lying - Elbow Extended Powder Board (Active - Assistance)    Lie on right side, top arm resting on board. Slide arm up above head, keeping elbow straight. Complete __1-2_ sets of __10_ repetitions. Perform _2__ sessions per day. Use __1__ lb weight.    Flexion (Passive)    Sitting upright, slide forearm forward along table, bending from the waist until a stretch is felt. Hold __5__ seconds. Then turn body away from table and stretch arm out to side.  Repeat __5__ times. Do __3__ sessions per day.   Cancer Rehab [phone]  PHYSICAL THERAPY DISCHARGE SUMMARY  Visits from Start of Care: 5  Current functional level related to goals / functional outcomes: See above   Remaining deficits: Still with limited shoulder ROM but improving and pt will continue HEP   Education / Equipment: HEP, lymphedema education, walking program, posture educaiton   Patient agrees to discharge. Patient goals were met. Patient is being discharged due to meeting the stated rehab goals.  "

## 2024-09-21 ENCOUNTER — Ambulatory Visit: Admission: RE | Admit: 2024-09-21 | Discharge: 2024-09-21 | Attending: Radiation Oncology

## 2024-09-21 ENCOUNTER — Inpatient Hospital Stay: Admitting: Nutrition

## 2024-09-21 ENCOUNTER — Other Ambulatory Visit: Payer: Self-pay

## 2024-09-21 ENCOUNTER — Ambulatory Visit
Admission: RE | Admit: 2024-09-21 | Discharge: 2024-09-21 | Disposition: A | Source: Ambulatory Visit | Attending: Radiation Oncology | Admitting: Radiation Oncology

## 2024-09-21 DIAGNOSIS — C4442 Squamous cell carcinoma of skin of scalp and neck: Secondary | ICD-10-CM

## 2024-09-21 DIAGNOSIS — C7989 Secondary malignant neoplasm of other specified sites: Secondary | ICD-10-CM

## 2024-09-21 LAB — CMP (CANCER CENTER ONLY)
ALT: 17 U/L (ref 0–44)
AST: 35 U/L (ref 15–41)
Albumin: 4.4 g/dL (ref 3.5–5.0)
Alkaline Phosphatase: 66 U/L (ref 38–126)
Anion gap: 11 (ref 5–15)
BUN: 13 mg/dL (ref 8–23)
CO2: 30 mmol/L (ref 22–32)
Calcium: 9.9 mg/dL (ref 8.9–10.3)
Chloride: 97 mmol/L — ABNORMAL LOW (ref 98–111)
Creatinine: 0.93 mg/dL (ref 0.61–1.24)
GFR, Estimated: >60 mL/min
Glucose, Bld: 240 mg/dL — ABNORMAL HIGH (ref 70–99)
Potassium: 4.3 mmol/L (ref 3.5–5.1)
Sodium: 138 mmol/L (ref 135–145)
Total Bilirubin: 0.4 mg/dL (ref 0.0–1.2)
Total Protein: 7.4 g/dL (ref 6.5–8.1)

## 2024-09-21 LAB — RAD ONC ARIA SESSION SUMMARY
Course Elapsed Days: 38
Plan Fractions Treated to Date: 26
Plan Prescribed Dose Per Fraction: 2 Gy
Plan Total Fractions Prescribed: 30
Plan Total Prescribed Dose: 60 Gy
Reference Point Dosage Given to Date: 52 Gy
Reference Point Session Dosage Given: 2 Gy
Session Number: 26

## 2024-09-21 LAB — PHOSPHORUS: Phosphorus: 3.3 mg/dL (ref 2.5–4.6)

## 2024-09-21 LAB — FERRITIN: Ferritin: 103 ng/mL (ref 24–336)

## 2024-09-21 LAB — MAGNESIUM: Magnesium: 2 mg/dL (ref 1.7–2.4)

## 2024-09-21 NOTE — Progress Notes (Signed)
 Nutrition follow-up completed with patient and wife.  Patient will complete final radiation therapy treatment for SCC of the head and neck with unknown primary, p16 positive on Wednesday, February 11.  He is followed by Dr. Izell and Dr. Autumn.  20 French G-tube placed January 30 DME: [facility]  Labs on February 5 include glucose 240, sodium 138, potassium 4.3, and magnesium  2.0.  Phosphorus pending.  Weight: 196.6 pounds on RD scale February 5 193 pounds February 3 196.43 pounds January 30 208.6 pounds December 16 222 pounds October 29  Estimated nutrition needs: 2300-2600 cal, 136-156 g protein, greater than 2.5 L fluid  Patient continues to complain of fatigue and sore mouth and throat.  He does not usually use his pain medication.  He is encouraged by his wife to perform mouth care twice a day.  He generally uses baking soda and salt water gargle, lidocaine , and heliosis.  Reports he could no longer tolerate solid food.  He continues to drink coffee and water.  Patient still reports loose stools twice a day.  Usually first stool is after breakfast and second stool is sometime late afternoon.  He is not consistent with taking Imodium.  Continues to have reflux.  Tube feeding has advanced to 1-1/2 cartons of Nutren 1.54 times daily with 120 mL free water before and after each bolus feeding.  Patient is on scheduled to advance tube feeding tomorrow to goal giving 2 cartons Nutren 1.5 twice daily and 1-1/2 cartons twice daily for total of 7 cartons daily.  He continues to drink a minimum of 12 ounces water (360 mL) throughout the day.  Wife reports they have plenty of tube feeding as they have received their full delivery.  They also have adequate supplies.  She continues to provide tube care with no concerns.  7 cartons Nutren 1.5 provides 2625 cal, 119 g protein and a total of 2641 mL free water.  Nutrition diagnosis: Unintended weight loss, stable.  Intervention: Continue tube feeding  advancing to goal rate as tolerated. Reviewed bowel regimen and encouraged patient to take Imodium as directed after each loose stool. Provided samples of Banatrol.  Recommended beginning with 1 packet of Banatrol daily. Continue water and other liquids by mouth as tolerated. Encourage patient to take pain medications if needed. Educated on strategies to improve reflux.  May need to consider medication. Continue to monitor for refeeding.  Monitoring, evaluation, goals: Patient will tolerate adequate calories and protein to minimize weight loss and promote healing.  Next visit:  Wednesday, February 11 after final radiation treatment.  **Disclaimer: This note was dictated with voice recognition software. Similar sounding words can inadvertently be transcribed and this note may contain transcription errors which may not have been corrected upon publication of note.**

## 2024-09-22 ENCOUNTER — Ambulatory Visit: Admission: RE | Admit: 2024-09-22

## 2024-09-22 ENCOUNTER — Other Ambulatory Visit: Payer: Self-pay

## 2024-09-22 ENCOUNTER — Ambulatory Visit

## 2024-09-22 LAB — RAD ONC ARIA SESSION SUMMARY
Course Elapsed Days: 39
Plan Fractions Treated to Date: 27
Plan Prescribed Dose Per Fraction: 2 Gy
Plan Total Fractions Prescribed: 30
Plan Total Prescribed Dose: 60 Gy
Reference Point Dosage Given to Date: 54 Gy
Reference Point Session Dosage Given: 2 Gy
Session Number: 27

## 2024-09-25 ENCOUNTER — Ambulatory Visit

## 2024-09-26 ENCOUNTER — Ambulatory Visit

## 2024-09-27 ENCOUNTER — Ambulatory Visit

## 2024-09-27 ENCOUNTER — Inpatient Hospital Stay: Admitting: Nutrition

## 2024-09-28 ENCOUNTER — Ambulatory Visit

## 2024-09-28 ENCOUNTER — Inpatient Hospital Stay: Admitting: Nutrition

## 2024-09-29 ENCOUNTER — Ambulatory Visit

## 2024-10-02 ENCOUNTER — Ambulatory Visit

## 2024-10-10 ENCOUNTER — Ambulatory Visit

## 2025-03-01 ENCOUNTER — Ambulatory Visit (INDEPENDENT_AMBULATORY_CARE_PROVIDER_SITE_OTHER): Admitting: Otolaryngology
